# Patient Record
Sex: Male | Born: 1970 | Race: Black or African American | Hispanic: No | Marital: Single | Smoking: Current every day smoker
Health system: Southern US, Community
[De-identification: ages and names within clinical notes are randomized; demographics above are authoritative.]

## PROBLEM LIST (undated history)

## (undated) DIAGNOSIS — Z8571 Personal history of Hodgkin lymphoma: Secondary | ICD-10-CM

## (undated) DIAGNOSIS — K219 Gastro-esophageal reflux disease without esophagitis: Secondary | ICD-10-CM

## (undated) DIAGNOSIS — E785 Hyperlipidemia, unspecified: Secondary | ICD-10-CM

## (undated) DIAGNOSIS — D869 Sarcoidosis, unspecified: Secondary | ICD-10-CM

## (undated) DIAGNOSIS — I10 Essential (primary) hypertension: Secondary | ICD-10-CM

## (undated) DIAGNOSIS — N529 Male erectile dysfunction, unspecified: Secondary | ICD-10-CM

## (undated) HISTORY — PX: HERNIA REPAIR: SHX51

## (undated) HISTORY — DX: Sarcoidosis, unspecified: D86.9

## (undated) HISTORY — PX: LUNG BIOPSY: SHX232

## (undated) HISTORY — DX: Hyperlipidemia, unspecified: E78.5

## (undated) HISTORY — DX: Essential (primary) hypertension: I10

## (undated) HISTORY — DX: Male erectile dysfunction, unspecified: N52.9

## (undated) HISTORY — PX: TONSILLECTOMY: SUR1361

## (undated) HISTORY — DX: Personal history of Hodgkin lymphoma: Z85.71

---

## 1998-02-01 ENCOUNTER — Emergency Department (HOSPITAL_COMMUNITY): Admission: EM | Admit: 1998-02-01 | Discharge: 1998-02-02 | Payer: Self-pay | Admitting: Emergency Medicine

## 1999-01-23 HISTORY — PX: OTHER SURGICAL HISTORY: SHX169

## 1999-03-22 ENCOUNTER — Encounter: Payer: Self-pay | Admitting: Emergency Medicine

## 1999-03-22 ENCOUNTER — Emergency Department (HOSPITAL_COMMUNITY): Admission: EM | Admit: 1999-03-22 | Discharge: 1999-03-22 | Payer: Self-pay | Admitting: Emergency Medicine

## 1999-03-31 ENCOUNTER — Ambulatory Visit (HOSPITAL_BASED_OUTPATIENT_CLINIC_OR_DEPARTMENT_OTHER): Admission: RE | Admit: 1999-03-31 | Discharge: 1999-03-31 | Payer: Self-pay | Admitting: General Surgery

## 1999-03-31 ENCOUNTER — Encounter (INDEPENDENT_AMBULATORY_CARE_PROVIDER_SITE_OTHER): Payer: Self-pay | Admitting: *Deleted

## 1999-04-10 ENCOUNTER — Encounter: Payer: Self-pay | Admitting: Hematology & Oncology

## 1999-04-10 ENCOUNTER — Ambulatory Visit (HOSPITAL_COMMUNITY): Admission: RE | Admit: 1999-04-10 | Discharge: 1999-04-10 | Payer: Self-pay | Admitting: Hematology & Oncology

## 1999-04-11 ENCOUNTER — Encounter: Admission: RE | Admit: 1999-04-11 | Discharge: 1999-04-11 | Payer: Self-pay | Admitting: Hematology & Oncology

## 1999-04-11 ENCOUNTER — Ambulatory Visit (HOSPITAL_COMMUNITY): Admission: RE | Admit: 1999-04-11 | Discharge: 1999-04-11 | Payer: Self-pay | Admitting: Hematology & Oncology

## 1999-04-11 ENCOUNTER — Encounter: Payer: Self-pay | Admitting: Hematology & Oncology

## 1999-04-14 ENCOUNTER — Ambulatory Visit (HOSPITAL_COMMUNITY): Admission: RE | Admit: 1999-04-14 | Discharge: 1999-04-14 | Payer: Self-pay | Admitting: Hematology & Oncology

## 1999-05-02 ENCOUNTER — Encounter: Payer: Self-pay | Admitting: Hematology & Oncology

## 1999-05-02 ENCOUNTER — Ambulatory Visit (HOSPITAL_COMMUNITY): Admission: RE | Admit: 1999-05-02 | Discharge: 1999-05-02 | Payer: Self-pay | Admitting: Hematology & Oncology

## 1999-05-27 ENCOUNTER — Encounter: Payer: Self-pay | Admitting: Emergency Medicine

## 1999-05-27 ENCOUNTER — Emergency Department (HOSPITAL_COMMUNITY): Admission: EM | Admit: 1999-05-27 | Discharge: 1999-05-27 | Payer: Self-pay | Admitting: Emergency Medicine

## 1999-06-11 ENCOUNTER — Inpatient Hospital Stay (HOSPITAL_COMMUNITY): Admission: EM | Admit: 1999-06-11 | Discharge: 1999-06-14 | Payer: Self-pay | Admitting: Emergency Medicine

## 1999-06-11 ENCOUNTER — Encounter: Payer: Self-pay | Admitting: Hematology and Oncology

## 1999-06-17 ENCOUNTER — Encounter: Payer: Self-pay | Admitting: Hematology & Oncology

## 1999-06-18 ENCOUNTER — Inpatient Hospital Stay (HOSPITAL_COMMUNITY): Admission: EM | Admit: 1999-06-18 | Discharge: 1999-06-28 | Payer: Self-pay

## 1999-06-23 ENCOUNTER — Encounter: Payer: Self-pay | Admitting: Hematology & Oncology

## 1999-06-28 ENCOUNTER — Encounter: Payer: Self-pay | Admitting: Hematology & Oncology

## 1999-07-05 ENCOUNTER — Encounter (INDEPENDENT_AMBULATORY_CARE_PROVIDER_SITE_OTHER): Payer: Self-pay | Admitting: *Deleted

## 1999-07-05 ENCOUNTER — Ambulatory Visit (HOSPITAL_COMMUNITY): Admission: RE | Admit: 1999-07-05 | Discharge: 1999-07-06 | Payer: Self-pay | Admitting: *Deleted

## 1999-07-15 ENCOUNTER — Emergency Department (HOSPITAL_COMMUNITY): Admission: EM | Admit: 1999-07-15 | Discharge: 1999-07-15 | Payer: Self-pay | Admitting: Emergency Medicine

## 1999-09-18 ENCOUNTER — Ambulatory Visit (HOSPITAL_COMMUNITY): Admission: RE | Admit: 1999-09-18 | Discharge: 1999-09-18 | Payer: Self-pay | Admitting: Hematology & Oncology

## 1999-09-18 ENCOUNTER — Encounter: Payer: Self-pay | Admitting: Hematology & Oncology

## 1999-09-26 ENCOUNTER — Inpatient Hospital Stay (HOSPITAL_COMMUNITY): Admission: AD | Admit: 1999-09-26 | Discharge: 1999-09-29 | Payer: Self-pay | Admitting: Oncology

## 1999-09-26 ENCOUNTER — Encounter: Payer: Self-pay | Admitting: Hematology & Oncology

## 1999-10-01 ENCOUNTER — Ambulatory Visit (HOSPITAL_COMMUNITY): Admission: RE | Admit: 1999-10-01 | Discharge: 1999-10-01 | Payer: Self-pay | Admitting: Hematology & Oncology

## 1999-10-04 ENCOUNTER — Encounter: Admission: RE | Admit: 1999-10-04 | Discharge: 2000-01-02 | Payer: Self-pay | Admitting: Radiation Oncology

## 1999-12-07 ENCOUNTER — Emergency Department (HOSPITAL_COMMUNITY): Admission: EM | Admit: 1999-12-07 | Discharge: 1999-12-07 | Payer: Self-pay | Admitting: Emergency Medicine

## 1999-12-07 ENCOUNTER — Encounter: Payer: Self-pay | Admitting: Emergency Medicine

## 1999-12-21 ENCOUNTER — Encounter: Payer: Self-pay | Admitting: Hematology & Oncology

## 1999-12-21 ENCOUNTER — Ambulatory Visit (HOSPITAL_COMMUNITY): Admission: RE | Admit: 1999-12-21 | Discharge: 1999-12-21 | Payer: Self-pay | Admitting: Hematology & Oncology

## 1999-12-25 ENCOUNTER — Ambulatory Visit (HOSPITAL_COMMUNITY): Admission: RE | Admit: 1999-12-25 | Discharge: 1999-12-25 | Payer: Self-pay | Admitting: Hematology & Oncology

## 1999-12-25 ENCOUNTER — Encounter: Payer: Self-pay | Admitting: Hematology & Oncology

## 2000-02-22 ENCOUNTER — Ambulatory Visit (HOSPITAL_COMMUNITY): Admission: RE | Admit: 2000-02-22 | Discharge: 2000-02-22 | Payer: Self-pay | Admitting: Hematology & Oncology

## 2000-02-22 ENCOUNTER — Encounter: Payer: Self-pay | Admitting: Hematology & Oncology

## 2000-05-07 ENCOUNTER — Emergency Department (HOSPITAL_COMMUNITY): Admission: EM | Admit: 2000-05-07 | Discharge: 2000-05-07 | Payer: Self-pay | Admitting: Emergency Medicine

## 2000-05-07 ENCOUNTER — Encounter: Payer: Self-pay | Admitting: Emergency Medicine

## 2001-04-04 ENCOUNTER — Encounter: Payer: Self-pay | Admitting: Emergency Medicine

## 2001-04-04 ENCOUNTER — Emergency Department (HOSPITAL_COMMUNITY): Admission: EM | Admit: 2001-04-04 | Discharge: 2001-04-04 | Payer: Self-pay | Admitting: Emergency Medicine

## 2001-08-08 ENCOUNTER — Encounter: Payer: Self-pay | Admitting: Emergency Medicine

## 2001-08-08 ENCOUNTER — Emergency Department (HOSPITAL_COMMUNITY): Admission: EM | Admit: 2001-08-08 | Discharge: 2001-08-08 | Payer: Self-pay | Admitting: Emergency Medicine

## 2001-10-11 ENCOUNTER — Emergency Department (HOSPITAL_COMMUNITY): Admission: EM | Admit: 2001-10-11 | Discharge: 2001-10-11 | Payer: Self-pay | Admitting: Emergency Medicine

## 2003-03-11 ENCOUNTER — Emergency Department (HOSPITAL_COMMUNITY): Admission: EM | Admit: 2003-03-11 | Discharge: 2003-03-11 | Payer: Self-pay | Admitting: Emergency Medicine

## 2003-06-14 ENCOUNTER — Emergency Department (HOSPITAL_COMMUNITY): Admission: EM | Admit: 2003-06-14 | Discharge: 2003-06-14 | Payer: Self-pay | Admitting: Emergency Medicine

## 2003-07-20 ENCOUNTER — Ambulatory Visit (HOSPITAL_COMMUNITY): Admission: RE | Admit: 2003-07-20 | Discharge: 2003-07-20 | Payer: Self-pay | Admitting: Hematology & Oncology

## 2003-10-26 ENCOUNTER — Emergency Department (HOSPITAL_COMMUNITY): Admission: EM | Admit: 2003-10-26 | Discharge: 2003-10-26 | Payer: Self-pay | Admitting: Emergency Medicine

## 2004-02-16 ENCOUNTER — Emergency Department (HOSPITAL_COMMUNITY): Admission: EM | Admit: 2004-02-16 | Discharge: 2004-02-17 | Payer: Self-pay

## 2004-09-12 ENCOUNTER — Emergency Department (HOSPITAL_COMMUNITY): Admission: EM | Admit: 2004-09-12 | Discharge: 2004-09-12 | Payer: Self-pay | Admitting: Emergency Medicine

## 2004-09-15 ENCOUNTER — Ambulatory Visit (HOSPITAL_COMMUNITY): Admission: RE | Admit: 2004-09-15 | Discharge: 2004-09-15 | Payer: Self-pay | Admitting: Hematology & Oncology

## 2005-01-31 ENCOUNTER — Emergency Department (HOSPITAL_COMMUNITY): Admission: EM | Admit: 2005-01-31 | Discharge: 2005-01-31 | Payer: Self-pay | Admitting: Family Medicine

## 2005-03-28 ENCOUNTER — Emergency Department (HOSPITAL_COMMUNITY): Admission: EM | Admit: 2005-03-28 | Discharge: 2005-03-28 | Payer: Self-pay | Admitting: Family Medicine

## 2005-04-02 ENCOUNTER — Ambulatory Visit: Payer: Self-pay | Admitting: Hematology & Oncology

## 2005-05-20 ENCOUNTER — Emergency Department (HOSPITAL_COMMUNITY): Admission: EM | Admit: 2005-05-20 | Discharge: 2005-05-20 | Payer: Self-pay | Admitting: Emergency Medicine

## 2005-10-30 ENCOUNTER — Emergency Department (HOSPITAL_COMMUNITY): Admission: EM | Admit: 2005-10-30 | Discharge: 2005-10-30 | Payer: Self-pay | Admitting: Family Medicine

## 2005-12-18 ENCOUNTER — Emergency Department (HOSPITAL_COMMUNITY): Admission: EM | Admit: 2005-12-18 | Discharge: 2005-12-19 | Payer: Self-pay | Admitting: Emergency Medicine

## 2006-01-05 ENCOUNTER — Emergency Department (HOSPITAL_COMMUNITY): Admission: EM | Admit: 2006-01-05 | Discharge: 2006-01-05 | Payer: Self-pay | Admitting: Family Medicine

## 2006-03-15 ENCOUNTER — Ambulatory Visit: Admission: RE | Admit: 2006-03-15 | Discharge: 2006-03-15 | Payer: Self-pay | Admitting: Family Medicine

## 2006-03-15 ENCOUNTER — Ambulatory Visit: Payer: Self-pay | Admitting: Vascular Surgery

## 2006-03-15 ENCOUNTER — Emergency Department (HOSPITAL_COMMUNITY): Admission: EM | Admit: 2006-03-15 | Discharge: 2006-03-15 | Payer: Self-pay | Admitting: Family Medicine

## 2006-03-21 ENCOUNTER — Emergency Department (HOSPITAL_COMMUNITY): Admission: EM | Admit: 2006-03-21 | Discharge: 2006-03-22 | Payer: Self-pay | Admitting: Emergency Medicine

## 2006-03-24 ENCOUNTER — Emergency Department (HOSPITAL_COMMUNITY): Admission: EM | Admit: 2006-03-24 | Discharge: 2006-03-24 | Payer: Self-pay | Admitting: Family Medicine

## 2006-04-24 ENCOUNTER — Emergency Department (HOSPITAL_COMMUNITY): Admission: EM | Admit: 2006-04-24 | Discharge: 2006-04-24 | Payer: Self-pay | Admitting: Emergency Medicine

## 2006-07-20 ENCOUNTER — Emergency Department (HOSPITAL_COMMUNITY): Admission: EM | Admit: 2006-07-20 | Discharge: 2006-07-21 | Payer: Self-pay | Admitting: Emergency Medicine

## 2006-09-01 ENCOUNTER — Emergency Department (HOSPITAL_COMMUNITY): Admission: EM | Admit: 2006-09-01 | Discharge: 2006-09-01 | Payer: Self-pay | Admitting: Family Medicine

## 2006-09-05 ENCOUNTER — Ambulatory Visit: Payer: Self-pay | Admitting: Family Medicine

## 2007-02-03 ENCOUNTER — Emergency Department (HOSPITAL_COMMUNITY): Admission: EM | Admit: 2007-02-03 | Discharge: 2007-02-04 | Payer: Self-pay | Admitting: Emergency Medicine

## 2007-07-08 ENCOUNTER — Emergency Department (HOSPITAL_COMMUNITY): Admission: EM | Admit: 2007-07-08 | Discharge: 2007-07-08 | Payer: Self-pay | Admitting: Family Medicine

## 2007-10-03 ENCOUNTER — Inpatient Hospital Stay (HOSPITAL_COMMUNITY): Admission: EM | Admit: 2007-10-03 | Discharge: 2007-10-10 | Payer: Self-pay | Admitting: Emergency Medicine

## 2007-10-04 ENCOUNTER — Ambulatory Visit: Payer: Self-pay | Admitting: Oncology

## 2007-10-06 ENCOUNTER — Encounter (INDEPENDENT_AMBULATORY_CARE_PROVIDER_SITE_OTHER): Payer: Self-pay | Admitting: Emergency Medicine

## 2007-10-07 ENCOUNTER — Encounter (HOSPITAL_COMMUNITY): Payer: Self-pay | Admitting: Oncology

## 2007-10-07 ENCOUNTER — Ambulatory Visit: Payer: Self-pay | Admitting: Hematology & Oncology

## 2007-10-08 ENCOUNTER — Ambulatory Visit: Payer: Self-pay | Admitting: Thoracic Surgery

## 2007-10-09 ENCOUNTER — Encounter: Payer: Self-pay | Admitting: Thoracic Surgery

## 2007-10-16 ENCOUNTER — Ambulatory Visit: Payer: Self-pay | Admitting: Hematology & Oncology

## 2007-10-17 LAB — CBC WITH DIFFERENTIAL (CANCER CENTER ONLY)
BASO%: 0.5 % (ref 0.0–2.0)
HCT: 30.8 % — ABNORMAL LOW (ref 38.7–49.9)
LYMPH#: 1.7 10*3/uL (ref 0.9–3.3)
MONO#: 0.5 10*3/uL (ref 0.1–0.9)
NEUT%: 68.2 % (ref 40.0–80.0)
Platelets: 17 10*3/uL — ABNORMAL LOW (ref 145–400)
RDW: 12.9 % (ref 10.5–14.6)
WBC: 7.5 10*3/uL (ref 4.0–10.0)

## 2007-10-17 LAB — COMPREHENSIVE METABOLIC PANEL
ALT: 42 U/L (ref 0–53)
Albumin: 4.6 g/dL (ref 3.5–5.2)
Alkaline Phosphatase: 153 U/L — ABNORMAL HIGH (ref 39–117)
CO2: 25 mEq/L (ref 19–32)
Glucose, Bld: 105 mg/dL — ABNORMAL HIGH (ref 70–99)
Potassium: 3.9 mEq/L (ref 3.5–5.3)
Sodium: 140 mEq/L (ref 135–145)
Total Bilirubin: 2.9 mg/dL — ABNORMAL HIGH (ref 0.3–1.2)
Total Protein: 7.7 g/dL (ref 6.0–8.3)

## 2007-10-17 LAB — CHCC SATELLITE - SMEAR

## 2007-10-18 LAB — DIRECT ANTIGLOBULIN TEST (NOT AT ARMC): DAT (Complement): NEGATIVE

## 2007-10-23 ENCOUNTER — Ambulatory Visit: Payer: Self-pay | Admitting: Internal Medicine

## 2007-10-23 DIAGNOSIS — D869 Sarcoidosis, unspecified: Secondary | ICD-10-CM

## 2007-10-23 DIAGNOSIS — R079 Chest pain, unspecified: Secondary | ICD-10-CM

## 2007-10-24 ENCOUNTER — Inpatient Hospital Stay (HOSPITAL_COMMUNITY): Admission: EM | Admit: 2007-10-24 | Discharge: 2007-10-31 | Payer: Self-pay | Admitting: Emergency Medicine

## 2007-10-24 LAB — CBC WITH DIFFERENTIAL (CANCER CENTER ONLY)
BASO#: 0.1 10*3/uL (ref 0.0–0.2)
EOS%: 1.1 % (ref 0.0–7.0)
Eosinophils Absolute: 0.2 10*3/uL (ref 0.0–0.5)
HCT: 27.5 % — ABNORMAL LOW (ref 38.7–49.9)
HGB: 9.4 g/dL — ABNORMAL LOW (ref 13.0–17.1)
MCH: 31 pg (ref 28.0–33.4)
MCHC: 34.4 g/dL (ref 32.0–35.9)
MONO%: 3 % (ref 0.0–13.0)
NEUT%: 89.3 % — ABNORMAL HIGH (ref 40.0–80.0)

## 2007-10-24 LAB — RETICULOCYTES (CHCC): Retic Ct Pct: 10.9 % — ABNORMAL HIGH (ref 0.4–3.1)

## 2007-11-03 LAB — CBC WITH DIFFERENTIAL (CANCER CENTER ONLY)
BASO#: 0 10*3/uL (ref 0.0–0.2)
EOS%: 1.8 % (ref 0.0–7.0)
HGB: 11.9 g/dL — ABNORMAL LOW (ref 13.0–17.1)
LYMPH%: 13.7 % — ABNORMAL LOW (ref 14.0–48.0)
MCH: 31.6 pg (ref 28.0–33.4)
MCHC: 34.1 g/dL (ref 32.0–35.9)
MCV: 93 fL (ref 82–98)
MONO%: 8.9 % (ref 0.0–13.0)
NEUT#: 5.3 10*3/uL (ref 1.5–6.5)
Platelets: 367 10*3/uL (ref 145–400)

## 2007-11-03 LAB — LACTATE DEHYDROGENASE: LDH: 193 U/L (ref 94–250)

## 2007-11-03 LAB — CHCC SATELLITE - SMEAR

## 2007-11-03 LAB — COMPREHENSIVE METABOLIC PANEL
ALT: 30 U/L (ref 0–53)
CO2: 24 mEq/L (ref 19–32)
Creatinine, Ser: 1.17 mg/dL (ref 0.40–1.50)
Total Bilirubin: 1.2 mg/dL (ref 0.3–1.2)

## 2007-11-03 LAB — RETICULOCYTES (CHCC): Retic Ct Pct: 5.3 % — ABNORMAL HIGH (ref 0.4–3.1)

## 2007-11-11 LAB — LACTATE DEHYDROGENASE: LDH: 169 U/L (ref 94–250)

## 2007-11-11 LAB — CBC WITH DIFFERENTIAL (CANCER CENTER ONLY)
BASO%: 0.4 % (ref 0.0–2.0)
LYMPH%: 20.2 % (ref 14.0–48.0)
MCV: 91 fL (ref 82–98)
MONO#: 0.3 10*3/uL (ref 0.1–0.9)
Platelets: 227 10*3/uL (ref 145–400)
RDW: 12.9 % (ref 10.5–14.6)
WBC: 4.9 10*3/uL (ref 4.0–10.0)

## 2007-11-11 LAB — RETICULOCYTES (CHCC)
ABS Retic: 174.3 10*3/uL (ref 19.0–186.0)
Retic Ct Pct: 4.6 % — ABNORMAL HIGH (ref 0.4–3.1)

## 2007-11-11 LAB — BASIC METABOLIC PANEL
BUN: 14 mg/dL (ref 6–23)
Chloride: 105 mEq/L (ref 96–112)
Potassium: 3.6 mEq/L (ref 3.5–5.3)

## 2007-11-11 LAB — CHCC SATELLITE - SMEAR

## 2007-11-14 ENCOUNTER — Encounter: Payer: Self-pay | Admitting: Thoracic Surgery

## 2007-11-14 ENCOUNTER — Ambulatory Visit: Payer: Self-pay | Admitting: Thoracic Surgery

## 2007-11-14 ENCOUNTER — Ambulatory Visit: Payer: Self-pay | Admitting: Emergency Medicine

## 2007-11-14 ENCOUNTER — Ambulatory Visit (HOSPITAL_COMMUNITY): Admission: RE | Admit: 2007-11-14 | Discharge: 2007-11-14 | Payer: Self-pay | Admitting: Thoracic Surgery

## 2007-11-18 ENCOUNTER — Ambulatory Visit: Payer: Self-pay | Admitting: Thoracic Surgery

## 2007-12-10 ENCOUNTER — Ambulatory Visit: Payer: Self-pay | Admitting: Hematology & Oncology

## 2007-12-14 ENCOUNTER — Inpatient Hospital Stay (HOSPITAL_COMMUNITY): Admission: EM | Admit: 2007-12-14 | Discharge: 2007-12-19 | Payer: Self-pay | Admitting: Emergency Medicine

## 2007-12-15 ENCOUNTER — Encounter (INDEPENDENT_AMBULATORY_CARE_PROVIDER_SITE_OTHER): Payer: Self-pay | Admitting: Internal Medicine

## 2007-12-16 ENCOUNTER — Ambulatory Visit: Payer: Self-pay | Admitting: Hematology & Oncology

## 2007-12-25 ENCOUNTER — Encounter: Payer: Self-pay | Admitting: Internal Medicine

## 2007-12-25 LAB — CBC WITH DIFFERENTIAL (CANCER CENTER ONLY)
BASO#: 0 10*3/uL (ref 0.0–0.2)
Eosinophils Absolute: 0.2 10*3/uL (ref 0.0–0.5)
HCT: 37.3 % — ABNORMAL LOW (ref 38.7–49.9)
HGB: 12.7 g/dL — ABNORMAL LOW (ref 13.0–17.1)
LYMPH#: 1.4 10*3/uL (ref 0.9–3.3)
MCHC: 34 g/dL (ref 32.0–35.9)
MONO#: 0.4 10*3/uL (ref 0.1–0.9)
NEUT%: 54.1 % (ref 40.0–80.0)
RBC: 4.32 10*6/uL (ref 4.20–5.70)
WBC: 4.6 10*3/uL (ref 4.0–10.0)

## 2007-12-25 LAB — CHCC SATELLITE - SMEAR

## 2007-12-25 LAB — RETICULOCYTES (CHCC)
ABS Retic: 61.6 10*3/uL (ref 19.0–186.0)
RBC.: 4.4 MIL/uL (ref 4.22–5.81)
Retic Ct Pct: 1.4 % (ref 0.4–3.1)

## 2007-12-31 ENCOUNTER — Ambulatory Visit: Payer: Self-pay | Admitting: Thoracic Surgery

## 2007-12-31 ENCOUNTER — Emergency Department (HOSPITAL_COMMUNITY): Admission: EM | Admit: 2007-12-31 | Discharge: 2007-12-31 | Payer: Self-pay | Admitting: Emergency Medicine

## 2008-01-04 ENCOUNTER — Inpatient Hospital Stay (HOSPITAL_COMMUNITY): Admission: EM | Admit: 2008-01-04 | Discharge: 2008-01-15 | Payer: Self-pay | Admitting: Emergency Medicine

## 2008-01-04 ENCOUNTER — Ambulatory Visit: Payer: Self-pay | Admitting: Thoracic Surgery

## 2008-01-04 ENCOUNTER — Ambulatory Visit: Payer: Self-pay | Admitting: Pulmonary Disease

## 2008-01-04 ENCOUNTER — Ambulatory Visit: Payer: Self-pay | Admitting: Cardiology

## 2008-01-04 ENCOUNTER — Ambulatory Visit: Payer: Self-pay | Admitting: Internal Medicine

## 2008-01-06 ENCOUNTER — Encounter: Payer: Self-pay | Admitting: Internal Medicine

## 2008-01-08 ENCOUNTER — Encounter: Payer: Self-pay | Admitting: Thoracic Surgery

## 2008-01-13 ENCOUNTER — Encounter (INDEPENDENT_AMBULATORY_CARE_PROVIDER_SITE_OTHER): Payer: Self-pay | Admitting: Internal Medicine

## 2008-01-14 ENCOUNTER — Ambulatory Visit: Payer: Self-pay | Admitting: Infectious Diseases

## 2008-01-27 ENCOUNTER — Encounter: Admission: RE | Admit: 2008-01-27 | Discharge: 2008-01-27 | Payer: Self-pay | Admitting: Thoracic Surgery

## 2008-01-27 ENCOUNTER — Ambulatory Visit: Payer: Self-pay | Admitting: Thoracic Surgery

## 2008-01-28 ENCOUNTER — Ambulatory Visit: Payer: Self-pay | Admitting: Hematology & Oncology

## 2008-02-02 ENCOUNTER — Telehealth: Payer: Self-pay | Admitting: Pulmonary Disease

## 2008-02-03 ENCOUNTER — Ambulatory Visit: Payer: Self-pay | Admitting: Pulmonary Disease

## 2008-02-03 DIAGNOSIS — E119 Type 2 diabetes mellitus without complications: Secondary | ICD-10-CM | POA: Insufficient documentation

## 2008-02-03 DIAGNOSIS — C8190 Hodgkin lymphoma, unspecified, unspecified site: Secondary | ICD-10-CM | POA: Insufficient documentation

## 2008-02-09 ENCOUNTER — Ambulatory Visit: Payer: Self-pay | Admitting: Family Medicine

## 2008-03-02 ENCOUNTER — Ambulatory Visit: Payer: Self-pay | Admitting: Thoracic Surgery

## 2008-03-02 ENCOUNTER — Encounter: Admission: RE | Admit: 2008-03-02 | Discharge: 2008-03-02 | Payer: Self-pay | Admitting: Thoracic Surgery

## 2008-05-05 ENCOUNTER — Ambulatory Visit: Payer: Self-pay | Admitting: Family Medicine

## 2008-08-15 ENCOUNTER — Ambulatory Visit: Payer: Self-pay | Admitting: Cardiology

## 2008-08-15 ENCOUNTER — Observation Stay (HOSPITAL_COMMUNITY): Admission: EM | Admit: 2008-08-15 | Discharge: 2008-08-18 | Payer: Self-pay | Admitting: Emergency Medicine

## 2008-08-17 ENCOUNTER — Encounter: Payer: Self-pay | Admitting: Cardiovascular Disease

## 2008-08-25 ENCOUNTER — Encounter: Payer: Self-pay | Admitting: Cardiology

## 2008-11-21 ENCOUNTER — Emergency Department (HOSPITAL_COMMUNITY): Admission: EM | Admit: 2008-11-21 | Discharge: 2008-11-21 | Payer: Self-pay | Admitting: Emergency Medicine

## 2008-11-22 ENCOUNTER — Ambulatory Visit: Payer: Self-pay | Admitting: Family Medicine

## 2008-12-29 ENCOUNTER — Ambulatory Visit: Payer: Self-pay | Admitting: Family Medicine

## 2008-12-29 ENCOUNTER — Encounter: Admission: RE | Admit: 2008-12-29 | Discharge: 2008-12-29 | Payer: Self-pay | Admitting: Family Medicine

## 2009-01-26 ENCOUNTER — Ambulatory Visit: Payer: Self-pay | Admitting: Family Medicine

## 2009-05-02 ENCOUNTER — Ambulatory Visit: Payer: Self-pay | Admitting: Family Medicine

## 2009-07-13 ENCOUNTER — Emergency Department (HOSPITAL_COMMUNITY): Admission: EM | Admit: 2009-07-13 | Discharge: 2009-07-13 | Payer: Self-pay | Admitting: Emergency Medicine

## 2009-11-19 IMAGING — CR DG CHEST 1V PORT
1 series · 1 of 1 positions shown · non-contrast
Comparison: 11/13/2007 study

CLINICAL DATA: History is given of lung resection and left VATS.
History given of central line insertion and chest tube placement.

PORTABLE CHEST - 1 VIEW

[view not recorded]
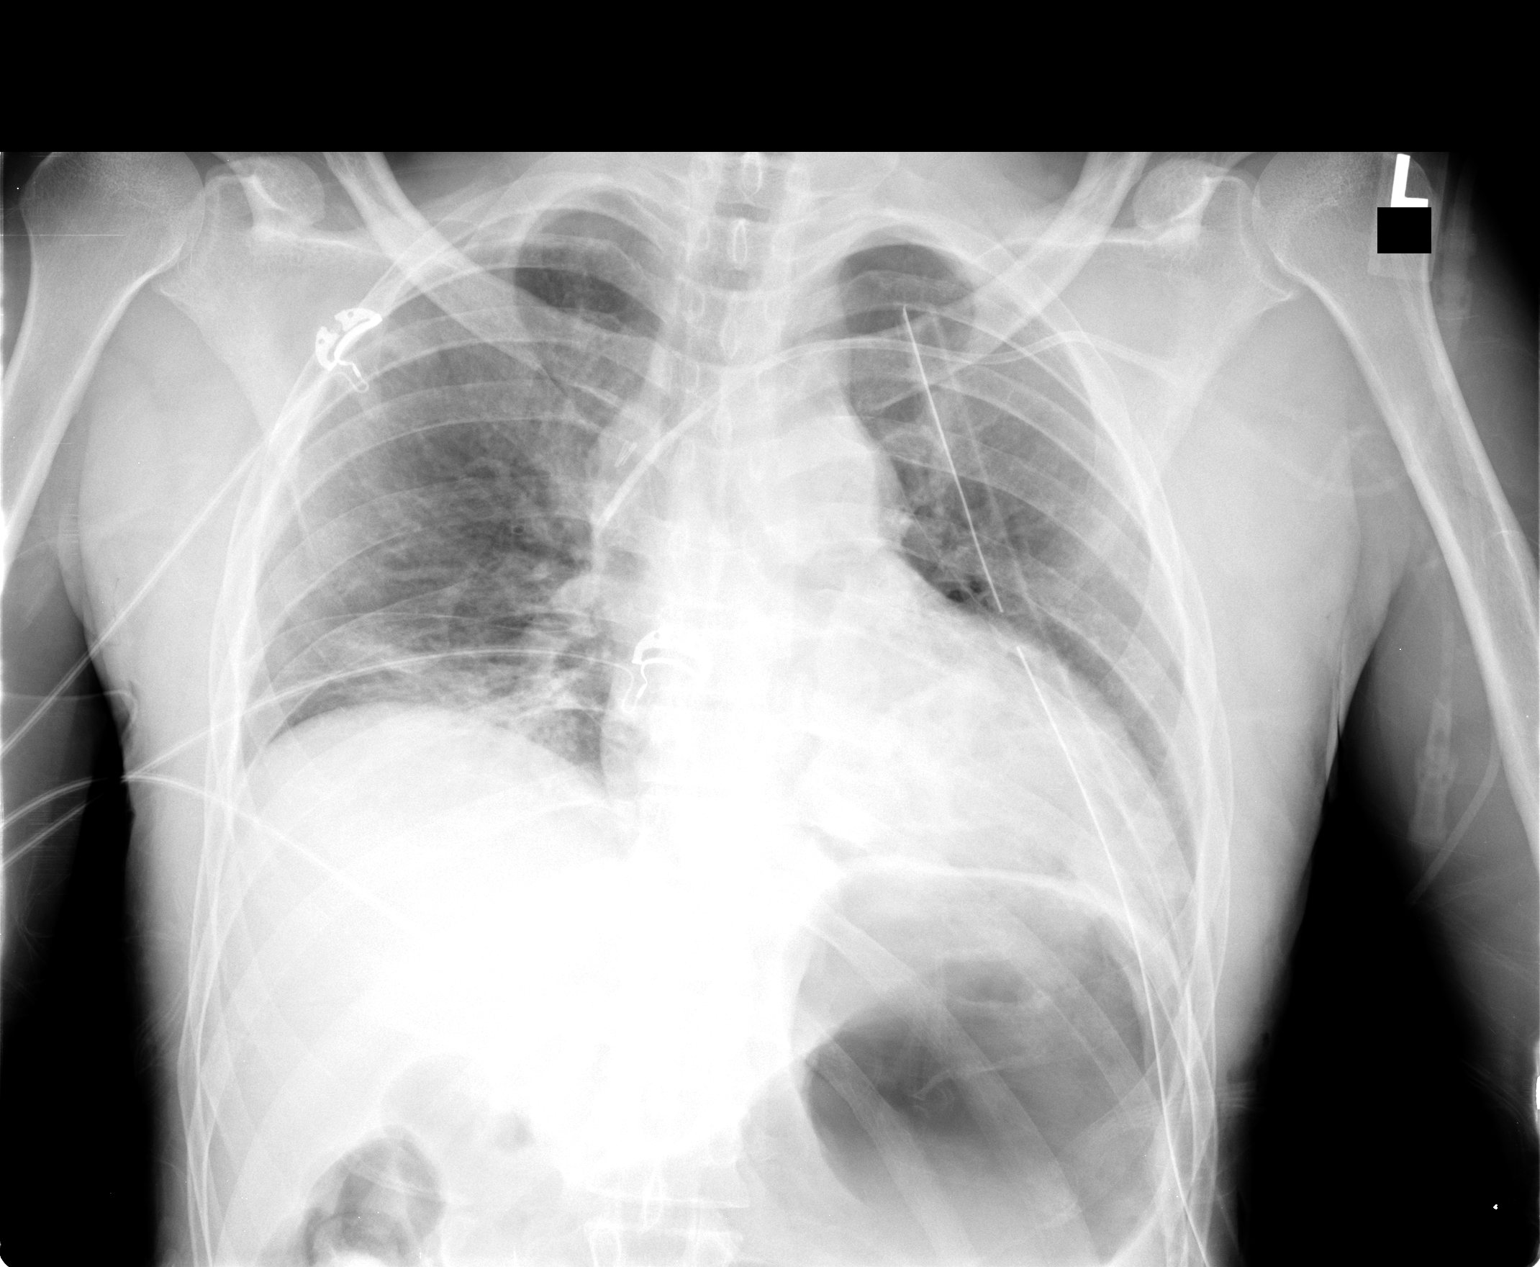

[1 of 1 positions shown; findings below may reference images not displayed]

FINDINGS: There is gaseous distention of the stomach.  There is
moderate enlargement of the cardiac silhouette.  A venous catheter
enters from a left brachiocephalic approach.  Its tip is in the
proximal superior vena cava.  A left chest tube is in place.  There
is a tiny left apical pneumothorax.  No pleural effusion is
evident.  There is mild basilar atelectasis and infiltrate on the
right with elevation of the right hemidiaphragm. There is mild
atelectasis and hazy infiltrative density in the retrocardiac
region on the left medially.
IMPRESSION: A left central venous catheter and a left chest tube are in place.
There is a tiny left apical pneumothorax.  There is moderate
enlargement the cardiac silhouette.  There is mild basilar
atelectasis and infiltrative CT on the right.  There is slight
haziness in the medial left lung base.

## 2009-11-23 IMAGING — CR DG CHEST 2V
2 series · 2 of 2 positions shown · non-contrast
Comparison: 01/11/2008

CLINICAL DATA: Chest pain.

CHEST - 2 VIEW

[w chest pa]
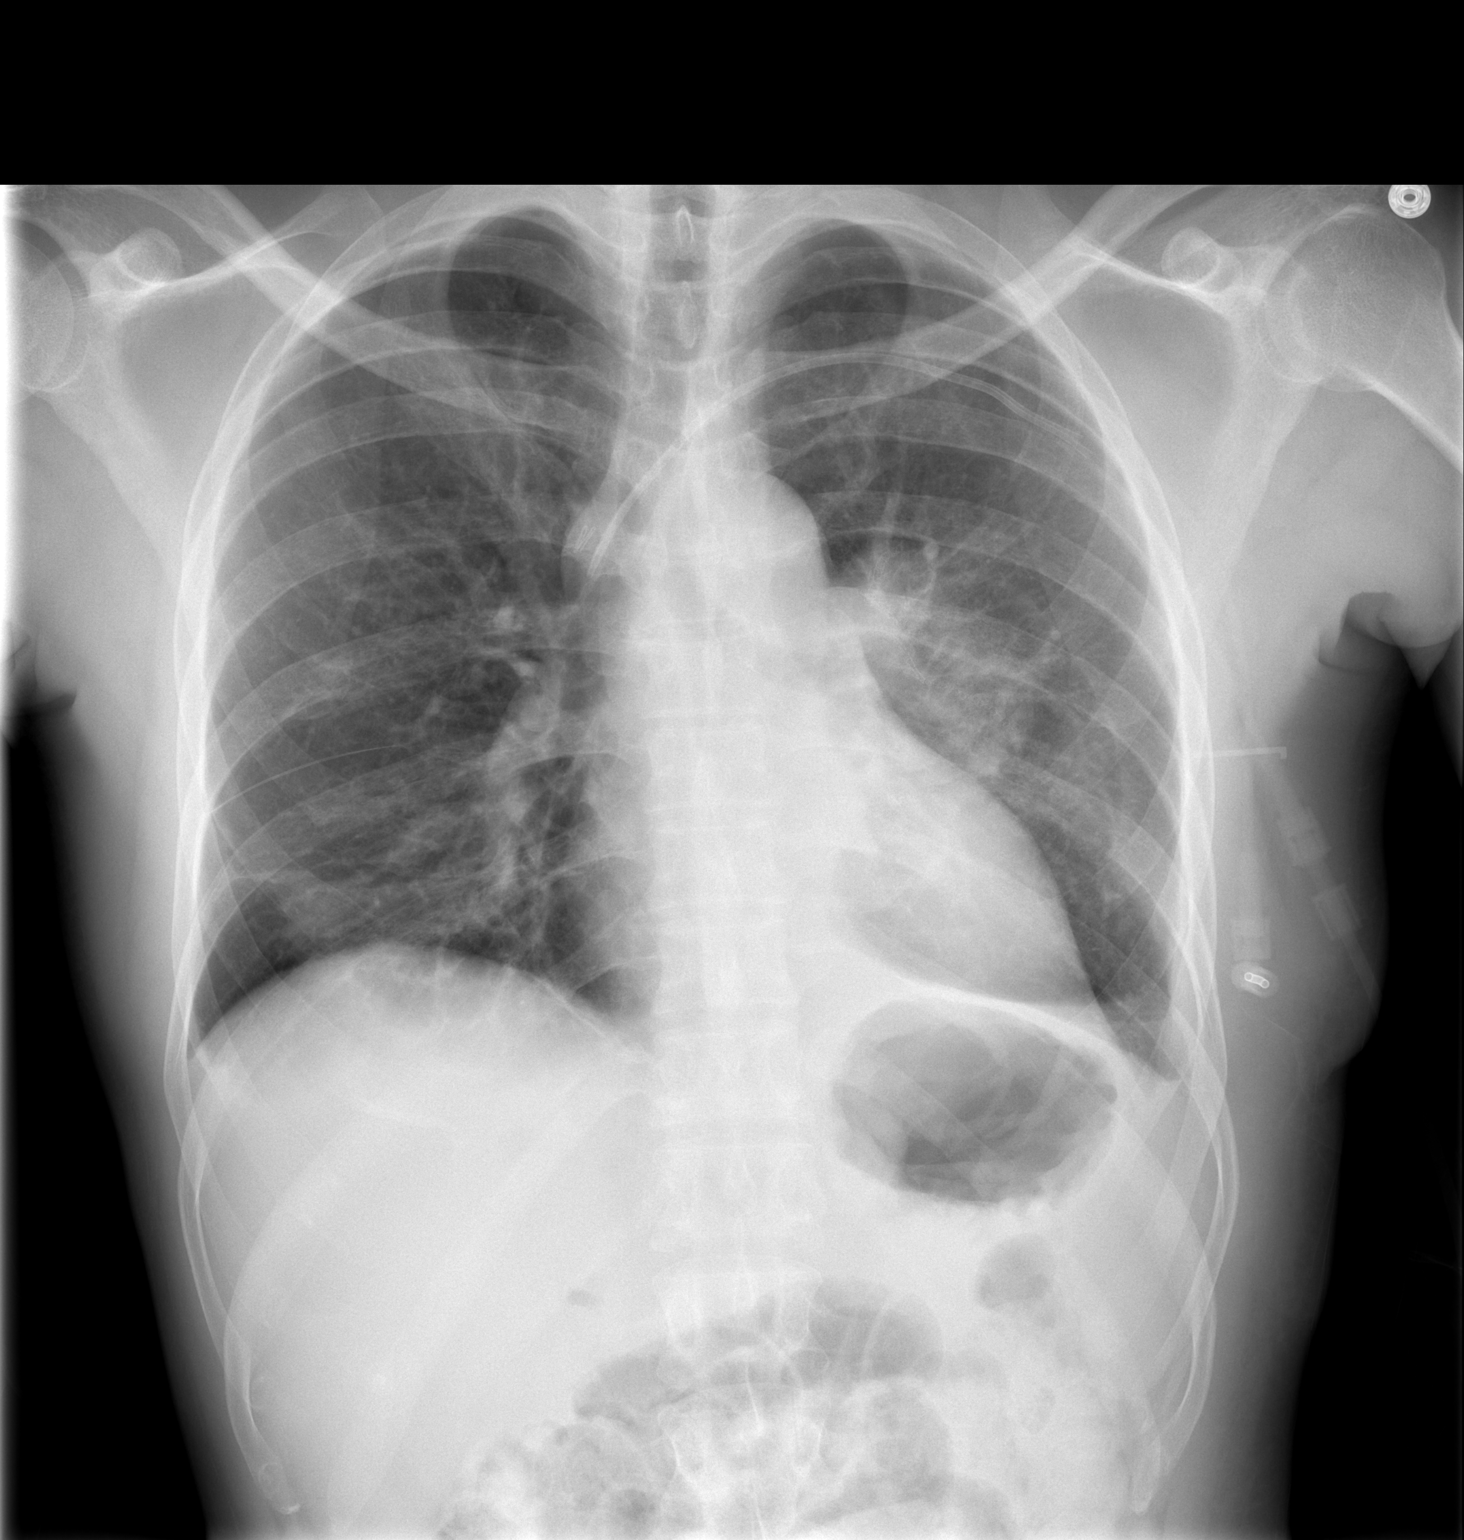

[w chest lat]
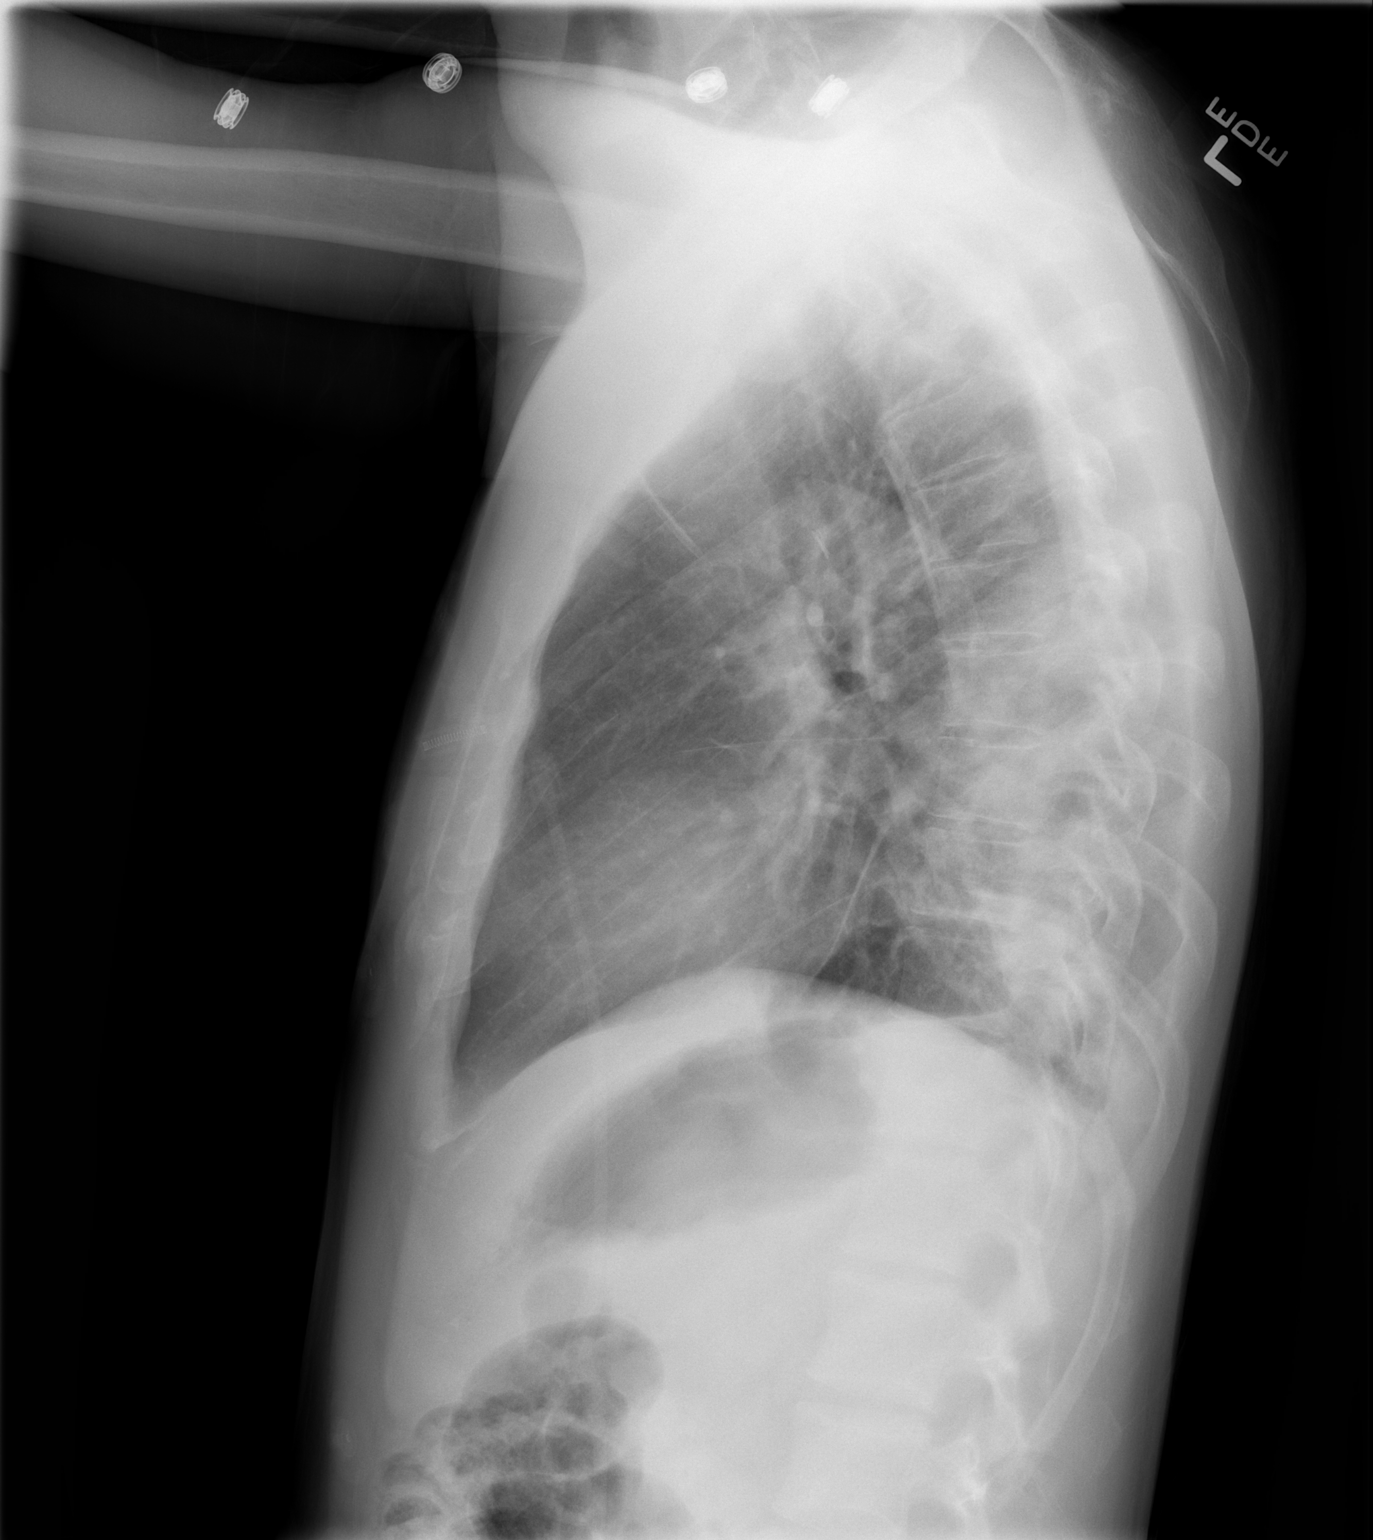

[2 of 2 positions shown; findings below may reference images not displayed]

FINDINGS: The heart size and vascularity are normal.  There is no
pneumothorax.  Central venous catheter tip is in the superior vena
cava.

There is an ill-defined area of density seen posteriorly in the
left hemithorax, most likely  loculated pleural fluid.  There is a
tiny right effusion loculated posteriorly.  Compared to the prior
exam this has not changed.
IMPRESSION: No change in the appearance of the chest since the prior study.
Posterior density in the left hemithorax most likely represents
loculated fluid.

## 2009-11-24 IMAGING — CR DG CHEST 2V
2 series · 2 of 2 positions shown · non-contrast
Comparison: 01/12/2008

CLINICAL DATA: Chest pain

CHEST - 2 VIEW

[view not recorded (1 of 2)]
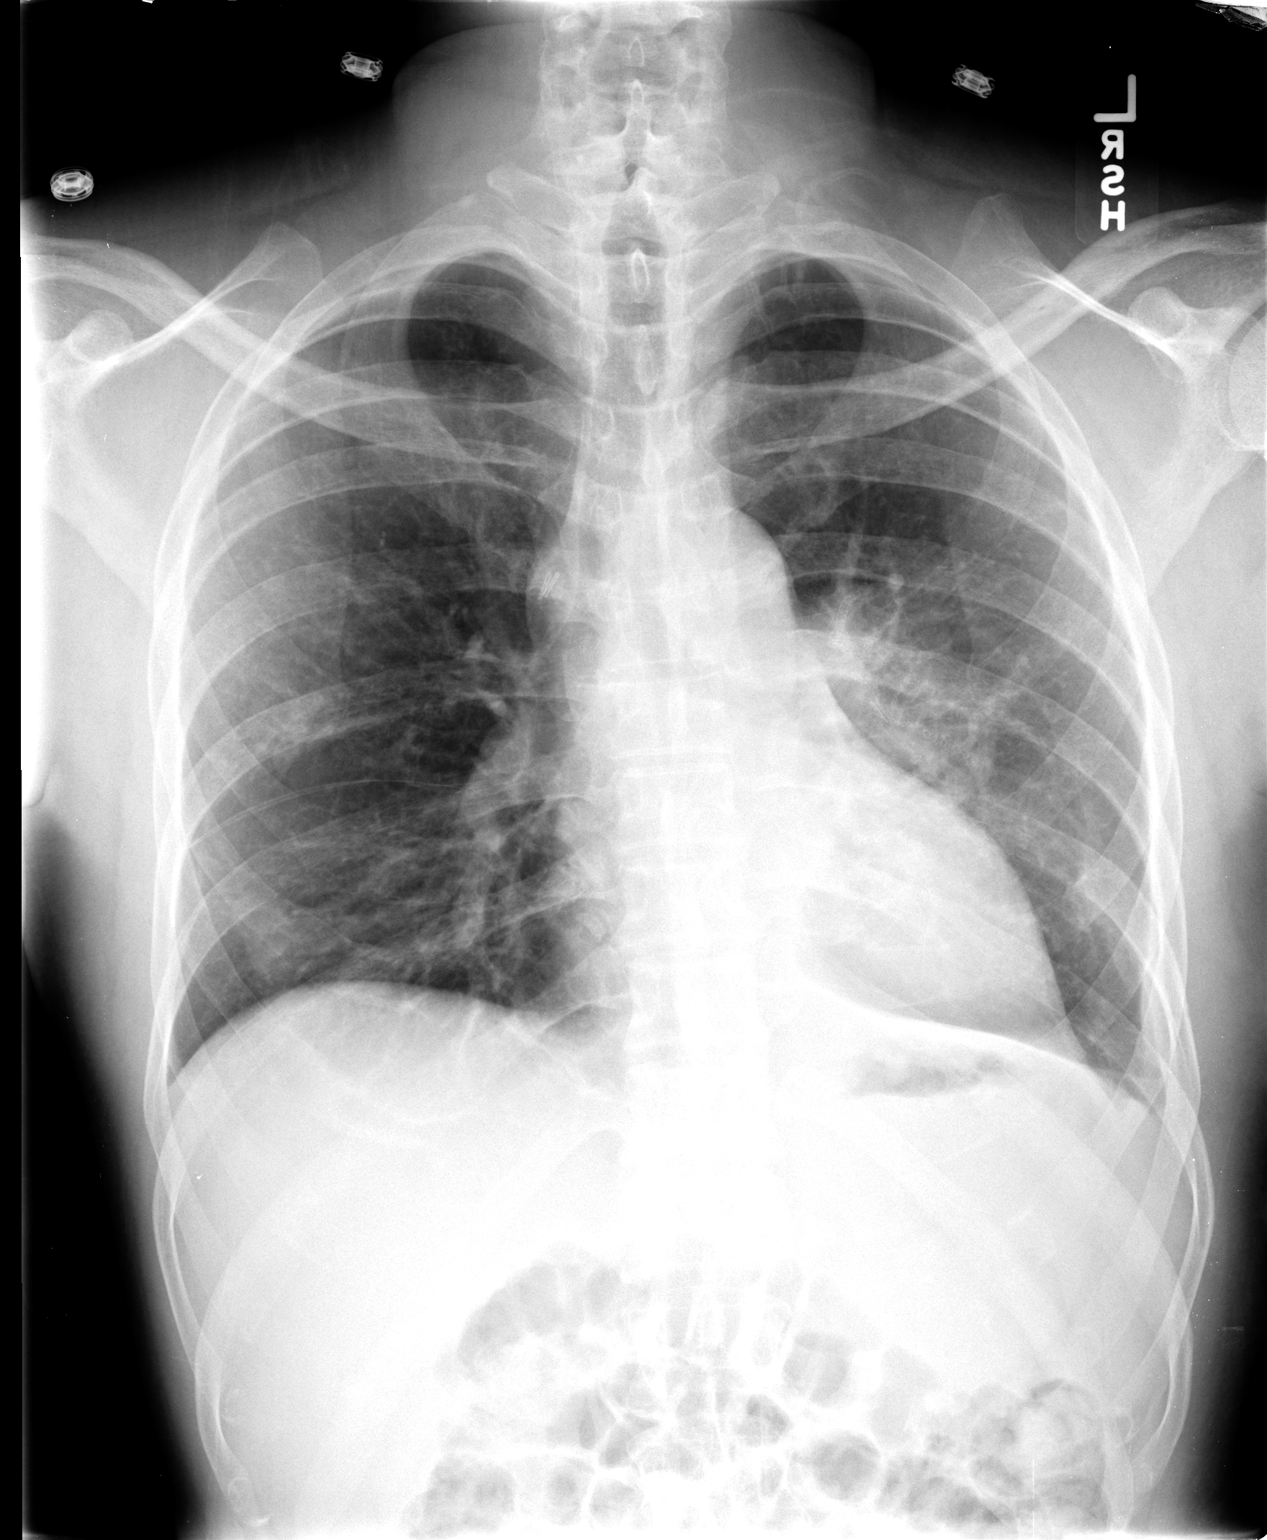

[view not recorded (2 of 2)]
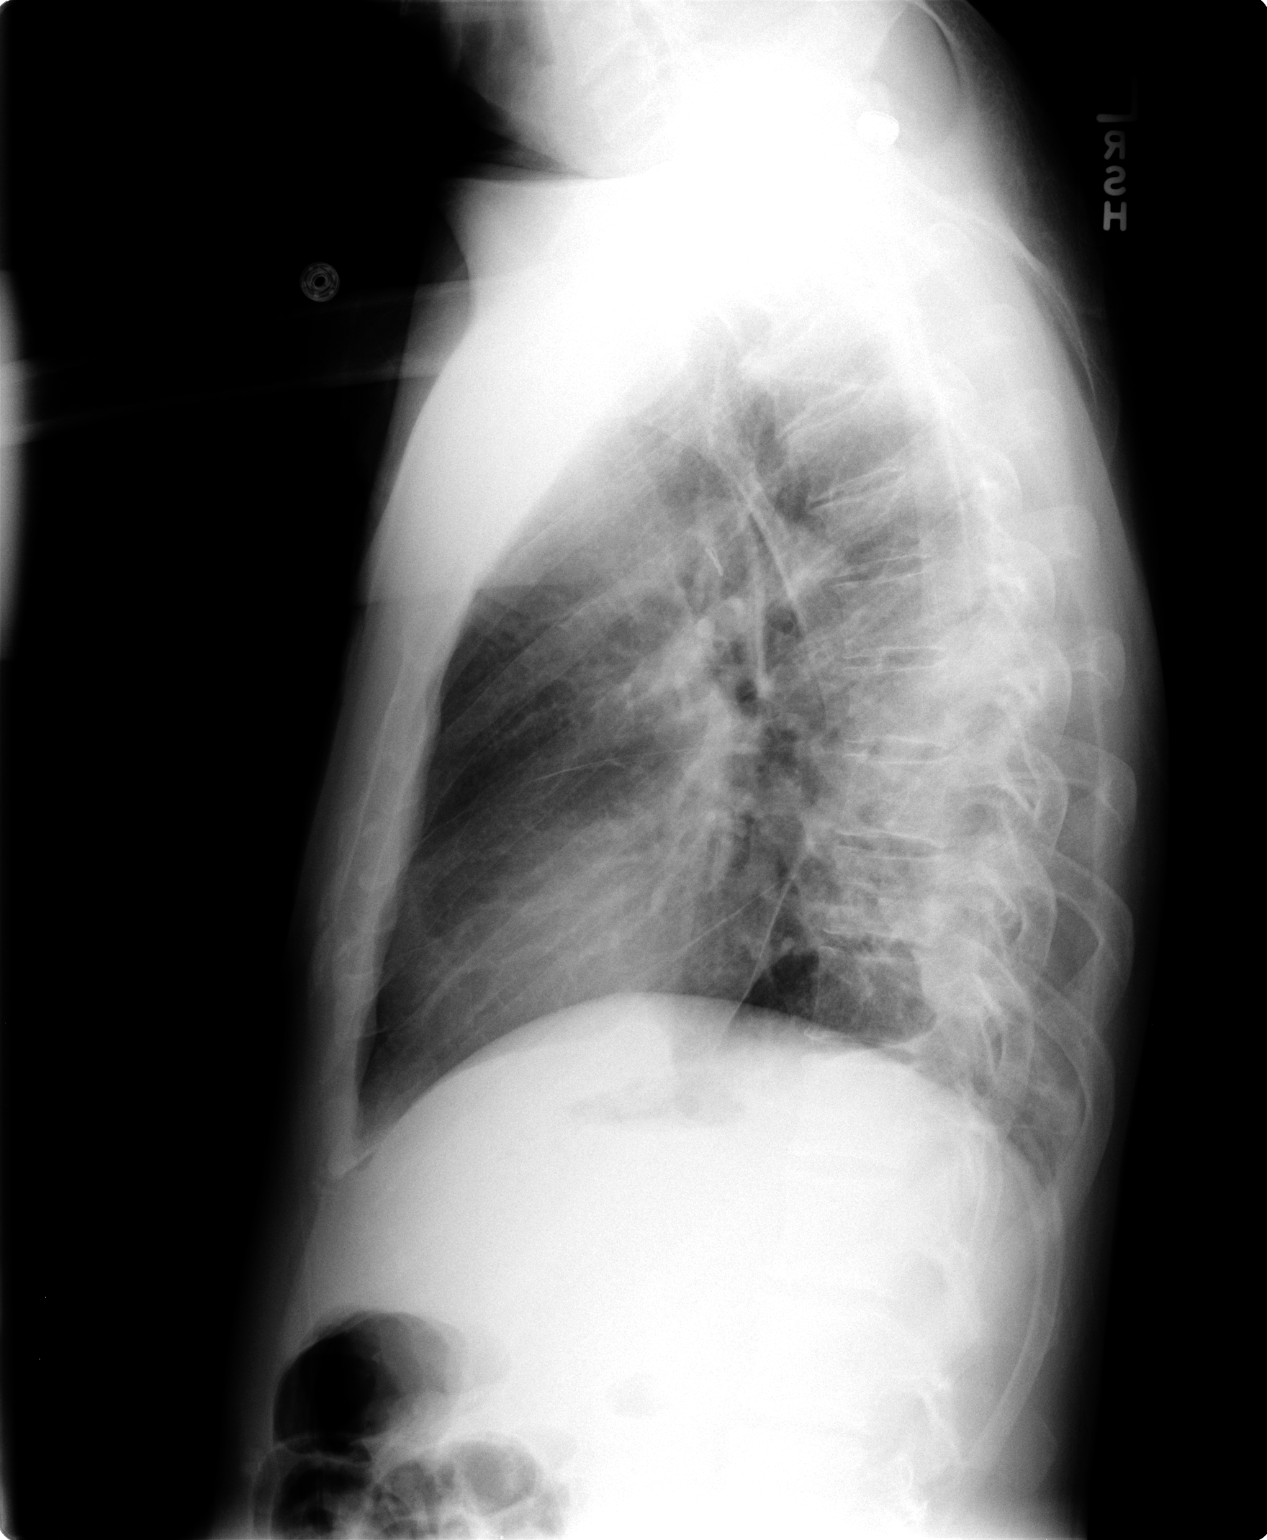

[2 of 2 positions shown; findings below may reference images not displayed]

FINDINGS: Heart size within normal limits.  Again noted is an
abnormal density in the left chest, projecting in the left
perihilar region on the PA view, and posteriorly on the lateral
view.  This may be a loculated pleural effusion, or abnormal
airspace density.  It appears to be smaller, on the lateral view.

There are no new findings.

Osseous structures intact.
IMPRESSION: Abnormal density in the left posterior lung or pleura - possibly
smaller but not resolved.  Continued follow-up needed.

## 2009-12-03 ENCOUNTER — Emergency Department (HOSPITAL_COMMUNITY): Admission: EM | Admit: 2009-12-03 | Discharge: 2009-12-03 | Payer: Self-pay | Admitting: Emergency Medicine

## 2009-12-13 ENCOUNTER — Emergency Department (HOSPITAL_COMMUNITY): Admission: EM | Admit: 2009-12-13 | Discharge: 2009-12-13 | Payer: Self-pay | Admitting: Emergency Medicine

## 2010-02-11 ENCOUNTER — Encounter: Payer: Self-pay | Admitting: Hematology & Oncology

## 2010-02-12 ENCOUNTER — Encounter: Payer: Self-pay | Admitting: General Surgery

## 2010-02-12 ENCOUNTER — Encounter: Payer: Self-pay | Admitting: Thoracic Surgery

## 2010-04-05 LAB — BASIC METABOLIC PANEL
BUN: 9 mg/dL (ref 6–23)
CO2: 29 mEq/L (ref 19–32)
Calcium: 9.8 mg/dL (ref 8.4–10.5)
Creatinine, Ser: 1.01 mg/dL (ref 0.4–1.5)
Glucose, Bld: 88 mg/dL (ref 70–99)

## 2010-04-05 LAB — CBC
Hemoglobin: 13.5 g/dL (ref 13.0–17.0)
Hemoglobin: 15.3 g/dL (ref 13.0–17.0)
MCH: 31.6 pg (ref 26.0–34.0)
MCHC: 35.7 g/dL (ref 30.0–36.0)
MCV: 88.4 fL (ref 78.0–100.0)
Platelets: 198 10*3/uL (ref 150–400)
RBC: 4.49 MIL/uL (ref 4.22–5.81)
RDW: 13.4 % (ref 11.5–15.5)
WBC: 4.9 10*3/uL (ref 4.0–10.5)

## 2010-04-05 LAB — DIFFERENTIAL
Basophils Absolute: 0 10*3/uL (ref 0.0–0.1)
Basophils Relative: 0 % (ref 0–1)
Basophils Relative: 0 % (ref 0–1)
Eosinophils Absolute: 0 10*3/uL (ref 0.0–0.7)
Eosinophils Absolute: 0.1 10*3/uL (ref 0.0–0.7)
Eosinophils Relative: 0 % (ref 0–5)
Eosinophils Relative: 2 % (ref 0–5)
Lymphs Abs: 1.9 10*3/uL (ref 0.7–4.0)
Monocytes Absolute: 0.5 10*3/uL (ref 0.1–1.0)
Monocytes Absolute: 0.6 10*3/uL (ref 0.1–1.0)
Monocytes Relative: 8 % (ref 3–12)
Neutrophils Relative %: 69 % (ref 43–77)

## 2010-04-05 LAB — COMPREHENSIVE METABOLIC PANEL
AST: 23 U/L (ref 0–37)
Albumin: 3.8 g/dL (ref 3.5–5.2)
Alkaline Phosphatase: 100 U/L (ref 39–117)
CO2: 24 mEq/L (ref 19–32)
Chloride: 109 mEq/L (ref 96–112)
GFR calc Af Amer: >60 mL/min (ref >60)
GFR calc non Af Amer: >60 mL/min (ref >60)
Potassium: 3.5 mEq/L (ref 3.5–5.1)
Total Bilirubin: 1.3 mg/dL — ABNORMAL HIGH (ref 0.3–1.2)

## 2010-04-27 LAB — URINALYSIS, ROUTINE W REFLEX MICROSCOPIC
Bilirubin Urine: NEGATIVE
Glucose, UA: NEGATIVE mg/dL
Nitrite: NEGATIVE
Specific Gravity, Urine: 1.024 (ref 1.005–1.030)
pH: 6.5 (ref 5.0–8.0)

## 2010-04-27 LAB — COMPREHENSIVE METABOLIC PANEL
ALT: 15 U/L (ref 0–53)
AST: 24 U/L (ref 0–37)
Albumin: 3.7 g/dL (ref 3.5–5.2)
CO2: 26 mEq/L (ref 19–32)
Calcium: 9 mg/dL (ref 8.4–10.5)
Creatinine, Ser: 1.16 mg/dL (ref 0.4–1.5)
GFR calc Af Amer: >60 mL/min (ref >60)
GFR calc non Af Amer: >60 mL/min (ref >60)
Sodium: 141 mEq/L (ref 135–145)
Total Protein: 6.3 g/dL (ref 6.0–8.3)

## 2010-04-27 LAB — DIFFERENTIAL
Eosinophils Absolute: 0.2 10*3/uL (ref 0.0–0.7)
Eosinophils Relative: 2 % (ref 0–5)
Lymphocytes Relative: 26 % (ref 12–46)
Lymphs Abs: 2.1 10*3/uL (ref 0.7–4.0)
Monocytes Relative: 7 % (ref 3–12)

## 2010-04-27 LAB — CBC
MCHC: 33.9 g/dL (ref 30.0–36.0)
MCV: 95.1 fL (ref 78.0–100.0)
Platelets: 205 10*3/uL (ref 150–400)
RBC: 4.18 MIL/uL — ABNORMAL LOW (ref 4.22–5.81)
RDW: 13.6 % (ref 11.5–15.5)

## 2010-04-27 LAB — LIPASE, BLOOD: Lipase: 67 U/L — ABNORMAL HIGH (ref 11–59)

## 2010-04-30 LAB — CARDIAC PANEL(CRET KIN+CKTOT+MB+TROPI)
Total CK: 114 U/L (ref 7–232)
Total CK: 191 U/L (ref 7–232)
Troponin I: 0.02 ng/mL (ref 0.00–0.06)
Troponin I: 0.02 ng/mL (ref 0.00–0.06)

## 2010-04-30 LAB — LIPID PANEL
HDL: 33 mg/dL — ABNORMAL LOW (ref >39)
Total CHOL/HDL Ratio: 4 RATIO

## 2010-04-30 LAB — POCT CARDIAC MARKERS
CKMB, poc: <1 ng/mL — ABNORMAL LOW (ref 1.0–8.0)
Myoglobin, poc: 53.8 ng/mL (ref 12–200)
Troponin i, poc: <0.05 ng/mL (ref 0.00–0.09)

## 2010-04-30 LAB — URINALYSIS, ROUTINE W REFLEX MICROSCOPIC
Hgb urine dipstick: NEGATIVE
Specific Gravity, Urine: 1.018 (ref 1.005–1.030)
Urobilinogen, UA: 1 mg/dL (ref 0.0–1.0)
pH: 7 (ref 5.0–8.0)

## 2010-04-30 LAB — CULTURE, BLOOD (ROUTINE X 2): Culture: NO GROWTH

## 2010-04-30 LAB — COMPREHENSIVE METABOLIC PANEL
ALT: 12 U/L (ref 0–53)
AST: 26 U/L (ref 0–37)
Albumin: 3.8 g/dL (ref 3.5–5.2)
Calcium: 9.2 mg/dL (ref 8.4–10.5)
GFR calc Af Amer: >60 mL/min (ref >60)
Potassium: 3.4 mEq/L — ABNORMAL LOW (ref 3.5–5.1)
Sodium: 141 mEq/L (ref 135–145)
Total Protein: 6.5 g/dL (ref 6.0–8.3)

## 2010-04-30 LAB — TROPONIN I: Troponin I: 0.03 ng/mL (ref 0.00–0.06)

## 2010-04-30 LAB — CBC
MCHC: 34.5 g/dL (ref 30.0–36.0)
RDW: 13.3 % (ref 11.5–15.5)

## 2010-04-30 LAB — DIFFERENTIAL
Eosinophils Absolute: 0.1 10*3/uL (ref 0.0–0.7)
Eosinophils Relative: 2 % (ref 0–5)
Lymphs Abs: 2.4 10*3/uL (ref 0.7–4.0)
Monocytes Absolute: 0.5 10*3/uL (ref 0.1–1.0)
Monocytes Relative: 9 % (ref 3–12)

## 2010-04-30 LAB — GLUCOSE, CAPILLARY: Glucose-Capillary: 82 mg/dL (ref 70–99)

## 2010-06-06 NOTE — Discharge Summary (Signed)
NAMEHAIM, HANSSON             ACCOUNT NO.:  [id number]   MEDICAL RECORD NO.:  [id number]          PATIENT TYPE:  INP   LOCATION:  3736                         FACILITY:  [facility]   PHYSICIAN:  Monte Fantasia, MD  DATE OF BIRTH:  12/06/1970   DATE OF ADMISSION:  01/04/2008  DATE OF DISCHARGE:                               DISCHARGE SUMMARY   ADDENDUM:  This is an addendum to the discharge summary dictated by Dr.  Berkley Harvey on January 12, 2008.   PRIMARY CARE PHYSICIAN:  Rose Phi. Myna Hidalgo, M.D., also the patient's  oncologist.   DISCHARGE DIAGNOSES:  1. 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  2. Possible micro abscesses in left cerebral white matter.  3. History of Hodgkin's disease.  4. History of thrombocytopenic purpura.   DISCHARGE MEDICATIONS:  1. Aspirin 325 mg p.o. daily.  2. Dulcolax 10 mg p.o. daily.  3. Colace 100 mg p.o. q.12.  4. Folic acid 1 mg p.o. daily.  5. Metoprolol 25 mg p.o. q.12.  6. Protonix 40 mg p.o. q.12.  7. Senna one tablet p.o. q.h.s.  8. Simvastatin/Zocor 20 mg p.o. q.h.s.  9. Vancomycin 750 mg IV q.8 hours day 4 today.  10.Tylenol 650 mg p.o. q.6 hours p.r.n. prior to vancomycin dosing.  11.Benadryl 50 mg p.o. q.8 hours p.r.n. prior to vancomycin dosing.   PROCEDURE:  PICC line.   RADIOLOGY:  Done during the hospital stay:  1. CT angio done on January 04, 2008.  Impression, negative for acute      PE or thoracic aortic dissection.  No definite change in bilateral      pulmonary nodules and mediastinal adenopathy.  Stable splenic      infarcts.  2. CT of the head done on January 04, 2008.  Impression, normal exam.  3. Stress test done on January 06, 2008.  Impression, good exercise      tolerance.  No definite sign of ischemia.  Ejection fraction though      normal there is a question of some hypokinesis at the septum.  4. Chest x-ray done on January 08, 2008.  Impression, left central    venous catheter and left chest tube are in place.  Tiny left apical      pneumothorax, moderate enlargement of cardiac silhouette, there is      bibasilar atelectasis and infiltrate is seen on the right.  There      is haziness of the medial left lung base.  5. Chest x-ray done on January 09, 2008, right base atelectasis      versus stent.  6. Chest x-ray done on January 10, 2008, no pneumothorax and right      basilar atelectasis.  7. Chest x-ray done on January 10, 2008, no definite pneumothorax,      left chest tube removal, bibasilar atelectasis.  8. Chest x-ray on January 11, 2008.  Impression, no left      pneumothorax, left hemithorax posterior and basilar subpleural and      pleural based opacity could reflect a loculated effusion versus  lower lobe consolidation.  9. Chest x-ray done on January 12, 2008, no change in appearance of      the chest since study prior, posterior density in the left      hemithorax most likely the presence of loculated fluid.  10.Chest x-ray done on January 13, 2008, abnormal density of the left      posterior lung or pleura probably smaller not resolved.  11.Chest x-ray done on January 15, 2008, persistent left pleural      effusion.  12.MRA/MRI of the brain done on January 14, 2008.  Impression,      punctate area of restricted diffusion of left cerebellar white      matter without abnormal enhancement, most likely a focal ischemia      lesion.  Micro abscess is not entirely excluded but considered less      likely.  Less intense diffusion signal abnormality in left      occipital and parietal lobe areas with areas of associated      enhancement involving the cortex and leptomeninges.  This may      represent subacute ischemic insults, focal cerebritis or even      sarcoid is considered, this atypical location for sarcoid .There is      no definite abscess. This is unusual appearance for lymphoma as      well.  MRI/MRA of the head  done.  Impression, abnormal MRA and MRI      of the head.   LABS ON DISCHARGE:  CBC with differential done on January 13, 2008,  total WBC 9.2, hemoglobin 11.2, hematocrit 32.8, platelets 112.  BMP  done on January 15, 2008, sodium 138, potassium 3.5, chloride 103,  bicarb 27, glucose 105, BUN 11, creatinine 1.02.  Cardiac enzymes x3  sets have been negative.  Blood cultures suggest coagulase negative  species sensitive to gentamicin, levofloxacin, rifampin, vancomycin;  these have been drawn from January 11, 2008.  AFB cultures done on  December 17 have been no acid fast bacilli seen.  Culture of fungus and  smear on December 17 no yeast or fungal elements.  Tissue cultures done  on January 08, 2008, no growth for 2 days.   HOSPITAL COURSE:  For the course of hospital stay please refer to Dr.  Malena Catholic description of the course for hospital stay dictated on  January 12, 2008.  From the course of hospital stay dated from December  23 ID evaluation was asked for as TEE was negative for any vegetations  in view of the blood cultures being Staph coagulase negative.  As per  discussions with the ID team recommended to continue with vancomycin to  complete a course of 6 weeks.  The patient is ordered to get a PICC line  for the same and will arrange nursing home services along with home care  for vancomycin dosing for the same.  Also the patient underwent an MRI  of the head in last 24 hours which showed findings as dictated above.  As per the ID the patient has high suspicion in view of his history with  splenic infarcts with the bacteremia and with the abnormal MRI of the  head more suggestive of infective endocarditis although TEE has been  negative.  Gentamicin has been discontinued which the patient was  initially started on and to continue as prior recommendations.  To  continue vancomycin for completion of 6 weeks.  The patient is day 4 of  vancomycin today.  DISPOSITION:  The  patient is also recommended to follow up with his  primary care physician as an outpatient next week and also will arrange  for visiting nurse services for vancomycin to complete a course of 6  weeks.      Monte Fantasia, MD  Electronically Signed     MP/MEDQ  D:  01/15/2008  T:  01/15/2008  Job:  [id number]

## 2010-06-06 NOTE — H&P (Signed)
[name], [name]             ACCOUNT NO.:  [id number]   MEDICAL RECORD NO.:  [id number]          PATIENT TYPE:  INP   LOCATION:  6714                         FACILITY:  [facility]   PHYSICIAN:  Della Goo, M.D. DATE OF BIRTH:  1970/05/10   DATE OF ADMISSION:  12/14/2007  DATE OF DISCHARGE:                              HISTORY & PHYSICAL   PRIMARY CARE PHYSICIAN:  None.   ONCOLOGIST:  Rose Phi. Myna Hidalgo, M.D.   CHIEF COMPLAINT:  Left-sided abdominal pain.   HISTORY OF PRESENT ILLNESS:  This is a 40 year old male with a history  of Hodgkin's disease diagnosed in 2001 who is currently in remission who  presents to the emergency department with complaints of severe left  upper quadrant abdominal pain which started in the afternoon.  The  patient reports the pain was of sudden onset.  He rates the pain as  being a 10/10 and was associated with nausea and vomiting.  He denies  having any shortness of breath, lightheadedness or syncope associated  with the symptoms.  He denies having any chest pain associated with  symptoms.  He also denies having any fevers, chills and myalgia,  congestion, or diarrhea associated with discomfort.   The patient was evaluated in the emergency department and a CT scan of  the abdomen and pelvis was performed which reveals an acute splenic  infarct.  The patient was started on a low dose heparin drip at the  advice of the oncologist on call, Dr. Donnie Coffin, and was referred for  admission.   PAST MEDICAL HISTORY:  As mentioned above.  1. Hodgkin's disease diagnosed in 2001 and currently in remission.  2. Also history of TTP.  3. Recent diagnosis of sarcoidosis in October 2009.   PAST SURGICAL HISTORY:  1. History of the biopsy of the left inguinal area secondary to      Hodgkin's disease.  2. Open reduction internal fixation of left elbow fracture in 2002.  3. Two previous biopsies of the supraclavicular area secondary to      sarcoidosis.  4.  Tonsillectomy and adenoidectomy.   MEDICATIONS:  Folic acid and Hydrocodone/APAP.   ALLERGIES:  NO KNOWN DRUG ALLERGIES.   SOCIAL HISTORY:  The patient is married with children.  He is a  nonsmoker, nondrinker.   FAMILY HISTORY:  Noncontributory.   REVIEW OF SYSTEMS:  Pertinents are mentioned above.   PHYSICAL EXAMINATION:  GENERAL:  This is a 40 year old, well-nourished,  well-developed male in discomfort but no acute distress.  VITAL SIGNS:  Temperature 98.1, blood pressure 132/84, heart rate 82, respirations 20,  oxygen saturation 97-100%.  HEENT:  Normocephalic, atraumatic.  There is no scleral icterus.  Pupils  are equally round and reactive to light.  Extraocular movements are  intact.  Funduscopic benign.  Oropharynx clear.  NECK:  Supple.  Full range of motion.  No thyromegaly, adenopathy,  jugulovenous distention.  CARDIOVASCULAR:  Regular rate and rhythm.  No  murmurs, gallops or rubs.  LUNGS:  Clear to auscultation bilaterally.  ABDOMEN:  Positive bowel sounds.  Soft, tender in the left upper  quadrant area.  There is no rebound or guarding.  There is no palpable  splenomegaly or megaly.  EXTREMITIES:  Without cyanosis, clubbing or edema.  NEUROLOGIC:  Neurologic nonfocal.   LABORATORY STUDIES:  White blood cell count 8.5, hemoglobin 14.7,  hematocrit 44.1, platelets 159,000, neutrophils 78%, lymphocytes 15%,  MCV 88.7.  Sodium 140, potassium 3.7, chloride 102, bicarb 32, BUN 15,  creatinine 1.3, and glucose 108.  Protime 14.3, INR 1.1 and PTT 25.  Urinalysis negative and CT scan of the abdomen as mentioned above  reveals an acute wedge-shaped area of low density in the superior aspect  of the spleen, also fatty liver changes are present.   ASSESSMENT:  A 40 year old male being admitted with:  1. Acute splenic infarct.  2. Abdominal pain secondary to #1.  3. Hodgkin's disease.  4. Sarcoidosis.  5. Recent history of thrombotic thrombocytopenic purpura.   PLAN:   The patient will be admitted and has been started on a low dose  IV heparin drip which will be managed by pharmacy and monitored.  Pain  control therapy.  Antiemetics have also been ordered as needed and the  patient will be placed on IV fluids for maintenance therapy.  GI  prophylaxis has also been ordered.  The oncology service will also be  notified.      Della Goo, M.D.  Electronically Signed     HJ/MEDQ  D:  12/14/2007  T:  12/15/2007  Job:  [id number]

## 2010-06-06 NOTE — Discharge Summary (Signed)
[name], [name]             ACCOUNT NO.:  [id number]   MEDICAL RECORD NO.:  [id number]          PATIENT TYPE:  INP   LOCATION:  3736                         FACILITY:  [facility]   PHYSICIAN:  Charlestine Massed, MDDATE OF BIRTH:  Jun 11, 1970   DATE OF ADMISSION:  01/04/2008  DATE OF DISCHARGE:                               DISCHARGE SUMMARY   DATE OF DISCHARGE:  Not known.   PRIMARY CARE PHYSICIAN:  Rose Phi. Myna Hidalgo, M.D. who is also this  patient's oncologist.   REASON FOR ADMISSION:  Abdominal pain, nausea, chest pain, and left arm  numbness.   </   HISTORY OF PRESENT ILLNESS AND HOSPITAL COURSE:  [name] is a 40-  year-old gentleman who has a history of Hodgkin's disease diagnosed in  2001 and he is in remission following with Dr. Myna Hidalgo.  He was recently  admitted to the hospital with a diagnosis of splenic  infarct and .  He  noted severe abdominal pain, 10 out of 10, with nausea, vomiting.  He  also has a questionable history of TTP and questionable history of  sarcoidosis. He was admitted to the hospital to rule out any pulmonary  embolism and CT scan of the chest was done and the CT scan showed  multiple nodules visible, so myocardial diagnosis was ruled out by  serial enzymes.  The patient was VATS procedure for biopsy of the  nodular lesion which revealed that they are sarcoidosis.  All the  lesions revealed  that they are sarcoidosis .  Pulmonary consult was  done for sarcoidosis and they want to follow the patient as an  outpatient.  No steroid therapy for now.  The patient had chest tube  placed after the VATS and was removed after one day.  The patient  feeling fine but yesterday the patient was running a temperature with  chills.  Of note, sputum production, blood cultures were done.  Urine  cultures were done.  Urinalysis was negative.  Chest tube was able to be  shortened  and mild change between the x-ray done on admission and the  current x-ray done  last night.  Hilar prominence was possibly lingular  pneumonia even though the report states there is no evidence of any  infiltrates, but there is some increased nodes on the left side.  The  patient was then started on Avelox for the possible pneumonia.  Also  blood cultures drawn at that time and both sets came positive for gram-  positive cocci in clusters, full report pending, and the patient was  started on vancomycin pending full results.  If MRSA negative, then we  will discontinue vancomycin and start the patient on nafcillin.   DISCHARGE DIAGNOSES:  1. Pulmonary sarcoidosis.  2. History of Hodgkin's disease.  3. Questionable history of thrombotic thrombocytopenic purpura.   FOLLOW UP:  The patient will be following with Dr. Arlan Organ for  further followup.   DISCHARGE MEDICATIONS:  1. Aspirin 325 mg daily,  2. Colace 100 mg daily.  3. Folic acid 1 mg daily.  4. Novologsliding scale.  5. Lopressor 25 mg  p.o. daily.  6. Avelox 100 mg IV q.24h.  7. Protonix 40 mg daily.  8. Zocor 20 mg at bedtime.  9. Ambien 5 mg  one tablet p.o. at bedtime.  10.Vancomycin as per vancomycin protocol.  11.Albuterol nebulizer q.6h. p.r.n.  12.Zofran p.r.n.  13.Pain control with Percocet q.6h. p.r.n.   All discharge meds will be dictated at the time of exact discharge.      Charlestine Massed, MD  Electronically Signed     UT/MEDQ  D:  01/12/2008  T:  01/12/2008  Job:  [id number]

## 2010-06-06 NOTE — H&P (Signed)
[name], [name]             ACCOUNT NO.:  [id number]   MEDICAL RECORD NO.:  [id number]          PATIENT TYPE:  INP   LOCATION:  6735                         FACILITY:  [facility]   PHYSICIAN:  Rose Phi. Myna Hidalgo, M.D. DATE OF BIRTH:  1970/05/11   DATE OF ADMISSION:  10/24/2007  DATE OF DISCHARGE:                              HISTORY & PHYSICAL   REASON FOR ADMISSION:  1. Thrombotic thrombocytopenic purpura.  2. Sarcoidosis.  3. History of Hodgkin disease.   HISTORY OF PRESENT ILLNESS:  Mr. Vandam is a very nice 40 year old  black gentleman who recently was discharged from the [facility].  He  had been admitted because of systemic symptoms of fever, weight loss,  and lymphadenopathy.  He was felt to have recurrent Hodgkin disease.  He  underwent scalene node biopsy.  The scalene node biopsy thankfully did  not show any evidence of Hodgkin disease, but it did show sarcoidosis.   He was subsequently discharged in good shape.  We saw him last week in  the office.  His platelet count was down at 17,000.  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  He was started on prednisone.   Today, his platelet count was up to 73,000.  However, on blood smear, he  had numerous schistocytes.   He had lab work done in the office which showed a LDH that was quite  elevated at 760.  His haptoglobin was nonexistent.  He had elevated  total bilirubin that was indirect.  He had a hemoglobin of 9.7, which  was decreasing.   Subsequently, now he is being admitted for urgent plasmapheresis.   He has not had any recent fevers.  He has had no neurological findings.  In fact, he wanted to go back to work, he was feeling so well.   PAST MEDICAL HISTORY:  See old records.   ALLERGIES:  None.   MEDICATIONS:  1. Prednisone 16 mg p.o. daily.  2. Mycelex troches 1 p.o. t.i.d.  3. Percocet as needed.   SOCIAL HISTORY:  Negative for tobacco or alcohol use.   FAMILY HISTORY:  Unremarkable.   PHYSICAL EXAMINATION:  GENERAL:  This is a well-developed, well-  nourished black gentleman in no obvious distress.  He is alert and  oriented x3.  VITAL SIGNS:  Temperature of 97, pulse is 78, respiratory rate 16, and  blood pressure is 150/82.  HEAD/NECK:  Normocephalic, atraumatic skull.  There are no ocular or  oral lesions.  There is no scleral icterus.  LUNGS:  Clear bilaterally.  CARDIAC:  Regular rate and rhythm with a normal S1 and S2.  There are no  murmurs, rubs, or bruits.  ABDOMEN:  Soft with good bowel sounds.  There is no palpable abdominal  mass.  No palpable hepatosplenomegaly.  BACK:  No tenderness of the spine or ribs.  EXTREMITIES:  No clubbing, cyanosis, or edema.  NEUROLOGIC:  No focal neurological deficits.   LABORATORY DATA:  Labs were all pending.   IMPRESSION:  Mr. Unangst is a 40 year old gentleman with what clinically  appears to be a thrombotic  thrombocytopenic purpura.  This certainly  could be associated with sarcoidosis.   He will be admitted tonight.  We will keep him on his prednisone, as I  think the prednisone has helped to some degree.   I already spoke with the Interventional Radiology.  They will place a  dialysis catheter in the morning.   I spoke with Dialysis Unit.  He will start plasmapheresis in the  morning.   I have spoken with the Blood Bank and they will have plasma on hold for  him.   We will check daily labs on him.  Hopefully, we will see that he is self-  responding to the pheresis in a matter of 2-3 days.  Once I note that he  is responding to pheresis, then we can certainly move him to the  outpatient setting.      Rose Phi. Myna Hidalgo, M.D.  Electronically Signed     PRE/MEDQ  D:  10/24/2007  T:  10/25/2007  Job:  [id number]

## 2010-06-06 NOTE — H&P (Signed)
NAMEGARREN, GREENMAN             ACCOUNT NO.:  [id number]   MEDICAL RECORD NO.:  [id number]          PATIENT TYPE:  EMS   LOCATION:  MAJO                         FACILITY:  [facility]   PHYSICIAN:  Theodosia Paling, MD    DATE OF BIRTH:  1970/03/19   DATE OF ADMISSION:  01/04/2008  DATE OF DISCHARGE:                              HISTORY & PHYSICAL   PRIMARY CARE PHYSICIAN:  Rose Phi. Myna Hidalgo, M.D.  who is the oncologist  of the patient.   CHIEF COMPLAINT:  Severe abdominal pain, nausea, chest pain, left arm  numbness.   HISTORY OF PRESENT ILLNESS:  Mr. Cham is a 40 year old gentleman who  has a history of Hodgkin's lymphoma in 2001 who is in remission and is  followed by Dr. Myna Hidalgo. He was recently admitted to the hospital with  diagnosis of splenic infarct.  A transesophageal echocardiogram did not  show any intracardiac clots.  He is now presenting with sudden onset of  10/10 severe abdominal pain, generalized, associated with nausea, which  later on according to him stayed with him for around an hour. After  taking Percocet it slightly eased the pain, but it did not go away. He  states that it started at midnight. He does not know if he slept after  that or he passed out.  He states that when he woke up he had left-sided  chest pain which was radiating down to the left arm with associated left  arm numbness.  He decided to present to the emergency room. In the ER he  received 1 mg of Dilaudid which eased pain to 4/10 in severity.  The  patient was also very anxious, as he said these were the same symptoms  he had when he was found to have splenic infarct.  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   [facility] was consulted for further evaluation and management of  the patient and  possibly admission.   PAST MEDICAL HISTORY:  1. Hodgkin's disease diagnosed in 2001, currently in remission.  2. Questionable history of TTP.  3. Questionable diagnosis of sarcoidosis.   PAST SURGICAL HISTORY:  1. History of biopsy of the left inguinal area secondary to Hodgkin's      disease.  2. Open reduction and internal fixation of left elbow fracture in      2002.  3. Two previous biopsies of the supraclavicular area secondary to      sarcoidosis.  4. Tonsillectomy and adenoidectomy.   HOME MEDICATIONS:  Include the following:  1. Folic acid 1 mg p.o. daily.  2. Percocet 5/325 mg p.o. q.6h. p.r.n.  3. Colace 100 mg p.o. q.12h.  4. Phenergan 12.5 mg p.o. q.6h.   ALLERGIES:  No known drug allergies.   SOCIAL HISTORY:  The patient is married with children.  Denies tobacco,  alcohol, or IV drug abuse.   FAMILY HISTORY:  Noncontributory.  REVIEW OF SYSTEMS:  CONSTITUTIONAL: Denies any sudden weight loss,  weight gain.  Denies fevers or chills. HEENT: Denies blurred vision, ear  or nose discharge.  LUNGS: Denies productive cough or pleuritic pain.  CARDIAC:  Chest pain as mentioned in the HPI.  Denies PND, orthopnea, or  palpitations.  GI:  Has nausea and abdominal pain as mentioned in the  HPI.  Denies vomiting, hematemesis, hematochezia. SKIN: Denies any new  skin lesions. EXTREMITIES: Denies pedal edema.  CNS:  Denies any focal  neurological deficits except for transient left arm numbness.  PSYCHIATRIC:  Denies any suicidal ideations.   PHYSICAL EXAMINATION:  VITAL SIGNS:  Heart rate 78, blood pressure  158/98, temperature 98, respirations 22.  Oxygen saturation 97% on room  air.  GENERAL:  No acute cardiovascular distress.  However, still looks  uncomfortable, complaining of 4/10 pain.  HEENT: Pupils are equal, round, and reactive to light.  EOMs intact.  NECK:  Supple.  CARDIOVASCULAR: S1 and S2 normal.  No murmur or gallop.  LUNGS: Normal breath sounds. No   rhonchi or crepitations.  ABDOMEN:  Soft and nontender, no organomegaly. Positive bowel sounds.  EXTREMITIES: No clubbing, cyanosis, or edema.  NEUROLOGICAL:  Cranial nerves are intact.  Motor and sensory intact.  PSYCHIATRIC:  Oriented x3.   LABORATORY DATA:  White blood cell count 7.5, hemoglobin 13.4,  hematocrit 39.6, platelet count 483.  Sodium 140, potassium 3.5,  chloride 103, bicarbonate 30, glucose 148, BUN 15, creatinine 1.14.  Bilirubin total 0.7, alkaline phosphatase 134, AST 28, ALT 20, total  protein 7.4.  Calcium 9.8. CK-MB less than 1, troponin less than 0.05,  myoglobin 54.7.   ASSESSMENT AND PLAN:  1. Chest pain, very atypical, unlikely to be cardiac in origin.      However, he had a history of lymphoma, recent splenic stroke, could      be hypercoagulable. Will admit the patient as an observation to      rule out myocardial infarction.  Cardiac enzymes will be cycled.      Started the patient on Coreg and lisinopril on last admission.      Also the patient was found to be hypertensive.  Will check the      fasting cholesterol. Continue aspirin.  Admit the patient to      telemetry.  I do not think we will have to repeat echo because his      echo did not have any wall motion defect. However, will leave this      to the rounding physician to decide.  2. Abdominal pain, questionable appears to be related to the      gastroesophageal reflux disease. CT scan is negative.  Will      discontinue p.r.n. pain medication.  Could be related to his      splenic infarct.  3. Left hand numbness.  Could be related to pain or position of sleep.      Given he had a splenic infarct where no clot was found, he could      very well be having transient ischemic attack. Continue aspirin,      neurological checks, and a CT scan to rule out embolic stroke.  An      MRI can be performed on Monday by rounding physician feel it      is necessary or if neurological deficits deteriorate.  For  now will      continue the patient on aspirin.  4. Prophylaxis. Deep  vein thrombosis and gastrointestinal prophylaxis      on board.  5. Code status.  The patient is full code.      Theodosia Paling, MD  Electronically Signed     NP/MEDQ  D:  01/04/2008  T:  01/04/2008  Job:  [id number]   cc:   Rose Phi. Myna Hidalgo, M.D.

## 2010-06-06 NOTE — Op Note (Signed)
NAMESTIVEN, [name]             ACCOUNT NO.:  [id number]   MEDICAL RECORD NO.:  [id number]          PATIENT TYPE:  AMB   LOCATION:  SDS                          FACILITY:  [facility]   PHYSICIAN:  Leslye Peer, MD    DATE OF BIRTH:  [birth year]   DATE OF PROCEDURE:  11/14/2007  DATE OF DISCHARGE:  11/14/2007                               OPERATIVE REPORT   PROCEDURE:  Fiberoptic bronchoscopy with endobronchial ultrasound and  transbronchial needle biopsies.   OPERATORS:  1. Ines Bloomer, MD  2. Leslye Peer, MD  3. Oretha Milch, MD   INDICATION:  History of lymphoma with lymphadenopathy that is PET  positive.   MEDICATIONS USED:  The procedure was done under general anesthesia.   CONSENT:  Informed consent was obtained from the patient and a signed  copy is on his hospital chart.   PROCEDURE DETAILS:  After informed consent was obtained as outlined  above, the patient was placed under general anesthesia without  difficulty.  A general inspection bronchoscopy was performed via the  endotracheal tube.  This showed no evidence of distal tracheal  abnormality.  The main carina was sharp.  The right mainstem bronchus,  right upper lobe, bronchus intermedius, right middle lobe and right  lower lobe bronchi were all within normal limits without any evidence of  endobronchial lesion or abnormal secretions.  Attention was turned to  the left side of the exam.  The left mainstem bronchus, left upper lobe  bronchus, lingular airways, and left lower lobe bronchi were all within  normal limits, again without any abnormal secretions or endobronchial  lesions.  The fiberoptic bronchoscope was withdrawn and the EBUS scope  was introduced via the endotracheal tube. Under real time ultrasound  guidance, transbronchial needle biopsies were performed at the  subcarinal lymph node, the level 4L lymph node, and the level 4R lymph  node.  These samples were sent for cytology.  The patient  tolerated the  procedure well.  There was no evidence of bleeding from the puncture  sites when the scope was withdrawn and the airways were visualized.  There were no obvious complications.  The patient was recovered by  Anesthesia in preparation for mediastinoscopy by Dr. Edwyna Shell.   SAMPLES:  1. Transbronchial needle biopsy from the subcarinal node.  2. Transbronchial needle biopsy from the level R4 node.  3. Transbronchial needle biopsy from the level L4 node.   PLANS:  The cytology from the patient's nodal biopsies will be evaluated  and correlated with his mediastinoscopy in order to plan further  evaluation and therapy.      Leslye Peer, MD  Electronically Signed     RSB/MEDQ  D:  11/14/2007  T:  11/14/2007  Job:  [id number]   cc:   Ines Bloomer, M.D.  Oretha Milch, MD

## 2010-06-06 NOTE — Letter (Signed)
March 02, 2008   Barbaraann Share, MD,FCCP  [address]  [place], [place] [address]   Re:  NATURE, VOGELSANG             DOB:  1970/12/02   Dear Mellody Dance:   I appreciate to see the patient.  He came today for followup of his lung  biopsies.  His chest x-ray showed marked resolution of his pulmonary  nodules.  We removed his PICC line.  Overall, he is doing well.  His  incisions are well healed.  He has done well from our standpoint, since  I will let you and Dr. Myna Hidalgo follow him from now on.  I will be happy  to see him again if he has any future pulmonary problem.   Sincerely,   Ines Bloomer, M.D.  Electronically Signed   DPB/MEDQ  D:  03/02/2008  T:  03/02/2008  Job:  [id number]   cc:   Rose Phi. Myna Hidalgo, M.D.

## 2010-06-06 NOTE — Consult Note (Signed)
NAMEFARRIS, [name]             ACCOUNT NO.:  [id number]   MEDICAL RECORD NO.:  [id number]          PATIENT TYPE:  INP   LOCATION:  6704                         FACILITY:  [facility]   PHYSICIAN:  Samul Dada, M.D.DATE OF BIRTH:  [birth year]   DATE OF CONSULTATION:  DATE OF DISCHARGE:                                 CONSULTATION   HISTORY:  Mr. [name] is a 40 year old African American male  whom I am asked to see in consultation by one of the [facility] doctors  for evaluation of an abnormal chest CT scan showing mediastinal and left  hilar adenopathy suggestive of recurrent lymphoma.  Mr. [name]  presented to the emergency room because of right-sided sharp chest pain  with numbness down his right arm and some shortness of breath.  The  patient said the pain was sharp, steady, fairly severe, unrelenting  until he got pain medicine.  Two months earlier, he had had a similar  episode that lasted only 10 minutes, was far less severe.  The patient  was admitted to the hospital through the emergency room because he was  felt to be in renal failure.  Ultimately, the lab values were shown to  be erroneous.  The patient had CT scan of the chest with IV contrast  carried out today on the 12th.  There was bulky mediastinal adenopathy  with an index prevascular lymph node measuring 2 cm in short axis and a  subcarinal lymph node measuring 2.3 cm.  Left hilar lymphoid tissue  measured 1 cm.  There was a left axillary lymph node that measured 1 cm.  There were multiple foci of air space consolidation with a largest seen  in the subpleural location of the left lower lobe.  I reviewed the CT  scan with radiologist specifically to address whether there is any  evidence of pulmonary emboli.  He assures me that there is not on this  non angiogram scan.   The patient has had other symptoms of concern over the past year,  specifically morning nausea and vomiting with 25-pound weight  loss,  fever, chills, and fairly prominent night sweats.   PAST MEDICAL HISTORY:  Fairly benign.  The patient had left inguinal  hernia repair at age 58, tonsillectomy in June 2001, repair with rod and  screws, fracture of his left arm in 2005, also some trauma to the right  index finger where he shaved the tip off.  No other medical problems  except for most importantly a diagnosis of Hodgkin's disease involving  the left groin.  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  The patient was said to have stage IIA disease,  was treated with 4 cycles of ABVD chemotherapy under the direction of  Dr. Myna Hidalgo and then had radiation.  He appeared to be in complete  remission at that time and has not had any recent followup with Dr.  Myna Hidalgo.  He identifies his primary physician as Dr. Susann Givens.   ALLERGIES:  No allergies to any medicines.   MEDICATIONS:  The patient was not on any medicines  at the time of  admission to the hospital.   FAMILY HISTORY:  Positive for diabetes and hypertension.  Maternal  grandmother died of pancreatic cancer at age 14.  The patient has 3  brothers.  His younger brother has sickle cell trait.  He has 3 sons and  a daughter who are in good health.  No history of lymphoma in the  family.   SOCIAL HISTORY:  The patient denies any history of tobacco use or  alcohol.  He has used marijuana.  Test for HIV a couple of years ago was  negative.  The patient denies any possible exposure to HIV.  He has  lived in [place] all his life.  He graduated at [employer]  and got his GED.  Has been married for 19 years.  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  The patient drives as  active.  He worked on Thursday, but had symptoms on Friday, which was  yesterday October 03, 2007, requiring him to be admitted.   REVIEW OF SYSTEMS:  The patient has felt generally unwell for the past  year.  He denies  any actual neurologic problems.  He has had some  dizziness for the past couple of months.  Denies any other neurologic  problems.  He has occasionally had blurry vision.  No hearing problems.  No hayfever or sinus problems.  He has had poor appetite, he has lost  about 25 pounds.  His baseline weight has tended to run around 140  pounds with a height of 5 feet 5 inches.  A year ago, he weighed 165  pounds.  He denies any reflux symptoms, has had morning nausea and  vomiting, and feels fine.  He has tendency for constipation.  No blood  in the stools.  No liver problems or jaundice.  He denies any cardiac  problems.  As stated, the pain has been sharp, stabbing, and steady.  The patient does have a chronic dry cough, but denies any recent cough  or any previous shortness of breath until just the last couple of days.  No urinary symptoms or problems.  No swelling of the legs, history of  blood clots, or intermittent claudication.  No bleeding or bruising  problems.  No arthritis, back, or neck pain.  No other musculoskeletal  pains.  The patient has felt feverish with chills, but has not take his  temperature.  He is has night sweats for the past year, sometimes  requiring of the changes close twice a night.  He denies any rashes or  itching.  He has felt down of the dumps.  He has not sought medical  attention because he has been scared aborted by child.   PHYSICAL EXAMINATION:  GENERAL:  The patient is a generally well-  developed and well-nourished man who seems a little short of breath.  VITAL SIGNS:  O2 saturation on room air was 98%.  He is an afebrile  hospital.  Most recent blood pressure was 143/87.  Pulse, respirations  normal.  O2 saturation on room air has been running about 99%.  No scalp  or skull lesions.  HEENT:  No scleral icterus.  Pupillary and extraocular movements are  normal.  Mouth and pharynx are benign.  Teeth are in good repair.  HEART AND LUNGS:  Normal.  The  patient has a scar from a previous Port-a-  Cath in the right chest.  No axillary  or inguinal adenopathy.  BACK:  No skeletal tenderness or deformity.  ABDOMEN:  Soft, scaphoid, and nontender with no organomegaly or masses  palpable.  EXTREMITIES:  No peripheral edema, clubbing, palmar erythema, petechiae,  or purpura.  NEUROLOGIC:  Grossly normal.   LABORATORY DATA:  Today, white count was 5.6, hemoglobin 14.0,  hematocrit 41.5, and platelets 140,000.  Differential was normal on  admission.  Chemistries today were basically normal.  BUN 9 and  creatinine 1.22.  Albumin 3.8, calcium 9.3, and LDH was 219.  CT scan of  the chest as described above.   IMPRESSION AND PLAN:  Clearly the major concern here is the patient may  have recurrent Hodgkin lymphoma involving his mediastinum and left hilar  lymph nodes.  He has some patchy infiltrates and the possibility of even  lymphoma involving the lung or perhaps infection cannot be excluded.  Other possibilities were the patient's abnormal CT scan, could include  non-Hodgkin lymphoma, cancer, or perhaps even somewhat unusual such as  sarcoidosis.  I do not think the patient can be sufficiently or safely  worked up as an outpatient given these findings and his current  symptoms.  He does seem a little short of breath, even though his O2 sat  seems to be normal.  I think we should go ahead with a PET scan done.  I  am going to order a sedimentation rate and an ACE level.  If the  patient's abnormal findings are limited to the chest, then the patient  may need to have mediastinoscopy where that is for tissue confirmation.   As stated above, Dr. Myna Hidalgo took care of this patient for several years  back in 2001, and subsequently until the patient was lost to his follow  up.  I will notify Dr. Myna Hidalgo, so that he can continue with the  patient's workup and possible future treatments.      Samul Dada, M.D.  Electronically Signed      DSM/MEDQ  D:  10/04/2007  T:  10/05/2007  Job:  [id number]   cc:   Samul Dada, M.D.  Rose Phi. Myna Hidalgo, M.D.  [facility] Salvia Eliott Nine, M.D.  Sharlot Gowda, M.D.

## 2010-06-06 NOTE — Consult Note (Signed)
NAMEJAKYLAN, RON             ACCOUNT NO.:  [id number]   MEDICAL RECORD NO.:  [id number]          PATIENT TYPE:  INP   LOCATION:  3729                         FACILITY:  [facility]   PHYSICIAN:  Dr. Ashley Royalty           DATE OF BIRTH:  1970/06/26   DATE OF CONSULTATION:  01/05/2008  DATE OF DISCHARGE:                                 CONSULTATION   REFERRING PHYSICIAN:  Dr. Ashley Royalty.   INDICATION FOR CONSULTATION:  Evaluation of chest pain.  The patient  undergone a VATS procedure.   HISTORY OF PRESENT ILLNESS:  The patient is a very pleasant 40 year old  man who has a history of multiple medical problems including splenic  infarct and Hodgkin disease diagnosed in 2001.  There is a questionable  history of TTP, which is not well characterized.  There is a  questionable history of sarcoidosis also not will characterize.  The  patient has recently been evaluated by Dr. Edwyna Shell and is scheduled for a  VATS procedure as he has lung nodules and the etiology of them is  unclear.  The patient was admitted to the hospital several days prior to  his scheduled VATS procedure with recurrent chest and abdominal pain.  The etiology of his symptoms is unclear.  Cardiac enzymes have been  negative for ischemia, and the EKGs demonstrate no acute ST-T changes,  though he does have LVH-type pattern on his 12-lead EKG.  There are  nonspecific T-wave abnormalities as well.  His chest pain has resolved.  He is referred now for additional evaluation.  The patient is quite  anxious.  He notes that his symptoms are not particularly exertional,  but that came on suddenly without particular warning associated with  nausea and some diaphoresis.   Family history is notable for father with coronary artery disease.  His  mother does not have coronary artery disease.  There is a family history  of pancreatic cancer as well.   SOCIAL HISTORY:  The patient is married.  He denies tobacco or ethanol  abuse.  Denies  recreational drug use.   His review of systems is as noted in the HPI.  He has a surgical history  that is notable for a fractured of his left elbow for which he underwent  open reduction and internal fixation.  Otherwise, review of systems is  negative except as noted in the HPI, but also notable that the patient  is extremely anxious and has difficulty sleeping at night.   On physical exam, he is a pleasant well-appearing young man in no  distress.  Blood pressure was 122/82, the pulse was 67 and regular,  respirations were 20, temperature is 98, weight was 67 kg.  His HEENT  exam is normocephalic and atraumatic.  Pupils are equal and round.  The  sclerae are anicteric.  The oropharynx is moist.  Neck revealed no  jugular venous distention.  There is no thyromegaly.  Trachea is  midline.  Carotid was negative.  Lungs were clear bilaterally to  auscultation.  No wheezes, rales, or rhonchi are present.  There  is no  increased work of breathing.  The cardiovascular exam revealed a regular  rate and rhythm.  Normal S1 and S2.  There is a soft S4 gallop.  The PMI  was not enlarged nor was it widely displaced.  The abdominal exam was  soft, nontender, and nondistended.  There is no organomegaly.  Extremities demonstrated no cyanosis, clubbing, or edema.  Pulses were  2+ and symmetric.  Neurologic exam, the patient was quite anxious.  He  was alert and oriented x3.  His cranial nerves are intact.  Strength is  5/5 and symmetric.   Lab exam notable for normal creatinine, normal hemoglobin (12.9).  Normal liver panel.  Studies demonstrate CT of the pelvis with  persistent left-sided inguinal adenopathy.  He has stable splenic  infarct as well.  EKG demonstrates sinus rhythm with LVH and nonspecific  T-wave abnormality.   IMPRESSION:  1. Atypical chest pain with an abnormal EKG.  2. History of splenic infarct, questionable etiology, questionable      emboli, questionable related to  thrombosis, questionable related to      systemic inflammatory process.  3. History of Hodgkin lymphoma, status post chemotherapy and      radiation.  4. Fairly severe anxiety.   DISCUSSION:  I have recommend that the patient to undergo exercise  Myoview study.  He is to scheduled for an upcoming VATS procedure in 3  days.  If his stress test is negative, then he could go home, brought  back in for his VATS procedure if his abdominal and chest pain are  adequately controlled.  If his stress tests were abnormal, an additional  cardiac evaluation would be recommended.  The EKG is in any way  diagnostic for his cardiac sarcoid.  In addition, his echo demonstrates  normal LV function and no segmental wall motion abnormalities might be  seen in sarcoidosis of the heart.      Doylene Canning. Ladona Ridgel, MD  Electronically Signed     ______________________________  Dr. Ashley Royalty    GWT/MEDQ  D:  01/05/2008  T:  01/06/2008  Job:  [id number]   cc:   Rose Phi. Myna Hidalgo, M.D.

## 2010-06-06 NOTE — Op Note (Signed)
[name], [name]             ACCOUNT NO.:  [id number]   MEDICAL RECORD NO.:  [id number]          PATIENT TYPE:  INP   LOCATION:  2309                         FACILITY:  [facility]   PHYSICIAN:  Ines Bloomer, M.D. DATE OF BIRTH:  08-17-1970   DATE OF PROCEDURE:  DATE OF DISCHARGE:                               OPERATIVE REPORT   PREOPERATIVE DIAGNOSIS:  History of Hodgkin lymphoma, sarcoidosis,  bilateral pulmonary nodules, and splenic infarcts.   POSTOPERATIVE DIAGNOSIS:  Sarcoidosis and pulmonary nodule.   OPERATION PERFORMED:  Left video-assisted thoracic surgery and mini-  thoracotomy wedge, left lower lobe lesions x2.  After percutaneous  insertion of all monitoring lines, the patient was turned to the left  lateral thoracotomy position.  A dual-lumen tube was inserted.  The left  lung was deflated.  Two trocar sites were made in the anterior and  posterior axillary line at the seventh intercostal space.  Two trocars  were inserted.  A 0-degree scope was inserted and you could see a large  lesion in the posterolateral basilar segment of the left lower lobe.  We  then made a small 5-6 cm incision over the sixth intercostal space and  dissected down through the subcutaneous tissue, splitting the serratus.  The lesion was identified and dissected up and then used a small  Churchill retractor was inserted and then using the Covidien 45 and 60  staplers, this was wedged out.  Another lesion was found in the superior  segment of the left lower lobe and this was grasped again with a Duval  lung clamp and wedged out with 2 applications of the stapler.  CoSeal  was applied to the staple lines,.  One chest tube was placed to the  anterior trocar site and tied in place with 0 silk.  A Marcaine block  was done in the usual fashion.  A single On-Q was inserted in the usual  fashion.  The posterior trocar site was closed with 2-0 Vicryl and 3-0  Vicryl subcuticular stitch.  Finally, the  2 pericostals were placed in  the larger incision passing through the seventh ribs and around the  sixth rib, #1 Vicryl muscle in the muscle layer, 2-0 Vicryl in  subcutaneous tissue, and Dermabond for the skin.  The patient was turned  to the recovery room in stable condition.      Ines Bloomer, M.D.  Electronically Signed     DPB/MEDQ  D:  01/08/2008  T:  01/09/2008  Job:  [id number]   cc:   Rose Phi. Myna Hidalgo, M.D.

## 2010-06-06 NOTE — Op Note (Signed)
[name], [name]             ACCOUNT NO.:  [id number]   MEDICAL RECORD NO.:  [id number]          PATIENT TYPE:  AMB   LOCATION:  SDS                          FACILITY:  [facility]   PHYSICIAN:  Ines Bloomer, M.D. DATE OF BIRTH:  [birth year]   DATE OF PROCEDURE:  DATE OF DISCHARGE:  11/14/2007                               OPERATIVE REPORT   PREOPERATIVE DIAGNOSES:  Mediastinal adenopathy, history of sarcoidosis  and lymphoma.   POSTOPERATIVE DIAGNOSES:  Mediastinal adenopathy, history of sarcoidosis  and lymphoma.   OPERATION PERFORMED:  Video bronchoscopy with EBUS and mediastinoscopy.   SURGEONS:  Ines Bloomer, MD and Leslye Peer, MD   ANESTHESIA:  General anesthesia.   PROCEDURE:  After percutaneous insertion of all monitoring lines, the  patient underwent general anesthesia, was prepped and draped in the  usual sterile manner, and was turned to his prone position.  The video  bronchoscope was passed and the right upper lobe, right middle lobe, and  right lower lobe orifices were normal.  The left upper lobe and left  lower lobe orifices were normal.  Dr. Delton Coombes passed the EBUS scope and  will dictate that separately and did aspirations of 7R, 7, 4R and 4L  node.  After the EBUS scope was removed, the anterior neck and chest  were prepped and draped in the usual sterile manner.  A transverse  incision was made above the sternal notch and carried down with  electrocautery to subcutaneous tissue and fascia.  Pretracheal fascia  was entered.  This patient had a history of both sarcoidosis and  lymphoma and with the large mediastinal nodes.  The mediastinoscope was  inserted and biopsies of 4R, 4L, and 7 nodes were done.  Clips were left  in place of 1 bronchial artery.  Strap muscles were closed with 3-0  Vicryl and subcutaneous tissue with 3-0 Vicryl, and Dermabond for the  skin.  The patient was returned to the recovery room in stable  condition.      Ines Bloomer, M.D.  Electronically Signed     DPB/MEDQ  D:  11/14/2007  T:  11/15/2007  Job:  [id number]   cc:   Rose Phi. Myna Hidalgo, M.D.

## 2010-06-06 NOTE — Letter (Signed)
November 18, 2007   Arlan Organ, MD  [address]  [place], [place]  [address]   Re:  ROSALIO, CATTERTON             DOB:  1970-06-25   Dear Theron Arista:   I saw Mr Gedney back after you did EBUS  as well as mediastinoscopy on  him.  Flow cytometry and all the tests showed that there is no evidence  of any recurrent lymphoma that this all sarcoid.  His incisions are well  healed.  His blood pressure 121/79, pulse 84, respirations 18, sats were  90%.  I will see him back  again in 3 weeks for final check.   Ines Bloomer, M.D.  Electronically Signed   DPB/MEDQ  D:  11/18/2007  T:  11/19/2007  Job:  [id number]

## 2010-06-06 NOTE — Discharge Summary (Signed)
[name], [name]             ACCOUNT NO.:  [id number]   MEDICAL RECORD NO.:  [id number]          PATIENT TYPE:  INP   LOCATION:  6714                         FACILITY:  [facility]   PHYSICIAN:  Peggye Pitt, M.D. DATE OF BIRTH:  25-Sep-1970   DATE OF ADMISSION:  12/14/2007  DATE OF DISCHARGE:  12/19/2007                               DISCHARGE SUMMARY   DISCHARGE DIAGNOSES:  1. Acute splenic infarctation.  2. History of Hodgkin lymphoma.  3. History of thrombotic thrombocytopenic purpura.  4. Pulmonary sarcoidosis.   DISCHARGE MEDICATIONS:  1. Folic acid 1 mg p.o. daily.  2. Percocet 5/325 mg 1-2 tablets every 6 hours as needed for pain.  I      have dispensed 60 pills.  3. Colace 100 mg p.o. twice daily as needed for constipation.  4. Phenergan 12.5 mg p.o. q.6 h. as needed for nausea.  I have      dispensed 15 tablets of this.   DISPOSITION AND FOLLOWUP:  The patient is discharged home in a stable  condition to follow up with his oncologist, Dr. Arlan Organ.  At the  time of followup, discussion should be undertaken as to whether or not  he should have another mediastinoscopy given the ambiguity of his biopsy  and the concern that this might still be recurrent lymphoma.   CONSULTATION ON THIS HOSPITALIZATION:  Rose Phi. Myna Hidalgo, M.D. of  Oncology.   IMAGES PERFORMED DURING THIS HOSPITALIZATION:  A CT scan of the abdomen  and pelvis on December 14, 2007, that showed acute splenic infarcts with  improved left inguinal adenopathy.  He also had a CT angiogram of the  chest on December 17, 2007, that showed no evidence for acute pulmonary  embolism with improvement in mediastinal lymphadenopathy and persistent  spiculated nodular densities throughout both lungs suspicious for  lymphomatous pulmonary involvement.   A 2-D echocardiogram on December 15, 2007, consistent with a left  ventricular ejection fraction of 60%, no left ventricular regional wall  motion  abnormalities, left ventricular diastolic parameters were normal  with mildly increased aortic valve thickness with mild mitral valvular  regurgitation.   HISTORY AND PHYSICAL:  For full details, please refer to history and  physical dictated by Dr. Della Goo on date December 14, 2007, but  in brief, [name] is a 40 year old man with a history of Hodgkin  lymphoma, who was diagnosed in 1001, currently in remission who came  into the hospital in October and had significant mediastinal  lymphadenopathy and biopsy-proven sarcoidosis.  He presented to the  emergency department with complaints of severe left upper quadrant  abdominal pain with associated nausea and vomiting.  A CT scan in the  emergency department showed acute splenic infarctations.  For that  reason, we were called for admission.   HOSPITAL COURSE BY ACTIVE PROBLEMS:  1. Acute splenic infarcts:  He was initially placed on heparin.  Later      at the recommendation of Dr. Myna Hidalgo, this was discontinued.  We      performed a 2-D echo, and there was no source of intracardiac  embolus.  His abdominal pain has resolved.  He still has some mild      left lower quadrant abdominal pain which I believe is secondary to      constipation given the fact that he has not had a bowel movement      and has been on a narcotic regimen while in the hospital.  We will      discharge home on a bowel regimen as well as some Percocet to use      as needed for pain.  2. Sarcoidosis versus lymphoma:  Another issue on this hospitalization      was whether or not his pulmonary findings were lymphoma or sarcoid.      In October, he had mediastinoscopy performed by Dr. Edwyna Shell with      biopsy that showed noncaseating granulomas consistent with      sarcoidosis; however, there is still the question.  Dr. Myna Hidalgo      believes that this is unlikely to be recurrent lymphoma, and to      settle the issue, he might need a repeat  mediastinoscopy to be      performed by Dr. Edwyna Shell as an outpatient on the request of Dr.      Myna Hidalgo.  3. History of thrombotic thrombocytopenic purpura:  He does have some      mild thrombocytopenia with platelet counts in the 110-120 range.      This has been stable throughout his hospitalization.  4. Rest of chronic medical issues were not a problem on this      hospitalization.   VITAL SIGNS UPON DISCHARGE:  Blood pressure 140/89, heart rate 73,  respirations 18, O2 sats 96% on room air with a temperature of 98.9.   LABORATORY ON DAY OF DISCHARGE:  Sodium 140, potassium 3.9, chloride  104, bicarb 28, BUN 11, creatinine 1.24, glucose of 84.  WBCs 4.6,  hemoglobin 13.6, and platelets 111.      Peggye Pitt, M.D.  Electronically Signed     EH/MEDQ  D:  12/19/2007  T:  12/19/2007  Job:  [id number]   cc:   Rose Phi. Myna Hidalgo, M.D.

## 2010-06-06 NOTE — Consult Note (Signed)
[name], [name]             ACCOUNT NO.:  [id number]   MEDICAL RECORD NO.:  [id number]          PATIENT TYPE:  OUT   LOCATION:  XRAY                         FACILITY:  [facility]   PHYSICIAN:  Ines Bloomer, M.D. DATE OF BIRTH:  1970-01-28   DATE OF CONSULTATION:  DATE OF DISCHARGE:  10/07/2007                                 CONSULTATION   CHIEF COMPLAINT:  Left chest and flank pain.   HISTORY OF PRESENT ILLNESS:  This 40 year old African American male was  treated in 2001 for Hodgkin's lymphoma by Dr. Myna Hidalgo.  He underwent  chemotherapy with 4 cycles of ABVD and radiation.  As mentioned, he was  admitted for renal failure, but this was found out to be erroneous.  A  CT scan with contrast showed mediastinal adenopathy.  PET scan showed  mediastinal adenopathy that was positive as well, has left hilar  adenopathy, and left supraclavicular adenopathy.  He has had a 25-pound  weight loss and some fever, chills, prominent night sweats, and morning  nausea and vomiting over the past year.  We are asked to see him for  possible biopsy.   PAST SURGICAL HISTORY:  He had a:  1. Tonsillectomy in 2001.  2. Inguinal hernia repair at age 78.  3. His left arm had a fracture, which required open reduction and      internal fixation, and a trauma to the right second finger.   ALLERGIES:  He has no allergies.   MEDICATIONS:  Include Norvasc, Protonix, Senokot, MiraLax, Marinol,  morphine, Zofran, atropine, milk of magnesia, and heparin.   FAMILY HISTORY:  Positive for:  1. Cancer.  2. Hypertension.  3. Diabetes.   SOCIAL HISTORY:  He is married.  No alcohol intake.   REVIEW OF SYSTEMS:  GENERAL:  See history of present illness.  PULMONARY:  Left chest pain.  GI:  Left flank pain.  See history of  present illness.  GU:  See history of present illness.  NEUROLOGICAL:  Some dizziness.  No headaches, blackouts, or seizures.  See past medical  history.  PSYCHIATRIC:  No psychiatric  illnesses.  VASCULAR:  No  claudication, DVT, or TIAs.  EYE/ENT:  No changes in eyesight or  hearing.   PHYSICAL EXAMINATION:  GENERAL:  Reveals a well-developed African  American male in no acute distress.  VITAL SIGNS:  Blood pressure is 130/80, sats are 99%, pulse is 74, and  respirations are 18.  HEAD, EARS, EYES, NOSE, AND THROAT:  Unremarkable.  NECK:  There is palpable right scalene node.  LUNGS:  Clear to auscultation and percussion.  There is a previous right  Port-a-cath scar.  HEART:  Regular sinus rhythm.  No murmur.  ABDOMEN:  Soft.  No hepatosplenomegaly.  EXTREMITIES:  Pulses 2+.  No clubbing or edema.  NEUROLOGIC:  Oriented x3.  Sensory and motor intact.  Cranial nerves  intact.   IMPRESSION:  1. Mediastinal hilar adenopathy, probably recurrent Hodgkin's or non-      Hodgkin's lymphoma.  2. Liver dysfunction, etiology unknown.  3. History of right lower lobe pulmonary embolus.  4. History of  hematuria.   PLAN:  Right mediastinoscopy and/or scalene node biopsy.      Ines Bloomer, M.D.  Electronically Signed     DPB/MEDQ  D:  10/08/2007  T:  10/09/2007  Job:  [id number]

## 2010-06-06 NOTE — Consult Note (Signed)
NAMETOMMY, GOOSTREE             ACCOUNT NO.:  [id number]   MEDICAL RECORD NO.:  [id number]          PATIENT TYPE:  INP   LOCATION:  6704                         FACILITY:  [facility]   PHYSICIAN:  Aram Beecham B. Eliott Nine, M.D.DATE OF BIRTH:  December 21, 1970   DATE OF CONSULTATION:  DATE OF DISCHARGE:                                 CONSULTATION   We are asked to see this gentleman due to renal failure.  He is a 40-  year-old with a history of Hodgkin lymphoma, treated in 2001 by Dr.  Myna Hidalgo.  He has no prior history of renal failure.  He has had a fair  number of ER visits over the past several years for various complaints:  Back pain, groin pain, urethral discharge, etc.  His last ER visit prior  to today was January 2009 for low back pain and at that time his  creatinine was 1.04 and his urinalysis was entirely negative.   He presented to the emergency department today because of right-sided  pleuritic chest pain.  He reports weight loss of about 30 pounds over  the past year and feels that his eyes had turned yellow.  There may  have been some decrease in the frequency of urination and the urine has  been darker, but otherwise he has noted no blood or foaminess.  He says  he has had frequent episodes of vomiting and nausea for at least a year.   He reports constant pain in his left groin and some intermittent pain in  his back, in addition to the right sided anterior chest and shoulder  pain that brought him to the ER today.   Despite his symptoms, he has continued to work full time in [employer].   PAST MEDICAL HISTORY:  1. History of Hodgkin lymphoma, diagnosed in 2001, treated with ABVD.  2. History of tonsillitis, status post tonsillectomy in 2001.  3. History of a right lower lobe pulmonary embolism in September 2001.  4. 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  5. ER visit in February 2008 for blood in the urine, but urinalysis      at that  time was negative.   OUTPATIENT MEDICATIONS:  He does not take any medicines on a regular  basis.  He took 1 Advil today, but does not take nonsteroidals on a  regular basis.   FAMILY HISTORY:  Noncontributory.  There is no history of renal disease.   SOCIAL HISTORY:  The patient is married and has 3 sons.  He smokes 1  cigarette a day.  He drinks occasional alcohol and uses THC.  He does  not use any street drugs including cocaine.  He works in a job in [employer].   REVIEW OF SYSTEMS:  Positive for 30-pound weight loss probably over the  past year with some right-sided pleuritic chest pain with pain in his  right arm that occurred earlier today, dyspnea with exertion as well as  fatigue which he notices particularly on the job.  He has had some  intermittent nausea ever since  he had chemo back in 2001, but has had  episodes of vomiting associated with this more recently.  He has lost  his appetite and has had no appetite for some time.  He has not had any  issues with skin rash.  No edema of the lower extremities.  Possibly  some decrease in urine output and urine has been a little darker.  He  also thinks his eyes have become yellow.   PHYSICAL EXAMINATION:  GENERAL:  He is a very nice, well-developed black  male in no acute distress other than he is scared.  VITAL SIGNS:  Temperature 98 degrees, heart rate in the 50s, blood  pressure 149/99.  HEENT:  Question if sclerae are icteric versus just muddy.  NECK:  He has no neck vein distention at 30 degrees, but the neck veins  fill normally in the supine position.  He has some shotty cervical lymph  nodes.  LUNGS:  Entirely clear.  CARDIAC:  Rhythm is regular.  He is bradycardic in the 50s.  S1 and S2.  No S3.  Very soft 1/6 murmur at upper sternal border.  No pericardial  friction rub.  ABDOMEN:  Nondistended.  Bowel sounds are normally active.  I cannot  really appreciate a liver or spleen.  There is no abdominal tenderness.  He has  a little tenderness in the left groin, but no definite adenopathy  and question of a small right groin node.  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  He has no edema of the lower  extremities.  Distal pulses are 2+ and symmetric.  He has no asterixis.   LABORATORY DATA:  Hemoglobin 14.2, WBC 6000 with 75 polys, and platelets  136,000.  CMET and phosphorus are pending as are multiple serologies.  I-  STAT:  Potassium is 5.6, CO2 of 8, BUN greater than 140, potassium 13.9,  and glucose 91.   IMPRESSION:  A 40 year old black male with a history of Hodgkin lymphoma  and normal renal function with a negative urinalysis in January 2009,  who presents with renal failure, hyperkalemia, and metabolic acidosis.  He actually came to the ER because of chest pain and not related to his  symptoms of weight loss, decreased appetite, etc.  He is much less  symptomatic than I would have expected for the degree of elevation in  BUN and creatinine.  As for etiology, there are no offending drugs.  I  am concerned about the possibility of recurrent lymphoma, which could  either cause obstruction if he had bulky lymph nodes or infiltrative  disease, but without more information including urinalysis, ultrasound,  and serologic data, the differential remains broad.   RECOMMENDATIONS:  1. Isotonic sodium bicarbonate IV fluids.  2. Pan-serologies including hepatitis B and C, HIV, ANA, urine protein      creatinine ratio, urinalysis, and urine for eosinophils.  3. Renal ultrasound to look at kidney size and symmetry and rule out      the possibility of obstruction.  4. He will likely require hemodialysis, but we will reassess later      today after some of the above studies have returned.   ADDENDUM:  Additional laboratory studies from the emergency room  returned subsequent to this consultation not which did NOT show any  evidence for renal failure, with a serum creatinine of 1.2 and  no  evidence for acidosis or hyperkalemia.  A stat repeat renal profile was  done to  determine which of the two orogonal labs were correct, and the  repeat showed a creatinine of 1.1.  Therefore the initial consultation  was predicated on abnormal lab studies that DID NOT BELONG TO THIS  PATIENT.  The ED staff has been notifed of this error.  Ordered studies  to work up renal failure are cancelled.  Primary team is also made  aware.      Duke Salvia Eliott Nine, M.D.  Electronically Signed     CBD/MEDQ  D:  10/03/2007  T:  10/04/2007  Job:  [id number]

## 2010-06-06 NOTE — Op Note (Signed)
NAMEKREW, HORTMAN             ACCOUNT NO.:  [id number]   MEDICAL RECORD NO.:  [id number]          PATIENT TYPE:  INP   LOCATION:  6708                         FACILITY:  [facility]   PHYSICIAN:  Ines Bloomer, M.D. DATE OF BIRTH:  07-14-1970   DATE OF PROCEDURE:  10/09/2007  DATE OF DISCHARGE:  10/10/2007                               OPERATIVE REPORT   PREOPERATIVE DIAGNOSES:  1. Mediastinal adenopathy.  2. History of Hodgkin disease.   POSTOPERATIVE DIAGNOSES:  1. Mediastinal adenopathy.  2. History of Hodgkin disease.   OPERATION PERFORMED:  Left scalene node biopsy.   SURGEON:  Ines Bloomer, MD.   ANESTHESIA:  General anesthesia.   PROCEDURE:  After general anesthesia the patient was prepped and draped  in the usual sterile manner.  The patient weighed almost 180 pounds, and  given no adenopathy on his CAT scan which was obtained because he had  had previous history of Hodgkin disease, decided to rule out recurrence  of the Hodgkin disease.  Next prepped and draped the left neck.  Incisions were made in the supraclavicular area and dissection was  carried down reflecting the sternocleidomastoid medially and exposed the  scalene node.  The internal jugular vein was also reflected medially.  The patient had several large nodes and these were dissected out and  sent for frozen section revealed a noncaseating granuloma consistent  with sarcoid.  For this reason, it was decided to take several more of  biopsies just to be sure that there was the diagnosis.  Wounds were  closed for permanent section.  Several more lymph nodes were resected  out and the wound was closed with 3-0 Vicryl in the subcutaneous tissue  and Dermabond for the skin.  The patient tolerated the procedure well  and was returned to the recovery room in stable condition.      Ines Bloomer, M.D.  Electronically Signed     DPB/MEDQ  D:  12/13/2007  T:  12/14/2007  Job:  [id number]

## 2010-06-06 NOTE — Discharge Summary (Signed)
[name], [name]             ACCOUNT NO.:  [id number]   MEDICAL RECORD NO.:  [id number]          PATIENT TYPE:  INP   LOCATION:  6714                         FACILITY:  [facility]   PHYSICIAN:  Rose Phi. Myna Hidalgo, M.D. DATE OF BIRTH:  [birth year]   DATE OF ADMISSION:  10/24/2007  DATE OF DISCHARGE:  10/31/2007                               DISCHARGE SUMMARY   DISCHARGE DIAGNOSES:  1. Thrombotic thrombocytopenic purpura.  2. Steroid-induced hyperglycemia.  3. Coagulase-negative staphylococcus urinary tract infection.  4. Sarcoidosis.   CONDITION UPON DISCHARGE:  Stable.   ACTIVITIES:  As tolerated.   DIET:  No restrictions.  He will need to watch out with sugar intake for  right now.   DISCHARGE MEDICATIONS:  1. Folic acid 2 mg p.o. daily.  2. Percocet (5/325) one p.o. q.6 h. p.r.n.  3. Macrobid 100 mg p.o. b.i.d. x5 days.   FOLLOWUP:  The patient will follow up with Dr. Myna Hidalgo on November 03, 2007, for lab work.  Dr. Myna Hidalgo will see the patient back himself in 10  days.   HOSPITAL COURSE:  [name] was admitted on October 24, 2007, because  of concern for TTP.  He is a 40 year old gentleman, who has a recent  diagnosis of sarcoidosis.  He has a remote history of Hodgkin disease.  He was in the office a week prior to admission with thrombocytopenia.  He was placed on prednisone.  When he returned to the office on October 24, 2007, his platelet count was improved, but his blood smear showed  numerous schistocytes.  He had markedly elevated LDH.  Total bilirubin  was elevated.  His hemoglobin was dropping.   I felt that he needed to be admitted for urgent pheresis.   He was admitted to 62.  On admission, he was found to have marked  hyperglycemia.  His blood sugar was 521.  He was initially given  insulin.   He then underwent insertion of a right IJ pheresis catheter.  This was  done by Radiology.  This was done without complications.   Pheresis on October, 3, 2009.   He tolerated this pretty well.   He had a nice drop in his LDH.  His white count started going up quite  nicely.  On October 26, 2007, platelet count was 81,000.  LDH was down to  194.  His reticulocyte count was still quite high.   He was on 60 mg of prednisone on admission.  We tapered his prednisone  dose down as I did not feel that he required prednisone during his  hospital stay.   On October 27, 2007, his platelet count was up to 110,000.  His LDH  continued to drop.   On October 28, 2007, his platelet count was up to 129.  His LDH was 133.  Reticulocyte count was 10%.   He was having some diaphoresis and rigors.  We did urine culture and he  was found to coag-negative staph.  I started him on some IV vancomycin.  Blood cultures were taken and these were all negative.  I felt that he  needs to go on to monitor unit for closer evaluation.  He was  transferred to 6500.   On October 29, 2007, his hemoglobin was 10.4.  His white count was 188.  He had a BUN and creatinine, normal at 31 and 1.0.  LDH was 216.  On  October 30, 2007, his LDH was 149.  His platelet count was 222.  His  reticulocyte count was 3% on October 29, 2007.  On October 30, 2007, his  reticulocyte count was 6.8%.   He improved quite well.  We were able to transfer him back to a regular  bed.  He got pheresis on October 30, 2007.  His blood sugars did spike  which I felt was from the pheresis.   He had no fevers.  Blood pressure was fine.   Of note, I did check hemoglobin A1c on him.  His hemoglobin A1c was 5.6,  which, to me, indicated that his blood sugars were elevated because of  steroids.   He felt quite well in the morning of October 31, 2007.  He had no problem  with nausea or vomiting.  He had no diarrhea.  He was out of bed.   His vital signs, all were stable.  His blood pressure was 126/82.  He  was afebrile.   We went ahead and gave him one last dose of vancomycin IV.  I will put  him on Macrobid  as an outpatient for 5 days.   I felt that he does not require any further pheresis.  Given his  response being so quick, I just felt that we could follow him closely.   We will follow him as an outpatient for close monitoring.   On discharge, his physical exam showed no scleral icterus.  He had no  oral lesions.  There is no thrush.  Neck was supple with no adenopathy.  His lungs were clear to percussion and auscultation bilaterally.  Cardiac exam, regular rate and rhythm with no murmurs, rubs, or bruits.  Abdominal exam, soft, good bowel sounds.  He had no palpable  abdominal mass.  There is no fluid wave.  There is no palpable  hepatosplenomegaly.  Back exam, no tenderness of the spine, ribs, or  hips.  Extremities, no clubbing, cyanosis, or edema.  Skin exam, no  rashes, ecchymosis, or petechia.      Rose Phi. Myna Hidalgo, M.D.  Electronically Signed     PRE/MEDQ  D:  10/31/2007  T:  10/31/2007  Job:  [id number]   cc:   Sharlot Gowda, M.D.  Ines Bloomer, M.D.

## 2010-06-06 NOTE — Discharge Summary (Signed)
NAMEALDRICK, DERRIG             ACCOUNT NO.:  [id number]   MEDICAL RECORD NO.:  [id number]          PATIENT TYPE:  OBV   LOCATION:  4706                         FACILITY:  [facility]   PHYSICIAN:  Veverly Fells. Excell Seltzer, MD  DATE OF BIRTH:  1970/12/31   DATE OF ADMISSION:  08/15/2008  DATE OF DISCHARGE:  08/18/2008                               DISCHARGE SUMMARY   PROCEDURES:  1. CT angiogram of the chest.  2. A 2-D echocardiogram.   PRIMARY FINAL DISCHARGE DIAGNOSIS:  Chest pain, felt secondary to  stress/anxiety.   SECONDARY DIAGNOSES:  1. Anxiety.  2. Hypertension.  3. Status post advanced procedure in December 2009, with mini      thoracotomy and wedge, left lower lobe lesions x2 for sarcoid in      pulmonary nodules.  4. Sarcoidosis.  5. 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  6. History of Hodgkin lymphoma diagnosed in 2001 and in remission.  7. History of thrombotic thrombocytopenic purpura.  8. History of left elbow fracture in 2002, with surgical repair.  9. Dyslipidemia with total cholesterol of 131; triglycerides 86; HDL      33; and LDL 81, this admission.   TIME AT DISCHARGE:  43 minutes.   HOSPITAL COURSE:  Mr. [name] is a 40 year old male with no previous  history of coronary artery disease.  He came to the hospital for chest  pain that was atypical and he was admitted for further evaluation and  treatment.   A 2-D echocardiogram was performed, which showed an EF of 50% with no  regional wall motion abnormalities.  He had grade 1 diastolic  dysfunction.  A Chiari network previously visualized, was not visualized  on this echocardiogram.  He had a mobile atrial septum, but did not  fulfill criteria for atrial septal aneurysm.  The inferior vena cava was  normal in size with normal central venous pressure.  His cardiac enzymes  were negative for MI.  A CT  angiogram of the chest was performed, which  showed no evidence for PE.  There were no acute cardiopulmonary  abnormalities.  Postoperative change and pleural thickening was  identified at the left lower lobe and the right apical nodule seen on  previous exam had decreased in size.  He had some left arm pain and  numbness, so a left arm x-ray was also performed, which was negative for  any acute process.   His potassium was slightly low upon arrival, so this was fixed.  His TSH  was within normal limits on admission.  Blood cultures were performed,  but are negative to date.  He has had no fever and his white count is  within normal limits.   On August 18, 2008, Mr. Fessel was seen by Dr. Excell Seltzer.  Dr. Excell Seltzer felt  that an extensive workup had been negative.  There was a significant  association of his symptoms with emotional stress.  Dr. Excell Seltzer felt that  his symptoms were stress related and he  had been started on Effexor to  help with anxiety as well as p.r.n. medications for anxiety and pain.  He is to be on Norvasc for blood pressure control.  Dr. Excell Seltzer evaluated  Mr. Oscarson and felt that he could be considered stable for discharge  with internal medicine outpatient followup.   DISCHARGE INSTRUCTIONS:  1. His activity level is to be as tolerated.  2. He is to stick to a heart-healthy diet.   FOLLOWUP:  He is to follow up with Dr. Susann Givens and call for an  appointment.  He is to follow up with Dr. Antoine Poche as needed.   DISCHARGE MEDICATIONS:  Per the computerized discharge instruction  sheet.      Theodore Demark, PA-C      Veverly Fells. Excell Seltzer, MD  Electronically Signed    RB/MEDQ  D:  08/18/2008  T:  08/19/2008  Job:  [id number]   cc:   Sharlot Gowda, M.D.

## 2010-06-06 NOTE — Consult Note (Signed)
NAMEMALIEK, [name]             ACCOUNT NO.:  [id number]   MEDICAL RECORD NO.:  [id number]          PATIENT TYPE:  INP   LOCATION:  3736                         FACILITY:  [facility]   PHYSICIAN:  Oley Balm. Sung Amabile, MD   DATE OF BIRTH:  Mar 16, 1970   DATE OF CONSULTATION:  DATE OF DISCHARGE:                                 CONSULTATION   DATE OF CONSULTATION:  January 09, 2008.   REQUESTING PHYSICIAN:  Ines Bloomer, M.D.   REASON FOR CONSULTATION:  Sarcoidosis.   HISTORY OF PRESENT ILLNESS:  [name] is a 40 year old African  American gentleman who was diagnosed with Hodgkin's lymphoma in 2001 by  an inguinal lymph node biopsy.  He was treated for this and followed by  Dr. Myna Hidalgo.  At some point along the way, he had lost to follow up  until this past year.  He has had recurrent episodes of chest pain,  which ultimately lead to a CT scan of the chest to rule out pulmonary  embolism.  This demonstrated no evidence of pulmonary embolism, but did  show mediastinal lymphadenopathy and scattered bilateral pulmonary  nodules.  He underwent a scalene node biopsy in September of this year  which demonstrated non-necrotizing granulomatous inflammation.  He was  seen in consultation by Dr. Sandrea Hughs in October 2009 in evaluation  of this.  Dr Thurston Hole impression was that his pain was not likely related  to sarcoidosis and did not feel that he warranted any specific therapy  for sarcoidosis at that time.  Since his evaluation by Dr. Sherene Sires, he has  undergone at least 2 or 3 other CT scans of the chest, each  demonstrating mediastinal lymphadenopathy and bilateral pulmonary  nodules without evidence of progression.  In October 2009, he did  undergo a mediastinoscopy and mediastinal lymph node biopsy performed by  Dr. Edwyna Shell.  Multiple lymph nodes again demonstrated non-necrotizing  granulomatous inflammation consistent with sarcoidosis.  He was admitted  on January 04, 2008, by Dr.  Glade Lloyd with atypical chest pain.  He was  seen in consultation by Dr. Arline Asp, who noted the CT scan findings and  raised the concern for recurrent lymphoma.  Dr. Edwyna Shell saw him in  consultation and performed a left video-assisted thoracic surgery biopsy  of 2 of the lung nodules.  The frozen sections reportedly demonstrated  non-necrotizing granulomatous inflammation.  Final report on this  specimen is not presently available.  Dr. Edwyna Shell has requested that I  see the patient to evaluate for possible treatment for sarcoidosis.   PAST MEDICAL HISTORY:  Besides that described above, his past medical  history is essentially unremarkable.  There is some questionable history  of TTP in the past.   HOME MEDICATIONS:  1. Folic acid.  2. P.r.n. Percocet.  3. Colace.  4. Phenergan.   SOCIAL HISTORY:  He smokes marijuana on a daily basis and cigarettes  intermittently.  No history of excessive alcohol abuse.   FAMILY HISTORY:  Noncontributory.   REVIEW OF SYSTEMS:  Otherwise noncontributory.   PHYSICAL EXAMINATION:  Upon my evaluation, he was afebrile and with  normal vital signs and in no respiratory distress.  HEENT:  Entirely normal.  NECK:  Supple without adenopathy or jugular venous distention.  CHEST:  Normal percussion.  No throb.  Breast sounds were full without  wheezes or other adventitious sounds.  CARDIAC:  Regular rate and rhythm with no murmurs.  ABDOMEN:  Soft.  Nontender with normal bowel sounds.  EXTREMITIES:  Without clubbing, cyanosis, and edema.  NEUROLOGIC:  Grossly intact.   DATA:  CT scans of the chest over the past 3 months demonstrate findings  as described above - mediastinal lymphadenopathy which have been stable  over several scans in the past 3 to 4 months and scattered bilateral  pulmonary nodules, also stable.  Chest x-ray, upon my evaluation  demonstrated the expected postoperative changes with no definitive acute  processes.  Laboratory data was  reviewed.   IMPRESSION AND PLAN:  Sarcoidosis - he has had multiple biopsies  demonstrating non-necrotizing granulomatous inflammation without  evidence of infection to explain this.  I think we have effectively  ruled out lymphoma based on 3 separate biopsies, and I think his  diagnosis is most consistent with sarcoidosis.  He really does not have  any specific symtoms that can be attributed to sarcoidosis.  I agree  with Dr. Thurston Hole assessment in October 2009 that the chest pain that he  has had repeatedly is unlikely to be related to sarcoidosis.  I failed  to mention above that he also has splenic infarcts on CT scan of the  abdomen and this might account for some of his chest pain. The splenic  infarcts are poorly explained but could be related to sarcoidosis, I  presume.  At this point in time, I do not see an indication to initiate  therapy and certainly corticosteroids would impair his healing from  recent surgery.  He does however require regular followup with the  Pulmonary Department at [facility] and is not clear to me  whether he has had full pulmonary function tests, etc.  I discussed this  with the patient who said that he would prefer to see a physician other  than Dr. Sherene Sires.  It appears that Dr. Delton Coombes was involved at the time of  his mediastinoscopy and therefore, I will have him follow up with Dr.  Delton Coombes after discharge.      Oley Balm Sung Amabile, MD  Electronically Signed     DBS/MEDQ  D:  01/12/2008  T:  01/12/2008  Job:  [id number]   cc:   Ines Bloomer, M.D.  Leslye Peer, MD

## 2010-06-06 NOTE — Letter (Signed)
January 27, 2008   Rose Phi. Myna Hidalgo, MD  [address]  [place], [place] [address]   Re:  [name], [name]             DOB:  Feb 23, 1970   Dear [name],   The patient came today and removed his chest tube sutures.  His chest x-  ray continues to improve.  Since he had sarcoid in the lung as well as  in his left nodes, probably the splenic lesions are also sarcoid.  I had  Dr. Sung Amabile see him in the hospital and we are going to refer him to a  pulmonologist for treatment.  Dr. Sung Amabile is one of his associates.  He  had an episode of some Staph bacteremia in the hospital and he used to  get 10-day course of vancomycin.  His blood pressure is 143/93, pulse  80, respirations 18, and sats were 98%.  I will see him back again in 3  weeks with another chest x-ray.  A chest x-ray showed normal  postoperative changes.   Ines Bloomer, M.D.  Electronically Signed   DPB/MEDQ  D:  01/27/2008  T:  01/27/2008  Job:  [id number]   cc:   Oley Balm. Sung Amabile, MD

## 2010-06-06 NOTE — Discharge Summary (Signed)
[name], [name]             ACCOUNT NO.:  [id number]   MEDICAL RECORD NO.:  [id number]          PATIENT TYPE:  INP   LOCATION:  6708                         FACILITY:  [facility]   PHYSICIAN:  Peggye Pitt, M.D. DATE OF BIRTH:  [birth year]   DATE OF ADMISSION:  10/03/2007  DATE OF DISCHARGE:  10/10/2007                               DISCHARGE SUMMARY   DISCHARGE DIAGNOSES:  1. Large, bulky mediastinal lymphadenopathy:  Frozen section biopsy      consistent with sarcoidosis.  2. History of lymphoma.  3. Hyperbilirubinemia.   DISCHARGE MEDICATIONS:  Percocet 5/325 mg 1 tablet p.o. every 4 hours as  needed for pain.   DISPOSITION AND FOLLOW UP:  I have scheduled a followup for the patient  with Dr. Sandrea Hughs with  Pulmonary.  This appointment has been  scheduled for October 23, 2007, at 2:30 p.m.  The patient has just been  diagnosed with pulmonary sarcoidosis and I have scheduled this  appointment for followup of his care, as the patient does not have a  primary care physician.   IMAGES PERFORMED DURING THIS HOSPITALIZATION:  Chest x-ray on October 03, 2007, consistent with low lung volumes without evidence of acute  cardiopulmonary disease.  He also had a CT scan of the chest on  October 04, 2007, that showed bulky mediastinal adenopathy worrisome  for recurrent lymphoma.  Left hilar and upper abdominal lymph nodes are  present.  Also, scattered foci of air space consolidation in the lung.  May be infectious with pulmonary lymphoma also considered.   The patient had an ultrasound of his abdomen on October 06, 2007 that  was normal.   A PET scan that was performed on October 07, 2007 that showed evidence  for lymphoma recurrence above and below the hemidiaphragms.  Nodal  involvement include left supraclavicular, mediastinal, and left hilar  hypermetabolic adenopathy.  Adenopathy below the diaphragm includes  gastrohepatic ligament nodes, periportal  nodes, and hepatoduodenal  ligament node, likely lymphomatous involvement in the spleen as well.  Evidence for pulmonary parenchymal lymphomatous involvement with new  hypermetabolic pulmonary nodules.   Mildly hypermetabolic axillary and inguinal nodes similar to prior maybe  reactive.  The patient also had a mediastinoscopy with scalene node  biopsy performed by Dr. Edwyna Shell on October 09, 2007.  A formal  pathology report is pending.   HISTORY OF PRESENT ILLNESS:  For full details, refer to the chart.  But  in brief, [name] is a 40 year old African American man who  presented to the emergency department complaining of right-sided  pleuritic chest pain.  At that time, a creatinine was drawn and was  shown to be 13, so he was admitted for further observation.  Subsequently, a Nephrology consult by Dr. Eliott Nine was obtained who  repeated the patient's BMET and to her surprise, the creatinine was  normal.  This was rechecked and it was normal again, so it appears that  the first lab was done in error.  However, he did have a CT chest with  contrast because of his pleuritic chest pain to rule out a pulmonary  embolus, and what he did have was a large bulky mediastinal adenopathy.   HOSPITAL COURSE BY ACTIVE PROBLEM:  1. Mediastinal adenopathy with a history of Hodgkin lymphoma.  Of      course, our primary concern, was that he could have a recurrence of      his lymphoma. Oncology consultations by doctors, Dr. Arline Asp and      Dr. Myna Hidalgo were obtained.  Dr. Myna Hidalgo believed that recurrence      from non-Hodgkin's lymphoma is very unlikely and requested that Dr.      Edwyna Shell be consulted for mediastinoscopy for tissue biopsy which we      have done.  This was performed on October 09, 2007, and a      preliminary pathology report seems to be consistent with      sarcoidosis; however, final pathology reports are still not      available.  Because of this, I have scheduled the patient  to be      seen by Dr. Sandrea Hughs with [facility] for further      management.  The patient is also scheduled to see Dr. Myna Hidalgo as an      outpatient, although with this not being an oncologic problem, I do      not think Dr. Myna Hidalgo will have anything to do at this point.  2. Hyperbilirubinemia, which was 1.7, all indirect with a mildly      elevated alk phos and normal transaminases.  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   VITALS SIGNS UPON DISCHARGE:  Blood pressure 126/85, heart rate 74,  respirations 18, O2 sats 98% on room air with a temperature of 99.4.      Peggye Pitt, M.D.  Electronically Signed     EH/MEDQ  D:  10/10/2007  T:  10/11/2007  Job:  [id number]   cc:   D[facility] Salvia. Eliott Nine, M.D.  Rose Phi. Myna Hidalgo, M.D.  Charlaine Dalton. Sherene Sires, MD, FCCP

## 2010-06-06 NOTE — H&P (Signed)
[name], [name]             ACCOUNT NO.:  [id number]   MEDICAL RECORD NO.:  [id number]          PATIENT TYPE:  OBV   LOCATION:  4705                         FACILITY:  [facility]   PHYSICIAN:  Rollene Rotunda, MD, FACCDATE OF BIRTH:  September 09, 1970   DATE OF ADMISSION:  08/15/2008  DATE OF DISCHARGE:                              HISTORY & PHYSICAL   PRIMARY CARE PHYSICIAN:  Dr. Arlana Pouch   ONCOLOGIST:  Dr. Rose Phi. Myna Hidalgo, M.D.   PRIMARY CARDIOLOGIST:  Seen by Dr. Ladona Ridgel in the Hospital but no  followup.   CHIEF COMPLAINT:  Chest pain.   HISTORY OF PRESENT ILLNESS:  [name] is a 40 year old male with no  history of coronary artery disease.  He has had multiple episodes of  left-sided chest pain.  He had chest pain this a.m. shortly after  waking.  He describes it as a squeezing and cramping pain.  He had  numbness radiating down his left arm.  His symptoms began about 8:30.  He came to the above emergency room later this morning when his symptoms  did not resolve.  He arrived to the emergency room at 10:41.  He  received morphine and aspirin.  His symptoms improved and he is  currently resting comfortably.  The chest pain was associated with  nausea, diaphoresis, chills, shortness of breath, and dizziness.  He did  not have vomiting.  He states that the symptoms are similar to the  symptoms for which Dr. Ladona Ridgel saw him while he was hospitalized in  December 2009.  He has generally atypical but also some typical features  to his chest pain.  The pretest probability of coronary artery disease  given his recent negative stress test is very low.  Currently, he is  resting comfortably.   PAST MEDICAL HISTORY:  1. Chest pain prior to VATS procedure in December 2009, exercise      Myoview showing an EF of 47% and no ischemia at that time,      echocardiogram showing an EF of 55% with a Chiari malformation but      no significant valvular abnormalities.  2. 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  3. History of Hodgkin lymphoma status post chemotherapy and considered      cured.  4. History of TTP after a lymph node biopsy.  5. 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  6. Bilateral PE in 2001 after chemotherapy for Hodgkin's.   SURGICAL HISTORY:  Status post lymph node biopsy (multiple times), VATS,  bone marrow biopsy, EGD, tonsillectomy, amputation of the tip of his  right index finger and left arm surgery after trauma.   SOCIAL HISTORY:  Lives in [place] with his wife.  He is unemployed,  but was working in [employer].  He quit tobacco 2 years ago and denies alcohol  abuse but uses THC multiple times a week.   FAMILY HISTORY:  Both of his parents are living in their 35s and his  father has heart  disease but his mother and siblings do not.   REVIEW OF SYSTEMS:  He sleeps poorly and has poor appetite.  He has had  some weight loss.  He has chronic constipation.  He feels anxious and  feels that he is worrying a lot.  He has nausea on a regular basis and  episodic abdominal pain.  He vomits occasionally in the morning.  He  does not see blood in his bowel movements and has no melena.  The chest  pain is as described above.  He denies orthopnea, PND, edema, or  palpitations.  He has some cough but no wheezing.  Full 14-point review  of systems is otherwise negative.   PHYSICAL EXAMINATION:  VITAL SIGNS: Temperature is 98.0, blood pressure  146/104 initially, pulse 67, respiratory rate 20, O2 saturation 98% on  room air, repeat blood pressure 128/90.  GENERAL:  He is a well-developed, well-nourished [place] male  who is anxious.  HEENT:  Normal with conjunctivae clear.  NECK:  There is no bruits.  No lymphadenopathy, no thyromegaly, no JVD  noted.  Lymphadenopathy:  There is no palpable lymphadenopathy in the  cervical, axillary, or  supraclavicular areas.  CV:  His heart is regular in rate and rhythm with an S1, S2 and no  significant murmur, rub, or gallop is noted.  Distal pulses are intact  in all 4 extremities.  LUNGS:  Essentially clear to auscultation bilaterally.  SKIN:  No rashes or lesions are noted.  ABDOMEN:  Soft and has active bowel sounds.  He has diffuse tenderness  with some voluntary guarding but no rebound and no involuntary guarding.  EXTREMITIES:  There is no cyanosis, clubbing, or edema noted.  There is  no Osler nodes or Janeway nodules noted.  MUSCULOSKELETAL:  There is no joint deformity or effusions and no spine  or CVA tenderness.  NEURO:  He is alert and oriented.  Cranial nerves II-XII grossly intact.   Chest x-ray, no acute disease.   EKG and laboratory values are pending.   IMPRESSION:  1. [name] was seen today by Dr. Antoine Poche.  He has chest pain and      his symptoms are atypical.  He will be admitted and we will cycle      enzymes.  If they are negative, no further workup is indicated as      he had a recent negative Myoview.  2. Abdominal discomfort:  We will check an amylase and lipase.      Consider further workup if the pain continues.  3. History of bacteremia and septic emboli, possibly secondary to      endocarditis:  We will check blood cultures x2, but no antibiotics      at this time unless he has an elevated white count becomes febrile.  4. Anxiety:  This is significant and he has multiple reasons for it.      We will use p.r.n. medications and started him on Effexor daily      with the hopes of better control.      Theodore Demark, PA-C      Rollene Rotunda, MD, [facility]  Electronically Signed    RB/MEDQ  D:  08/15/2008  T:  08/16/2008  Job:  [id number]

## 2010-06-06 NOTE — Letter (Signed)
December 31, 2007   Rose Phi. Myna Hidalgo, MD  [address]  [place], [place] [address]   Re:  [name], [name]             DOB:  February 05, 1970   Dear Theron Arista,   The patient came today.  His blood pressure was 115/78, pulse 92,  respirations 18, and sats were 96%.  Lungs were clear to auscultation  and percussion.  His mediastinoscopy in scalene node sites were well  healed.  A CT scan done in November 2009 showed 2 left lower lobe  lesions, the right upper lobe and right lower lobe lesions.  He also had  a splenic infarct and he came in today complaining of some left upper  quadrant abdominal pain.  It was slightly tender to palpation.  He has  been coughing a lot.  Per our discussion, I discussed about doing a left  VATS and biopsy of these lung lesions and he is agreeable to this, so we  will plan to do this on January 08, 2008, at [facility].  We will do  a left VATS and hopefully, we can wedge out both the left lower lobe  lesions.  I hope that these will give [place] some idea of what was going on.  It seems like he has recurrent lymphoma we just cannot get it from a  diagnostic standpoint.  I appreciate the opportunity of seeing the  patient.   Sincerely.   Ines Bloomer, M.D.  Electronically Signed   DPB/MEDQ  D:  12/31/2007  T:  01/01/2008  Job:  [id number]

## 2010-06-09 NOTE — Op Note (Signed)
[place]  Patient:    [name], [name]                    MRN: [id number] Proc. Date: 03/31/99 Adm. Date:  [id number] Disc. Date: [id number] Attending:  Brandy Hale                           Operative Report  PREOPERATIVE DIAGNOSIS:  Left groin mass, left inguinal adenopathy, rule out lymphoma.  POSTOPERATIVE DIAGNOSIS:  OPERATION PERFORMED:  Biopsy left inguinal lymph node.  SURGEON:  Angelia Mould. Derrell Lolling, M.D.  ANESTHESIA:  INDICATIONS FOR PROCEDURE:  The patient is a 40 year old black male who presented to the emergency room recently for left groin pain and left lower quadrant abdominal pain.  He has had some weight loss and some nausea and vomiting.  He as evaluated by the emergency department physician and an internal medicine physician and a CT scan was obtained which showed left periaortic, left common iliac and eft inguinal adenopathy with a 4.0 cm left inguinal mass.  He was referred to me for biopsy.  Examination reveals a left inguinal mass very medially at or below the level of the external inguinal ring.  This was about 3 or 4 cm in diameter and is somewhat mobile but not freely mobile.  This is not reducible.  It is quite firm, it is consistent with pathologic lymph node.  He is brought to the operating room electively.  DESCRIPTION OF PROCEDURE:  Following the induction of general endotracheal anesthesia, the patients genitalia and left groin were prepped and draped in sterile fashion.  0.5% Marcaine with epinephrine was used as a local infiltration anesthetic.  An oblique incision was made medially and inferiorly in the left inguinal area.  This incision was about 6 cm in length.  Dissection was carried  down through the subcutaneous tissue to the deep tissues.  Several lymphatic and vascular channels were controlled with metal clips and divided.  We mobilized what appeared to be about a 4.0 cm x 2.5 cm x  2.5 cm lymph node.  This lymph node was fixed to the deep tissues and I could not completely mobilize this up into the wound.  I amputated approximately 2.5 x 2.5 x 2.0 cm piece of the more superficial aspect of this lymph node and sent it fresh to pathology.  We called the pathology lab and asked him to process this for suspicion of lymphoma.  The cut surface of the remaining lymph node was cauterized extensively.  The wound was irrigated with saline.  A small piece of Surgicel gauze was placed on the cut surface of the lymph node.  The subcutaneous tissue was closed with 3-0 Vicryl suture and the skin closed with a running subcuticular suture of 4-0 Vicryl and Steri-Strips. Clean bandages were placed and the patient taken to the recovery room in stable condition.  Estimated blood loss was about 10 cc.  Complications were none. Sponge, needle and instrument counts were correct. DD:  03/31/99 TD:  04/02/99 Job: [id number]

## 2010-06-09 NOTE — Op Note (Signed)
[facility]  Patient:    [name], [name] Visit Number: [id number] MRN: [id number]          Service Type: EMS Location: ED Attending Physician:  Ilene Qua Dictated by:   Nicki Reaper, M.D. Proc. Date: 08/08/01 Admit Date:  08/08/2001 Discharge Date: 08/08/2001                             Operative Report  PREOPERATIVE DIAGNOSIS:  Amputation of right index finger.  POSTOPERATIVE DIAGNOSIS:  Amputation of right index finger.  OPERATION:  Revision amputation of right index finger.  SURGEON:  Nicki Reaper, M.D.  ANESTHESIA:  Metacarpal block.  HISTORY:  The patient is 40 year old male who suffered an amputation to the tip of his right index finger while at work.  He is seen at request of the [facility] Emergency Room.  Examination reveals ______ through the nail bed with a volar-based loss distally.  DESCRIPTION OF PROCEDURE:  The patient was given a metacarpal block with 1% Xylocaine without epinephrine, prepped using Betadine solution.  A Penrose drain was used for tourniquet control.  A rongeur was used to rongeur the bone back.  This allowed the volar skin to be advanced dorsally.  The nail plate was proximally located.  The wound was irrigated.  The skin was then closed to the nail bed covering the entire exposed bone and this was performed with interrupted 5-0 chromic sutures.  A sterile compressive dressing and splint was applied.  The patient tolerated the procedure well.  He was discharged to home to return to the office in one week on Vicodin and Keflex. Dictated by:   Nicki Reaper, M.D. Attending Physician:  Ilene Qua DD:  08/08/01 TD:  08/12/01 Job: [id number]

## 2010-06-09 NOTE — H&P (Signed)
[facility]  Patient:    [name], [name] Visit Number: [id number] MRN: [id number]          Service Type: EMS Location: ED Attending Physician:  Ilene Qua Dictated by:   Nicki Reaper, M.D. Admit Date:  08/08/2001 Discharge Date: 08/08/2001                           History and Physical  HISTORY OF PRESENT ILLNESS:  The patient is a 40 year old left-hand dominant male who suffered a crush injury with amputation of the tip of his right index finger while at work.  He was seen at the request of the emergency room.  He works at [employer].  His hand was caught in a machine.  PAST MEDICAL HISTORY:  He has a history of Hodgkins lymphoma, which has been treated.  MEDICATIONS:  He is no longer taking any medicines.  ALLERGIES:  Has no allergies.  PHYSICAL EXAMINATION:  An amputation through the tip of his index finger volarly long with exposed bone, loss of distal nail bed.  Neurovascularly the finger is otherwise intact.  No other finger is injured.  LABORATORY DATA:  X-rays reveal a fracture through the tuft.  PLAN:  Revision amputation. Dictated by:   Nicki Reaper, M.D. Attending Physician:  Ilene Qua DD:  08/08/01 TD:  08/11/01 Job: [id number]

## 2010-06-09 NOTE — Op Note (Signed)
[place]  Patient:    [name], [name]                    MRN: [id number] Proc. Date: 07/05/99 Adm. Date:  [id number] Disc. Date: [id number] Attending:  Carlena Sax CC:         Rose Phi. Myna Hidalgo, M.D.                           Operative Report  PREOPERATIVE DIAGNOSES:  Recurrent streptococcal tonsillitis, and Hodgkins lymphoma.  POSTOPERATIVE DIAGNOSES:  Recurrent streptococcal tonsillitis, and Hodgkins lymphoma.  PROCEDURE:  Tonsillectomy.  SURGEON:  Veverly Fells. Arletha Grippe, M.D.  ANESTHESIA:  General endotracheal.  INDICATIONS FOR SURGERY:  This is a 40 year old black male patient of Dr. Arlan Organ, who has a history of undergoing treatment for Hodgkins lymphoma.  He also has a long history of recurrent strep tonsillitis.  During each episode of chemotherapy, he undergoes a significant amount of strep tonsillitis with pain, discomfort, dysphagia, and actually high fevers. Recently he became bacteremic due to strep tonsillitis.   Physical examination does show 3+ tonsils symmetrically enlarged.  Based on his history and physical examination, I have recommended proceeding with the above-noted surgical procedure.  I have discussed extensively with him the risks and benefits of surgery, including the risks of general anesthesia, bleeding, bleeding, and the normal recovery period after this type of surgery.  I have entertained any questions, answered them appropriately, and informed consent has been obtained and patient presents for the above-noted procedure.  OPERATIVE FINDINGS:  Bilateral tonsillar hypertrophy.  DETAILS OF PROCEDURE:  The patient was brought in the operating room and placed in the supine position.  General endotracheal anesthesia was administered via the anesthesiologist without complications.  The patient was administered 1 g of Ancef IV x 1 and 10 mg of Decadron IV x 1.  Head of the table was turned 90 degrees, the  patients face was draped in a standard fashion.  A Crowe-Davis mouth retractor was inserted in the oral cavity.  This was used to retract the mouth open.  A curved Allis clamp used to grasp the right tonsil and retract it medially.  The Harmonic scalpel was then used to dissect the tonsil free from the tonsillar fossa.  Bleeding from the tonsillar fossa was controlled with a combination of Harmonic scalpel and suction cautery without difficulty.  The tonsil was then transected at the tongue base, removed through the oral cavity, and sent to surgical pathology for permanent section analysis.  Next, attention was turned to the left tonsil. Identical procedure was carried out on this side compared to the right side with identical results.  After this was done, bleeding from the tonsillar fossa was controlled meticulously with suction cautery without difficulty. Both tonsillar fossae were irrigated, suctioned dry.  There was no evidence of any active bleeding.  An orogastric tube was placed, and this was used to decompress the stomach contents.  It was then removed without incident.  A total of 4 cc of 0.5% Marcaine solution and 1:200,000 epinephrine infiltrated in the anterior tonsillar pillars and tongue base bilaterally.  Crowe-Davis mouth retractor was released and brought out through the oral cavity without incident.  Fluids given during procedure:  Approximately 800 cc of crystalloid.  Estimated blood loss was less than 25 cc.  Urine output not measured.  No drains, no packs.  Specimens sent were tonsils x 2.  The  patient tolerated the procedure well without complications, was extubated in the operating room, and transferred to the recovery room in good condition. Sponge, instrument, and needle counts were correct at the end of the procedure.  Total duration of the procedure was approximately one hour.  The patient will be admitted for overnight recovery, and once he has been recovered  well, he will be sent home on July 06, 1999.  He will be sent home on Augmentin elixir 400 mg p.o. t.i.d. for 10 days, and Lortab elixir 250 cc with two  refills 10-15 cc p.o. q.4h.  He is to have light activity, post-tonsillectomy diet for two weeks.  Both he and his family were given oral and written instructions, and they are to call with any problems of bleeding, fever, vomiting, pain, reaction to medications, or any other problems.  He will follow up in the office for postoperative check on Tuesday, June 26, at 2:25 p.m. DD:  07/05/99 TD:  07/10/99 Job: [id number]

## 2010-06-09 NOTE — Discharge Summary (Signed)
[facility]  Patient:    [name], [name]                    MRN: [id number] Adm. Date:  [id number] Disc. Date: [id number] Attending:  Jim Desanctis                           Discharge Summary  DISCHARGE DIAGNOSES: 1. Fever. 2. Nausea and vomiting. 3. Likely pharyngitis. 4. Constipation. 5. History of Hodgkins disease.  CONDITION ON DISCHARGE:  Stable.  ACTIVITIES:  As tolerated.  DIET:  Without restrictions.  FOLLOW-UP:  The patient will keep his appointment to see me on Jun 21, 1999.  DISCHARGE MEDICATIONS: 1. Reglan 20 mg p.o. q.a.c. and q.h.s. 2. MiraLax 17 g p.o. b.i.d. 3. Bactrim DS one p.o. b.i.d. x 10 days. 4. Diflucan 200 mg p.o. b.i.d. x 10 days. 5. Hydrocodone as needed. 6. Majik mouthwash as needed. 7. Coumadin 1 mg p.o. q.d.  HISTORY OF PRESENT ILLNESS:  His admission history and physical are as stated in the admit note.  See that for full details.  HOSPITAL COURSE:  [name] was admitted because of fever, nausea, and vomiting.  He was also having some abdominal pain.  He felt there was some constipation.  He also had a lot of dysphagia and odynophagia.  He had markedly enlarged tonsils.  He did have all cultures drawn.  These were unremarkable.  He was initially started on Rocephin.  He did develop what appeared to be some thrush during the hospital stay.  He was started on some Diflucan.  His nausea and vomiting resolved with IV fluids and IV antiemetics.  He is followed with this following each chemotherapy.  Unfortunately, because of financial problems at home, he has not been able to get any medications.  I have given him some samples from the office, but he has run out of these.  Once we were able to get adequate antiemetic treatment for him, he improved.  When he was admitted, he was not febrile or neutropenic.  The white cell count was 4.9, hemoglobin 12.5, and platelet count 183.  His electrolytes did  show a mildly depressed potassium of 3.0.  He was given potassium through the IV. The potassium rose to 3.4.  His BUN and creatinine were all within normal limits.  His liver function tests also were within normal limits.  A chest x-ray was done, which showed no active disease.  Apparently he was complaining of a headache when he was admitted.  He was noted to have some fluid in the posterior left ethmoid sinus.  This may have indicated some kind of sinusitis. His headache resolved spontaneously while in the hospital.  We were able to get him to a point where he was eating okay.  He was getting out of bed.  He did start having bowel movements with the use of Lactulose.  I am going to put him on some MiraLax at home.  We were able to get him ready for discharge on Jun 14, 1999, and he subsequently was discharged to home. DD:  06/14/99 TD:  06/17/99 Job: [id number]

## 2010-06-09 NOTE — Procedures (Signed)
[place]. [facility]  Patient:    [name], [name]                    MRN: [id number] Proc. Date: 04/14/99 Adm. Date:  [id number] Attending:  Jim Desanctis                           Procedure Report  NATURE OF PROCEDURE:  Left posterior iliac crest bone marrow biopsy/aspirate.  DESCRIPTION OF PROCEDURE:  Mr. Savas was brought to the short stay unit.  He ad an IV placed in his right arm.  He was placed on monitor.  All vital signs were  stable.  He was premedicated with a total of 10 mg of Versed IV.  He was then placed over onto his right side.  The left posterior iliac crest region was prepped and draped in a sterile fashion.  Mr. Thieme received 10 cc of novacaine down into the periosteum.  A good aspirate and biopsy were obtained ______ without difficulties. Mr. Hardgrove remained stable throughout the entire procedure.  There were no complications. DD:  04/14/99 TD:  04/14/99 Job: [id number]

## 2010-06-09 NOTE — Discharge Summary (Signed)
[facility]  Patient:    [name], [name]                    MRN: [id number] Adm. Date:  [id number] Disc. Date: 06/28/99 Attending:  Carlena Sax CC:         Venita Lick. Pleas Koch., M.D. [facility]                           Discharge Summary  DISCHARGE DIAGNOSES: 1. Abdominal pain, likely secondary to acid reflux. 2. Possible cocaine withdrawal. 3. Tonsillitis. 4. Streptococcus bacteremia. 5. Stage IIA Hodgkins disease. 6. Status post fourth chemotherapy treatment with    Adriamycin/bleomycin/vinblastine/DTIC.  CONDITION ON DISCHARGE:  Stable.  ACTIVITIES:  As tolerated.  DIET:  The patient will eat small meals, and will sit up for two hours after eating.  DISCHARGE MEDICATIONS: 1. Prilosec 40 mg p.o. b.i.d. 2. Coumadin 1 mg p.o. q.d. 3. Reglan 10 mg p.o. 30 minutes q.h.s. and q.a.c. 4. Vicodin one-two p.o. q.6-8h. p.r.n. 5. Senokot S two p.o. b.i.d.  HISTORY OF PRESENT ILLNESS/PHYSICAL EXAMINATION:  As stated in my admission note. See this for full details.  HOSPITAL COURSE:  Mr. Carton was admitted with Hodgkins disease.  He has stage  III Hodgkins disease.  He has had a hard time with chemotherapy so far.  He had  received two cycles of ABVD.  He was discharged from the hospital and now comes  back in with a fever of 101 degrees.  He had neutropenia.  His white cell count was 150.  We went ahead and did a CT scan of his abdomen and pelvis.  This was unremarkable.  It did show a 2.0 cm x 2.6 cm left inguinal mass which is a marked improvement with his Hodgkins disease.  We did do blood cultures on him, and 1/2 blood cultures grew Streptococcus.  This I felt was consistent with his tonsillitis.  His tonsils were marked inflamed.  I did start him on Zosyn and gentamicin.  He was started on Neupogen for his neutropenia.  The neutropenia slowly resolved over a several-day period.  He had continued difficulties eating.  He kept having a  lot of abdominal pain and nausea.  I did get GI involved.  They saw him and did an upper endoscopy on him on June 26, 1999.  The endoscopy was unremarkable.  The fact that he may have some gastroesophageal reflux disease.  We also went ahead and did an upper GI with a  small follow-through.  This was done on June 28, 1999, and this was totally within normal limits.  He was started on a regimen of Prilosec and Reglan.  This seemed to help him. e was able to tolerate liquids, then subsequently a soft diet.  He realized that e is not able to take large meals all at once.  He defervesced with the temperature very quickly.  I was able to get him off of the antibiotics without difficulty.  I did find that he had cocaine in his system. I did a urine drug screen on him.  He admitted to smoking what he thought was marijuana with his friends, but I suspect was crack cocaine.  He understands the nature of the seriousness of his problem, and will not do this again.  We did go ahead and treat him with chemotherapy on Jun 22, 1999.  His body surface area was 1.81 m2  He received  Adriamycin/bleomycin/vincristine/DTIC.  He tolerated these all quite well.  He felt good starting on the afternoon of June 27, 1999.  I did have him on Catapres (0.1 mg p.o. t.i.d.), to try to help with any potential withdrawal.  We are going to go ahead and stopped this upon discharge.  DISPOSITION:  He really felt well enough to go home on June 28, 1999.  He is actually looking forward to going home. DD:  06/28/99 TD:  07/06/99 Job: [id number]

## 2010-06-09 NOTE — H&P (Signed)
[place]  Patient:    [name], [name]                      MRN: [id number] Adm. Date:  06/11/99 Attending:  Miquel Dunn. Catha Gosselin, M.D. CC:         Rose Phi. Myna Hidalgo, M.D.             Angelia Mould. Derrell Lolling, M.D.                         History and Physical  HISTORY OF PRESENT ILLNESS:  The patient is a 40 year old Hodgkins disease patient admitted for pain control.  PAST MEDICAL HISTORY:  Positive for Hodgkins disease diagnosed on March 31, 1999, by Dr. Derrell Lolling on a left inguinal lymph node biopsy.  He has been treated with ABVD (Adriamycin, bleomycin, vinblastine, dactinomycin) chemotherapy, last week given cycle #3, day #1, and next due for chemotherapy day #15.  He developed chills today and over the last three days has developed a left frontal headache and increased blurry vision.  He came to the ER today for evaluation.  Chest x-ray was negative for infiltrates, negative for chest adenopathy.  He was also found to be afebrile, temperature 97.7, blood pressure 129/86, pulse 92.  White count 4.9, hemoglobin 12.5, platelet count 182,000.  Sodium 138, potassium 3, BUN 12, creatinine 1.1, albumin 3.9, GOT of 20, GPT of 18, alkaline phosphatase 91, total bilirubin 1.4, LDH 160. Magnesium 2.  He is unable to keep food down today.  He subsequently is admitted for further evaluation.  PAST MEDICAL HISTORY:  Positive for left inguinal biopsy as noted above.  ALLERGIES:  None.  MEDICATIONS:  Ativan, Coumadin.  FAMILY HISTORY:  Not obtained.  SOCIAL HISTORY:  Nonsmoker, nondrinker.  Lives in [place] with his wife and children.  REVIEW OF SYSTEMS:  As above.  PHYSICAL EXAMINATION:  VITAL SIGNS:  His vital signs are as above, respirations 16.  HEENT:  Oral mucosa is quite dry.  NECK:  No cervical, supraclavicular, or axillary adenopathy.  HEART:  S1 and S2 without rubs or gallops.  LUNGS:  Clear to auscultation.  ABDOMEN:  Benign without  hepatosplenomegaly.  Bowel sounds active.  No inguinal adenopathy.  EXTREMITIES:  No ankle edema.  NEUROLOGIC:  He is completely oriented.  I did get a good look at his fundus on the left, which was unremarkable.  Not quite as good a look on the right. What I saw was unremarkable.  Both pupils are equal, round, regular, reactive to light.  There is no facial asymmetry.  LABORATORY DATA:  Cranial CT scan was reviewed today.  It is normal intracranially.  There is a small amount of fluid in his left ethmoid sinus but no evidence of any trauma.  No evidence of any mass effect.  This was done without contrast.  ASSESSMENT:  Headache, nausea and vomiting.  It could be a viral syndrome. It could a residual from his chemotherapy.  It could at least partly, in terms of the headache, be symptoms of his ethmoid sinusitis.  Unfortunately, he is unable to afford any medications, and he is now unable to eat.  PLAN:  We need to admit him for IV fluids, IV antiemetics, and IV pain mediation, and replete his potassium.  Social service consult will be done to see if he can apply for Medicaid.  Will put him on IV antibiotics for his sinusitis. DD:  06/11/99 TD:  06/12/99 Job: [id number]

## 2010-06-09 NOTE — Procedures (Signed)
[place]  Patient:    [name], [name]                    MRN: [id number] Proc. Date: 06/26/99 Adm. Date:  [id number] Disc. Date: [id number] Attending:  Jim Desanctis CC:         Wilhemina Bonito. Eda Keys., M.D. [facility]             Peter R. Myna Hidalgo, M.D.             Venita Lick. Pleas Koch., M.D. [facility]                           Procedure Report  PROCEDURE:  Esophagogastroduodenoscopy.  ENDOSCOPIST:  Venita Lick. Pleas Koch., M.D.  REFERRING PHYSICIAN:  Rose Phi. Myna Hidalgo, M.D.  INDICATION:  [age]-year-old African-American, previously evaluated by Dr. Wilhemina Bonito. Eda Keys., with a history of GERD, who now has refractory nausea and vomiting associated with left upper quadrant abdominal pain.  Evaluation to date has been unrevealing.  PHYSICAL EXAMINATION:  CHEST:  Clear to auscultation and percussion.  CARDIAC: Regular rate and rhythm without murmur.  NEUROLOGIC:  Alert and oriented x 3.  ANESTHESIA:  Fentanyl 125 mcg IV, Versed 12 mg IV and pharyngeal Cetacaine spray.  MONITORING:  Automated blood pressure monitor, pulse oximeter and cardiac monitor.  Low-flow oxygen was given by nasal cannula throughout the procedure. The procedure was well-tolerated with no immediate complications.  DESCRIPTION OF PROCEDURE:  After the nature of the procedure was discussed with the patient, including discussion of its risks, benefits and alternatives, he consented to proceed.  He was then comfortably sedated in the left lateral decubitus position.  The Olympus videoendoscope was inserted in the posterior pharynx and esophagus and intubated under direct visualization. The esophagus had an entirely normal endoscopic appearance.  Examination of the gastric body was normal.  A retroflexed view of the angularis, lesser curvature, cardia and fundus was normal.  The gastric antrum, gastric pylorus, duodenal bulb and descending duodenum were all normal.  Video photographs  were obtained.  The stomach was decompressed and the endoscope was withdrawn from the patient.  IMPRESSION:  Normal upper endoscopy.  RECOMMENDATION:  No clear etiology for his symptoms.  Likely has underlying GERD but has no evidence of significant esophagitis on this examination.  Will maintain Prilosec 40 mg b.i.d. along with antireflux measures. DD:  06/26/99 TD:  06/30/99 Job: [id number]

## 2010-06-09 NOTE — H&P (Signed)
[place]  Patient:    MAT, STUARD                    MRN: [id number] Adm. Date:  [id number] Attending:  Jim Desanctis CC:         Rose Phi. Myna Hidalgo, M.D.             Venita Lick. Pleas Koch., M.D. [facility]                         History and Physical  HISTORY OF PRESENT ILLNESS:  Mr. Stutsman is a 40 year old [place] man followed by Dr. Arlan Organ for mixed cellularity Hodgkins disease. The patient received chemotherapy between May 03, 1999 and May 15, 1999. He was scheduled to see Dr. Myna Hidalgo September 7, at which point restaging studies would be discussed.  However, approximately three days prior to admission the patient developed some pain in his right chest area, worse with breathing. He felt, possibly, this could be due to his sitting next to an air vent at a friends house over the weekend. He had a temperature of 100.4 at that time. He took a Tylenol, did not call, and the temperature resolved without further problems. He is having minimal to no coughing, really no purulent sputum, but he continues to have a significant pain in the right chest, worse with breathing, which does not allow him to sleep at night.  With this information, after evaluation in the office, the patient was sent for a spiral CT scan, which shows small pulmonary emboli, chiefly in the right lower lobe. Currently, the patient is being admitted for anticoagulation, and further evaluation.  PAST MEDICAL HISTORY, PAST SURGICAL HISTORY, PAST MEDICAL HISTORY, SOCIAL HISTORY:  As per prior admissions.  SOCIAL HISTORY:  As per prior admissions.  ALLERGIES:  No known drug allergies.  MEDICATIONS:  According to our chart the patient is only on Coumadin 1 mg daily, occasional Compazine and occasional over the counter analgesics.  REVIEW OF SYSTEMS:  As above. The patient has had no unusual headaches, no sinus problems, no visual changes, no significant  shortness of breath or hemoptysis, no change in bowel or bladder habits. There have been some myalgias and arthralgias, but these are not really new, according to him.  PHYSICAL EXAMINATION:  GENERAL:  A young black male who weighs 167 pounds.  VITAL SIGNS:  Temperature 97.8, pulse 100, respirations 20, blood pressure 121/87.  HEENT:  Pupils equal, round and reactive. Oropharynx clear.  LUNGS:  I do not hear any crackles, wheezes or decreased breath sounds over the lung fields.  HEART:  Regular rate and rhythm.  ABDOMEN:  Soft and nontender. Bowel sounds are positive.  MUSCULOSKELETAL:  Tenderness in the right rib cage area, but really only with fairly significant palpation. There is a little bit of tenderness over the sternum, as well, only to touch. There is no rub heard over the area where the patient is tender.  NEUROLOGIC:  Nonfocal.  LABORATORY DATA:  Lab work in the office showed a white cell count of 2.3, and ANC 0.639, hemoglobin 12.5, platelet count 328,000.  IMPRESSION AND PLAN:  A 40 year old [place] man with a history of mixed cellularity Hodgkins disease, completed chemotherapy (ABVD) September 14, 1999, now admitted with pleurisy and small right lower lobe pulmonary emboli.  We are going to admit Mr. Marro for intravenous anticoagulation and followup with lower extremity DVT evaluation. He could  be switched to Lovenox per Dr. Myna Hidalgo at his discretion, and discharged home once the patient is stable, for coumadization. We will follow the patients counts closely given his low white cell count and will make sure neutropenic precautions are followed. DD:  09/26/99 TD:  09/26/99 Job: [id number]

## 2010-06-09 NOTE — H&P (Signed)
[facility]  Patient:    [name], [name]                      MRN: [id number] Adm. Date:  06/17/99 Attending:  Rose Phi. Myna Hidalgo, M.D.                         History and Physical  REASON FOR ADMISSION: 1. Fever and neutropenia. 2. Abdominal pain. 3. Nausea and vomiting.  HISTORY OF PRESENT ILLNESS:  [name] is a 40 year old gentleman with ______ history of Hodgkins disease. He has a terrible time with chemotherapy. He has been in the hospital about 4 or 5 times in the past month or so. He was just discharged last week. He now comes in with abdominal pain, fever, neutropenia, nausea and vomiting. He has not been able to eat. Abdominal pain is left sided. He has had some diarrhea. He has had a temperature of 101.7. He first came to the emergency room and has now been admitted.  PAST MEDICAL HISTORY:  See old records.  ALLERGIES:  None.  MEDICATIONS:  See old records.  PHYSICAL EXAMINATION:  GENERAL:  This is an ill appearing black gentleman in mild distress secondary to abdominal discomfort.  VITAL SIGNS:  101. 7, pulse 130, respiratory rate 24, blood pressure is 100/70.  HEENT:  No ocular or oral lesions. His tonsils are markedly enlarged no acute distress filled with exudate.  NECK:  Palpable submandibular glands. Thyroid is not palpable.  LUNGS:  Clear bilaterally.  CARDIAC:  Tachycardic but regular. There are no murmurs, rubs, or bruits.  ABDOMEN:  Shows a soft abdomen. Bowel sounds are active. He has some tenderness over in the left part of the abdomen.  Liver and spleen are not palpable.  EXTREMITIES:  Shows no cyanosis, clubbing or edema.  NEUROLOGIC:  No focal neurological deficits.  LABORATORY DATA:  Sodium 135, potassium 3.9, BUN 11, creatinine 1.1, calcium 10.2, albumin 4.2. LFTs are within normal limits. Amylase 81, lipase is 20. Urinalysis is unremarkable. CBC shows a white cell count of 2.2 with a neutrophil count  of 150, hemoglobin is 12.9, hematocrit 37.3 and platelet count 14.  Chest x-ray shows no infiltrate. Abdominal films show no ileus. There is no obstruction. No free air.  IMPRESSION:  [name] is a 40 year old gentleman with fever and slight neutropenia. There is nausea and vomiting. There is abdominal pain. I will go ahead and admit him. I am not too sure exactly what is going on. Normal amylase and lipase generally would not suggest pancreatitis. I dont think he has an ulcer in the stomach but I will go ahead and start him on some H2 blockers. I will go ahead and do another CT scan of his abdomen and pelvis looking for any abnormalities. I cannot do a rectal exam because of his neutropenia. I will put him on broad coverage antibiotics with Zosyn and gentamicin. I will also start him on Neupogen. I will check on his CBC everyday as well as his metabolic panel.  I will seriously need to consider changing his chemotherapy in the future as he just has shown [place] he cannot tolerate this kind of treatment. DD:  06/13/99 TD:  06/17/99 Job: [id number]

## 2010-08-10 ENCOUNTER — Emergency Department (HOSPITAL_COMMUNITY): Payer: Self-pay

## 2010-08-10 ENCOUNTER — Emergency Department (HOSPITAL_COMMUNITY)
Admission: EM | Admit: 2010-08-10 | Discharge: 2010-08-10 | Disposition: A | Discharge status: Home / Self Care | Payer: Self-pay | Attending: Emergency Medicine | Admitting: Emergency Medicine

## 2010-08-10 DIAGNOSIS — D869 Sarcoidosis, unspecified: Secondary | ICD-10-CM | POA: Insufficient documentation

## 2010-08-10 DIAGNOSIS — R109 Unspecified abdominal pain: Secondary | ICD-10-CM | POA: Insufficient documentation

## 2010-08-10 DIAGNOSIS — I10 Essential (primary) hypertension: Secondary | ICD-10-CM | POA: Insufficient documentation

## 2010-08-10 DIAGNOSIS — F341 Dysthymic disorder: Secondary | ICD-10-CM | POA: Insufficient documentation

## 2010-08-10 LAB — URINALYSIS, ROUTINE W REFLEX MICROSCOPIC
Bilirubin Urine: NEGATIVE
Glucose, UA: NEGATIVE mg/dL
Leukocytes, UA: NEGATIVE
Nitrite: NEGATIVE
Specific Gravity, Urine: 1.03 (ref 1.005–1.030)
pH: 6 (ref 5.0–8.0)

## 2010-08-10 LAB — COMPREHENSIVE METABOLIC PANEL
AST: 18 U/L (ref 0–37)
Albumin: 3.9 g/dL (ref 3.5–5.2)
Alkaline Phosphatase: 98 U/L (ref 39–117)
BUN: 19 mg/dL (ref 6–23)
CO2: 28 mEq/L (ref 19–32)
Chloride: 104 mEq/L (ref 96–112)
Potassium: 3.5 mEq/L (ref 3.5–5.1)
Total Bilirubin: 0.6 mg/dL (ref 0.3–1.2)

## 2010-08-10 LAB — CBC
Hemoglobin: 14.3 g/dL (ref 13.0–17.0)
MCV: 88.9 fL (ref 78.0–100.0)
Platelets: 212 10*3/uL (ref 150–400)
RBC: 4.49 MIL/uL (ref 4.22–5.81)
WBC: 7.3 10*3/uL (ref 4.0–10.5)

## 2010-08-10 LAB — DIFFERENTIAL
Basophils Relative: 0 % (ref 0–1)
Eosinophils Relative: 2 % (ref 0–5)
Monocytes Absolute: 0.8 10*3/uL (ref 0.1–1.0)
Monocytes Relative: 11 % (ref 3–12)
Neutro Abs: 3.4 10*3/uL (ref 1.7–7.7)

## 2010-08-11 ENCOUNTER — Ambulatory Visit (INDEPENDENT_AMBULATORY_CARE_PROVIDER_SITE_OTHER): Payer: Self-pay

## 2010-08-11 ENCOUNTER — Inpatient Hospital Stay (INDEPENDENT_AMBULATORY_CARE_PROVIDER_SITE_OTHER)
Admission: RE | Admit: 2010-08-11 | Discharge: 2010-08-11 | Disposition: A | Discharge status: Home / Self Care | Payer: Self-pay | Source: Ambulatory Visit | Attending: Family Medicine | Admitting: Family Medicine

## 2010-08-11 DIAGNOSIS — R369 Urethral discharge, unspecified: Secondary | ICD-10-CM

## 2010-08-11 DIAGNOSIS — R05 Cough: Secondary | ICD-10-CM

## 2010-08-11 DIAGNOSIS — R1032 Left lower quadrant pain: Secondary | ICD-10-CM

## 2010-08-11 LAB — POCT URINALYSIS DIP (DEVICE)
Protein, ur: NEGATIVE mg/dL
Specific Gravity, Urine: >=1.03 (ref 1.005–1.030)
Urobilinogen, UA: 1 mg/dL (ref 0.0–1.0)
pH: 6 (ref 5.0–8.0)

## 2010-08-12 ENCOUNTER — Emergency Department (HOSPITAL_COMMUNITY)
Admission: EM | Admit: 2010-08-12 | Discharge: 2010-08-12 | Disposition: A | Discharge status: Home / Self Care | Payer: Self-pay | Attending: Emergency Medicine | Admitting: Emergency Medicine

## 2010-08-12 ENCOUNTER — Emergency Department (HOSPITAL_COMMUNITY): Payer: Self-pay

## 2010-08-12 DIAGNOSIS — I10 Essential (primary) hypertension: Secondary | ICD-10-CM | POA: Insufficient documentation

## 2010-08-12 DIAGNOSIS — D869 Sarcoidosis, unspecified: Secondary | ICD-10-CM | POA: Insufficient documentation

## 2010-08-12 DIAGNOSIS — N509 Disorder of male genital organs, unspecified: Secondary | ICD-10-CM | POA: Insufficient documentation

## 2010-08-12 DIAGNOSIS — R109 Unspecified abdominal pain: Secondary | ICD-10-CM | POA: Insufficient documentation

## 2010-08-12 DIAGNOSIS — R369 Urethral discharge, unspecified: Secondary | ICD-10-CM | POA: Insufficient documentation

## 2010-08-12 LAB — URINALYSIS, ROUTINE W REFLEX MICROSCOPIC
Glucose, UA: NEGATIVE mg/dL
Hgb urine dipstick: NEGATIVE
Ketones, ur: 15 mg/dL — AB
Protein, ur: NEGATIVE mg/dL

## 2010-08-14 LAB — GC/CHLAMYDIA PROBE AMP, GENITAL
Chlamydia, DNA Probe: NEGATIVE
GC Probe Amp, Genital: NEGATIVE

## 2010-08-21 ENCOUNTER — Encounter: Payer: Self-pay | Admitting: Family Medicine

## 2010-10-12 LAB — COMPREHENSIVE METABOLIC PANEL WITH GFR
Albumin: 4.1
BUN: 13
Calcium: 9.1
Chloride: 101
Creatinine, Ser: 1.04
GFR calc Af Amer: >60
Total Bilirubin: 1.3 — ABNORMAL HIGH

## 2010-10-12 LAB — DIFFERENTIAL
Basophils Absolute: 0.1
Basophils Relative: 1
Eosinophils Absolute: 0.2
Eosinophils Relative: 3
Lymphocytes Relative: 29
Lymphs Abs: 2.1
Monocytes Absolute: 0.8
Monocytes Relative: 12
Neutro Abs: 4
Neutrophils Relative %: 56

## 2010-10-12 LAB — COMPREHENSIVE METABOLIC PANEL
ALT: 19
AST: 23
Alkaline Phosphatase: 93
CO2: 26
GFR calc non Af Amer: >60
Glucose, Bld: 74
Potassium: 4
Sodium: 134 — ABNORMAL LOW
Total Protein: 7.1

## 2010-10-12 LAB — GC/CHLAMYDIA PROBE AMP, GENITAL
Chlamydia, DNA Probe: NEGATIVE
GC Probe Amp, Genital: NEGATIVE

## 2010-10-12 LAB — URINALYSIS, ROUTINE W REFLEX MICROSCOPIC
Nitrite: NEGATIVE
Specific Gravity, Urine: 1.02
pH: 6.5

## 2010-10-12 LAB — CBC
HCT: 42.9
Hemoglobin: 14.8
MCHC: 34.6
MCV: 90.7
Platelets: 224
RBC: 4.73
RDW: 13.7
WBC: 7.2

## 2010-10-12 LAB — LIPASE, BLOOD: Lipase: 39

## 2010-10-19 LAB — GC/CHLAMYDIA PROBE AMP, GENITAL: Chlamydia, DNA Probe: NEGATIVE

## 2010-10-19 LAB — POCT URINALYSIS DIP (DEVICE)
Ketones, ur: NEGATIVE
Protein, ur: NEGATIVE
Specific Gravity, Urine: <=1.005
Urobilinogen, UA: 0.2
pH: 5

## 2010-10-23 LAB — HEPATIC FUNCTION PANEL
ALT: 34
Alkaline Phosphatase: 163 — ABNORMAL HIGH
Bilirubin, Direct: 0.2
Indirect Bilirubin: 1.5 — ABNORMAL HIGH
Total Bilirubin: 1.7 — ABNORMAL HIGH

## 2010-10-23 LAB — CBC
HCT: 43.3
Hemoglobin: 14.5
MCV: 90.5
RBC: 4.78
WBC: 5.5

## 2010-10-23 LAB — BASIC METABOLIC PANEL
CO2: 29
Chloride: 95 — ABNORMAL LOW
GFR calc Af Amer: >60
Potassium: 4

## 2010-10-23 LAB — ABO/RH: ABO/RH(D): AB POS

## 2010-10-24 LAB — BASIC METABOLIC PANEL
BUN: 13
BUN: 13
BUN: 15
CO2: 27
CO2: 27
CO2: 29
CO2: 30
CO2: 30
Calcium: 8.6
Calcium: 9.1
Calcium: 9.2
Chloride: 100
Chloride: 103
Chloride: 97
Creatinine, Ser: 0.98
Creatinine, Ser: 1.07
GFR calc Af Amer: >60
GFR calc Af Amer: >60
GFR calc Af Amer: >60
GFR calc non Af Amer: >60
GFR calc non Af Amer: >60
Glucose, Bld: 125 — ABNORMAL HIGH
Glucose, Bld: 129 — ABNORMAL HIGH
Glucose, Bld: 138 — ABNORMAL HIGH
Glucose, Bld: 161 — ABNORMAL HIGH
Glucose, Bld: 59 — ABNORMAL LOW
Glucose, Bld: 82
Potassium: 3.1 — ABNORMAL LOW
Potassium: 3.4 — ABNORMAL LOW
Potassium: 3.5
Potassium: 3.7
Sodium: 136
Sodium: 137
Sodium: 138
Sodium: 138

## 2010-10-24 LAB — GLUCOSE, CAPILLARY
Glucose-Capillary: 100 — ABNORMAL HIGH
Glucose-Capillary: 109 — ABNORMAL HIGH
Glucose-Capillary: 110 — ABNORMAL HIGH
Glucose-Capillary: 119 — ABNORMAL HIGH
Glucose-Capillary: 124 — ABNORMAL HIGH
Glucose-Capillary: 131 — ABNORMAL HIGH
Glucose-Capillary: 132 — ABNORMAL HIGH
Glucose-Capillary: 146 — ABNORMAL HIGH
Glucose-Capillary: 158 — ABNORMAL HIGH
Glucose-Capillary: 170 — ABNORMAL HIGH
Glucose-Capillary: 188 — ABNORMAL HIGH
Glucose-Capillary: 207 — ABNORMAL HIGH
Glucose-Capillary: 218 — ABNORMAL HIGH
Glucose-Capillary: 224 — ABNORMAL HIGH
Glucose-Capillary: 229 — ABNORMAL HIGH
Glucose-Capillary: 242 — ABNORMAL HIGH
Glucose-Capillary: 272 — ABNORMAL HIGH
Glucose-Capillary: 312 — ABNORMAL HIGH
Glucose-Capillary: 362 — ABNORMAL HIGH
Glucose-Capillary: 388 — ABNORMAL HIGH
Glucose-Capillary: 521
Glucose-Capillary: 56 — ABNORMAL LOW
Glucose-Capillary: 81

## 2010-10-24 LAB — THERAPEUTIC PLASMA EXCHANGE (BLOOD BANK)
Plasma Exchange: 3200
Plasma Exchange: 3200
Plasma Exchange: 3400

## 2010-10-24 LAB — CBC
HCT: 24.8 — ABNORMAL LOW
HCT: 24.8 — ABNORMAL LOW
HCT: 25.2 — ABNORMAL LOW
HCT: 26.3 — ABNORMAL LOW
Hemoglobin: 10.5 — ABNORMAL LOW
Hemoglobin: 11.4 g/dL — ABNORMAL LOW (ref 13.0–17.0)
Hemoglobin: 8.6 — ABNORMAL LOW
Hemoglobin: 8.7 — ABNORMAL LOW
Hemoglobin: 9.1 — ABNORMAL LOW
Hemoglobin: 9.1 — ABNORMAL LOW
MCHC: 33.7 g/dL (ref 30.0–36.0)
MCHC: 34.4
MCHC: 34.6
MCHC: 34.7
MCHC: 34.8
MCHC: 34.8
MCHC: 35
MCV: 92.7
MCV: 94.6 fL (ref 78.0–100.0)
MCV: 94.7
MCV: 94.9
MCV: 95.3
Platelets: 129 — ABNORMAL LOW
Platelets: 222
Platelets: 79 — ABNORMAL LOW
RBC: 2.76 — ABNORMAL LOW
RBC: 2.77 — ABNORMAL LOW
RBC: 3.16 — ABNORMAL LOW
RDW: 15.6 % — ABNORMAL HIGH (ref 11.5–15.5)
RDW: 18.4 — ABNORMAL HIGH
RDW: 18.6 — ABNORMAL HIGH
RDW: 19.1 — ABNORMAL HIGH
RDW: 19.5 — ABNORMAL HIGH
RDW: 19.7 — ABNORMAL HIGH
RDW: 19.7 — ABNORMAL HIGH
WBC: 11 — ABNORMAL HIGH
WBC: 9.5

## 2010-10-24 LAB — COMPREHENSIVE METABOLIC PANEL
ALT: 22 U/L (ref 0–53)
ALT: 32
Calcium: 9.4
Calcium: 9.8 mg/dL (ref 8.4–10.5)
Creatinine, Ser: 1.13 mg/dL (ref 0.4–1.5)
GFR calc Af Amer: >60
GFR calc Af Amer: >60 mL/min (ref >60)
GFR calc non Af Amer: >60
GFR calc non Af Amer: >60 mL/min (ref >60)
Glucose, Bld: 726
Glucose, Bld: 87 mg/dL (ref 70–99)
Sodium: 127 — ABNORMAL LOW
Sodium: 143 mEq/L (ref 135–145)
Total Bilirubin: 1.3 — ABNORMAL HIGH
Total Protein: 6.4 g/dL (ref 6.0–8.3)
Total Protein: 7.1

## 2010-10-24 LAB — URINALYSIS, ROUTINE W REFLEX MICROSCOPIC
Glucose, UA: >1000 — AB
Hgb urine dipstick: NEGATIVE
Ketones, ur: NEGATIVE
Protein, ur: NEGATIVE
Urobilinogen, UA: 1

## 2010-10-24 LAB — RETICULOCYTES
RBC.: 2.92 — ABNORMAL LOW
RBC.: 2.96 — ABNORMAL LOW
Retic Count, Absolute: 236 — ABNORMAL HIGH
Retic Count, Absolute: 353.3 — ABNORMAL HIGH
Retic Ct Pct: 10.3 — ABNORMAL HIGH
Retic Ct Pct: 12.1 — ABNORMAL HIGH
Retic Ct Pct: 6.8 — ABNORMAL HIGH

## 2010-10-24 LAB — CULTURE, RESPIRATORY W GRAM STAIN: Gram Stain: NONE SEEN

## 2010-10-24 LAB — FUNGUS CULTURE W SMEAR: Fungal Smear: NONE SEEN

## 2010-10-24 LAB — DIFFERENTIAL
Basophils Absolute: 0
Basophils Absolute: 0
Basophils Relative: 0
Basophils Relative: 0
Eosinophils Absolute: 0
Eosinophils Relative: 0
Eosinophils Relative: 0
Lymphocytes Relative: 2 — ABNORMAL LOW
Monocytes Absolute: 0.4
Monocytes Absolute: 0.5
Neutro Abs: 17.6 — ABNORMAL HIGH
Neutro Abs: 5.2

## 2010-10-24 LAB — CULTURE, BLOOD (ROUTINE X 2)
Culture: NO GROWTH
Culture: NO GROWTH

## 2010-10-24 LAB — TYPE AND SCREEN
ABO/RH(D): AB POS
Antibody Screen: NEGATIVE

## 2010-10-24 LAB — URINE CULTURE: Special Requests: NEGATIVE

## 2010-10-24 LAB — LACTATE DEHYDROGENASE
LDH: 133
LDH: 149
LDH: 216
LDH: 415 — ABNORMAL HIGH

## 2010-10-24 LAB — HEPATITIS B SURFACE ANTIGEN: Hepatitis B Surface Ag: NEGATIVE

## 2010-10-24 LAB — POCT I-STAT 3, ART BLOOD GAS (G3+)
Acid-Base Excess: 1
Bicarbonate: 24.9 — ABNORMAL HIGH
O2 Saturation: 97
Patient temperature: 98.6
TCO2: 26

## 2010-10-24 LAB — PROTIME-INR: Prothrombin Time: 13.4 seconds (ref 11.6–15.2)

## 2010-10-24 LAB — AFB CULTURE WITH SMEAR (NOT AT ARMC)

## 2010-10-24 LAB — APTT: aPTT: 24 seconds (ref 24–37)

## 2010-10-25 LAB — CBC
HCT: 37.9 — ABNORMAL LOW
HCT: 40.1
HCT: 40.6
HCT: 40.7
HCT: 41.5
HCT: 42
HCT: 45.1
Hemoglobin: 13
Hemoglobin: 13.6
Hemoglobin: 14.4
MCHC: 35.1
MCHC: 34.2
MCHC: 34.5
MCV: 88.5
MCV: 88.7
MCV: 88.2
MCV: 89.8
Platelets: 121 — ABNORMAL LOW
Platelets: 122 — ABNORMAL LOW
Platelets: 122 — ABNORMAL LOW
Platelets: 159
Platelets: 136 — ABNORMAL LOW
Platelets: 140 — ABNORMAL LOW
Platelets: 151
RBC: 4.56
RBC: 4.64
RBC: 4.75
RDW: 14.2
RDW: 14.5
RDW: 14.6
RDW: 14.7
RDW: 13.8
RDW: 13.9
WBC: 4.7
WBC: 8.5
WBC: 5.6
WBC: 5.7
WBC: 6

## 2010-10-25 LAB — COMPREHENSIVE METABOLIC PANEL
ALT: 17
ALT: 19
AST: 33
AST: 25
AST: 31
Albumin: 4.4
Albumin: 3.8
Alkaline Phosphatase: 107
Alkaline Phosphatase: 138 — ABNORMAL HIGH
Alkaline Phosphatase: 155 — ABNORMAL HIGH
BUN: 11
BUN: 10
BUN: 9
CO2: 28
CO2: 26
Chloride: 103
Chloride: 104
Chloride: 107
Chloride: 98
Creatinine, Ser: 1.14
Creatinine, Ser: 1.2
Creatinine, Ser: 1.26
GFR calc Af Amer: >60
GFR calc non Af Amer: >60
GFR calc non Af Amer: >60
Glucose, Bld: 84
Glucose, Bld: 87
Glucose, Bld: 89
Potassium: 3.9
Potassium: 3.5
Potassium: 3.7
Sodium: 140
Sodium: 135
Total Bilirubin: 1.1
Total Bilirubin: 1.3 — ABNORMAL HIGH
Total Bilirubin: 1.4 — ABNORMAL HIGH
Total Bilirubin: 1.6 — ABNORMAL HIGH
Total Protein: 7.3
Total Protein: 7.2
Total Protein: 8.1

## 2010-10-25 LAB — RENAL FUNCTION PANEL
Albumin: 3.8
BUN: 11
CO2: 29
Calcium: 9.4
Calcium: 9.4
Chloride: 105
Creatinine, Ser: 1.17
Creatinine, Ser: 1.2
GFR calc Af Amer: >60
GFR calc non Af Amer: >60
Glucose, Bld: 85
Phosphorus: 3.2
Sodium: 141

## 2010-10-25 LAB — POCT I-STAT, CHEM 8
BUN: 15
BUN: >140 — ABNORMAL HIGH
Calcium, Ion: 1.32
Chloride: 102
Creatinine, Ser: 1.3
Creatinine, Ser: 13.9 — ABNORMAL HIGH
Glucose, Bld: 108 — ABNORMAL HIGH
HCT: 45
Hemoglobin: 15.3
Hemoglobin: 10.2 — ABNORMAL LOW
Potassium: 3.7
Potassium: 5.6 — ABNORMAL HIGH
Sodium: 140
Sodium: 132 — ABNORMAL LOW
TCO2: 32
TCO2: 8

## 2010-10-25 LAB — DIFFERENTIAL
Basophils Absolute: 0
Eosinophils Absolute: 0.1
Eosinophils Relative: 1
Lymphocytes Relative: 15
Lymphocytes Relative: 14
Lymphs Abs: 1.3
Lymphs Abs: 0.8
Monocytes Absolute: 0.5
Monocytes Relative: 6
Monocytes Relative: 9
Neutro Abs: 4.4
Neutrophils Relative %: 75

## 2010-10-25 LAB — CREATININE, URINE, RANDOM: Creatinine, Urine: 71.6

## 2010-10-25 LAB — BASIC METABOLIC PANEL
BUN: 7
BUN: 8
BUN: 9
CO2: 25
CO2: 27
CO2: 30
Calcium: 9.3
Calcium: 9.5
Calcium: 9.8
Chloride: 101
GFR calc Af Amer: >60
GFR calc non Af Amer: >60
GFR calc non Af Amer: >60
GFR calc non Af Amer: >60
Glucose, Bld: 116 — ABNORMAL HIGH
Glucose, Bld: 78
Glucose, Bld: 79
Potassium: 3.1 — ABNORMAL LOW
Potassium: 4.2
Potassium: 3.8
Sodium: 138
Sodium: 139

## 2010-10-25 LAB — URINALYSIS, MICROSCOPIC ONLY
Bilirubin Urine: NEGATIVE
Glucose, UA: NEGATIVE
Hgb urine dipstick: NEGATIVE
Specific Gravity, Urine: 1.011

## 2010-10-25 LAB — POCT CARDIAC MARKERS
CKMB, poc: 2.7
Myoglobin, poc: 65.8
Myoglobin, poc: 76.4

## 2010-10-25 LAB — URINALYSIS, ROUTINE W REFLEX MICROSCOPIC
Glucose, UA: NEGATIVE
Hgb urine dipstick: NEGATIVE
Ketones, ur: NEGATIVE
Protein, ur: NEGATIVE

## 2010-10-25 LAB — CULTURE, BLOOD (ROUTINE X 2)
Culture: NO GROWTH
Culture: NO GROWTH

## 2010-10-25 LAB — PHOSPHORUS: Phosphorus: 3.2

## 2010-10-25 LAB — HEPATITIS PANEL, ACUTE
Hep B C IgM: NEGATIVE
Hepatitis B Surface Ag: NEGATIVE

## 2010-10-25 LAB — APTT: aPTT: 26

## 2010-10-25 LAB — TSH: TSH: 0.673

## 2010-10-25 LAB — SALICYLATE LEVEL: Salicylate Lvl: <4

## 2010-10-25 LAB — IMMUNOFIXATION ELECTROPHORESIS
IgM, Serum: 34 — ABNORMAL LOW
Total Protein ELP: 6.8

## 2010-10-25 LAB — D-DIMER, QUANTITATIVE: D-Dimer, Quant: 0.42

## 2010-10-25 LAB — URINE CULTURE: Culture: NO GROWTH

## 2010-10-25 LAB — HEPARIN LEVEL (UNFRACTIONATED): Heparin Unfractionated: 0.58

## 2010-10-25 LAB — MAGNESIUM: Magnesium: 2.1

## 2010-10-25 LAB — GC/CHLAMYDIA PROBE AMP, GENITAL: Chlamydia, DNA Probe: NEGATIVE

## 2010-10-25 LAB — CK TOTAL AND CKMB (NOT AT ARMC): Relative Index: 1.5

## 2010-10-25 LAB — LACTIC ACID, PLASMA: Lactic Acid, Venous: 0.9

## 2010-10-25 LAB — PARVOVIRUS B19 ANTIBODY, IGG AND IGM: Parovirus B19 IgG Abs: 1.1 Index — ABNORMAL HIGH (ref <0.9)

## 2010-10-25 LAB — LACTATE DEHYDROGENASE: LDH: 219

## 2010-10-25 LAB — SEDIMENTATION RATE: Sed Rate: 17 — ABNORMAL HIGH

## 2010-10-25 LAB — SODIUM, URINE, RANDOM: Sodium, Ur: 134

## 2010-10-27 LAB — GLUCOSE, CAPILLARY
Glucose-Capillary: 102 mg/dL — ABNORMAL HIGH (ref 70–99)
Glucose-Capillary: 106 mg/dL — ABNORMAL HIGH (ref 70–99)
Glucose-Capillary: 108 mg/dL — ABNORMAL HIGH (ref 70–99)
Glucose-Capillary: 113 mg/dL — ABNORMAL HIGH (ref 70–99)
Glucose-Capillary: 116 mg/dL — ABNORMAL HIGH (ref 70–99)
Glucose-Capillary: 117 mg/dL — ABNORMAL HIGH (ref 70–99)
Glucose-Capillary: 122 mg/dL — ABNORMAL HIGH (ref 70–99)
Glucose-Capillary: 122 mg/dL — ABNORMAL HIGH (ref 70–99)
Glucose-Capillary: 128 mg/dL — ABNORMAL HIGH (ref 70–99)
Glucose-Capillary: 134 mg/dL — ABNORMAL HIGH (ref 70–99)
Glucose-Capillary: 156 mg/dL — ABNORMAL HIGH (ref 70–99)
Glucose-Capillary: 173 mg/dL — ABNORMAL HIGH (ref 70–99)
Glucose-Capillary: 85 mg/dL (ref 70–99)
Glucose-Capillary: 85 mg/dL (ref 70–99)
Glucose-Capillary: 89 mg/dL (ref 70–99)
Glucose-Capillary: 96 mg/dL (ref 70–99)
Glucose-Capillary: 96 mg/dL (ref 70–99)
Glucose-Capillary: 99 mg/dL (ref 70–99)

## 2010-10-27 LAB — DIFFERENTIAL
Basophils Absolute: 0 10*3/uL (ref 0.0–0.1)
Basophils Relative: 0 % (ref 0–1)
Eosinophils Absolute: 0.1 10*3/uL (ref 0.0–0.7)
Eosinophils Absolute: 0.2 10*3/uL (ref 0.0–0.7)
Lymphocytes Relative: 17 % (ref 12–46)
Lymphs Abs: 1.4 10*3/uL (ref 0.7–4.0)
Monocytes Relative: 10 % (ref 3–12)
Neutro Abs: 5.9 10*3/uL (ref 1.7–7.7)
Neutrophils Relative %: 57 % (ref 43–77)
Neutrophils Relative %: 73 % (ref 43–77)

## 2010-10-27 LAB — POCT I-STAT, CHEM 8
BUN: 18 mg/dL (ref 6–23)
Calcium, Ion: 1.22 mmol/L (ref 1.12–1.32)
Chloride: 107 mEq/L (ref 96–112)
Glucose, Bld: 127 mg/dL — ABNORMAL HIGH (ref 70–99)
HCT: 36 % — ABNORMAL LOW (ref 39.0–52.0)
HCT: 46 % (ref 39.0–52.0)
Hemoglobin: 15.6 g/dL (ref 13.0–17.0)
Potassium: 3.4 mEq/L — ABNORMAL LOW (ref 3.5–5.1)
Potassium: 3.8 mEq/L (ref 3.5–5.1)

## 2010-10-27 LAB — COMPREHENSIVE METABOLIC PANEL
ALT: 12 U/L (ref 0–53)
ALT: 20 U/L (ref 0–53)
AST: 22 U/L (ref 0–37)
Albumin: 3.3 g/dL — ABNORMAL LOW (ref 3.5–5.2)
Alkaline Phosphatase: 134 U/L — ABNORMAL HIGH (ref 39–117)
Alkaline Phosphatase: 89 U/L (ref 39–117)
CO2: 27 mEq/L (ref 19–32)
CO2: 28 mEq/L (ref 19–32)
CO2: 30 mEq/L (ref 19–32)
Calcium: 9.8 mg/dL (ref 8.4–10.5)
Chloride: 100 mEq/L (ref 96–112)
Chloride: 103 mEq/L (ref 96–112)
Creatinine, Ser: 1.02 mg/dL (ref 0.4–1.5)
Creatinine, Ser: 1.06 mg/dL (ref 0.4–1.5)
GFR calc Af Amer: >60 mL/min (ref >60)
GFR calc non Af Amer: >60 mL/min (ref >60)
GFR calc non Af Amer: >60 mL/min (ref >60)
Glucose, Bld: 148 mg/dL — ABNORMAL HIGH (ref 70–99)
Glucose, Bld: 89 mg/dL (ref 70–99)
Potassium: 3.5 mEq/L (ref 3.5–5.1)
Potassium: 3.9 mEq/L (ref 3.5–5.1)
Sodium: 140 mEq/L (ref 135–145)
Total Bilirubin: 0.7 mg/dL (ref 0.3–1.2)
Total Bilirubin: 1.2 mg/dL (ref 0.3–1.2)
Total Protein: 7.4 g/dL (ref 6.0–8.3)

## 2010-10-27 LAB — CBC
HCT: 32 % — ABNORMAL LOW (ref 39.0–52.0)
HCT: 32 % — ABNORMAL LOW (ref 39.0–52.0)
HCT: 37.5 % — ABNORMAL LOW (ref 39.0–52.0)
HCT: 39.6 % (ref 39.0–52.0)
HCT: 45.4 % (ref 39.0–52.0)
Hemoglobin: 10.7 g/dL — ABNORMAL LOW (ref 13.0–17.0)
Hemoglobin: 11.2 g/dL — ABNORMAL LOW (ref 13.0–17.0)
Hemoglobin: 12.9 g/dL — ABNORMAL LOW (ref 13.0–17.0)
Hemoglobin: 13.4 g/dL (ref 13.0–17.0)
Hemoglobin: 13.6 g/dL (ref 13.0–17.0)
Hemoglobin: 15.4 g/dL (ref 13.0–17.0)
MCHC: 33.3 g/dL (ref 30.0–36.0)
MCHC: 34.2 g/dL (ref 30.0–36.0)
MCHC: 34.3 g/dL (ref 30.0–36.0)
MCV: 85.2 fL (ref 78.0–100.0)
MCV: 85.3 fL (ref 78.0–100.0)
MCV: 85.7 fL (ref 78.0–100.0)
MCV: 88.8 fL (ref 78.0–100.0)
Platelets: 125 10*3/uL — ABNORMAL LOW (ref 150–400)
Platelets: 144 10*3/uL — ABNORMAL LOW (ref 150–400)
Platelets: 161 10*3/uL (ref 150–400)
RBC: 3.76 MIL/uL — ABNORMAL LOW (ref 4.22–5.81)
RBC: 3.78 MIL/uL — ABNORMAL LOW (ref 4.22–5.81)
RBC: 3.82 MIL/uL — ABNORMAL LOW (ref 4.22–5.81)
RBC: 4.49 MIL/uL (ref 4.22–5.81)
RBC: 4.59 MIL/uL (ref 4.22–5.81)
RDW: 14.2 % (ref 11.5–15.5)
RDW: 14.5 % (ref 11.5–15.5)
RDW: 14.6 % (ref 11.5–15.5)
RDW: 14.6 % (ref 11.5–15.5)
WBC: 10.1 10*3/uL (ref 4.0–10.5)
WBC: 10.8 10*3/uL — ABNORMAL HIGH (ref 4.0–10.5)
WBC: 4.9 10*3/uL (ref 4.0–10.5)
WBC: 7.5 10*3/uL (ref 4.0–10.5)
WBC: 9.2 10*3/uL (ref 4.0–10.5)
WBC: 9.5 10*3/uL (ref 4.0–10.5)

## 2010-10-27 LAB — BASIC METABOLIC PANEL
BUN: 10 mg/dL (ref 6–23)
BUN: 10 mg/dL (ref 6–23)
BUN: 11 mg/dL (ref 6–23)
BUN: 11 mg/dL (ref 6–23)
BUN: 17 mg/dL (ref 6–23)
CO2: 26 mEq/L (ref 19–32)
Calcium: 9 mg/dL (ref 8.4–10.5)
Calcium: 9.6 mg/dL (ref 8.4–10.5)
Chloride: 100 mEq/L (ref 96–112)
Chloride: 99 mEq/L (ref 96–112)
Creatinine, Ser: 1 mg/dL (ref 0.4–1.5)
Creatinine, Ser: 1.02 mg/dL (ref 0.4–1.5)
Creatinine, Ser: 1.1 mg/dL (ref 0.4–1.5)
GFR calc Af Amer: >60 mL/min (ref >60)
GFR calc non Af Amer: >60 mL/min (ref >60)
GFR calc non Af Amer: >60 mL/min (ref >60)
GFR calc non Af Amer: >60 mL/min (ref >60)
Glucose, Bld: 105 mg/dL — ABNORMAL HIGH (ref 70–99)
Glucose, Bld: 187 mg/dL — ABNORMAL HIGH (ref 70–99)
Glucose, Bld: 76 mg/dL (ref 70–99)
Potassium: 3.5 mEq/L (ref 3.5–5.1)
Potassium: 3.6 mEq/L (ref 3.5–5.1)
Potassium: 4.7 mEq/L (ref 3.5–5.1)

## 2010-10-27 LAB — CK TOTAL AND CKMB (NOT AT ARMC): CK, MB: 0.9 ng/mL (ref 0.3–4.0)

## 2010-10-27 LAB — CARDIAC PANEL(CRET KIN+CKTOT+MB+TROPI)
CK, MB: 0.6 ng/mL (ref 0.3–4.0)
Relative Index: INVALID (ref 0.0–2.5)
Relative Index: INVALID (ref 0.0–2.5)
Total CK: 79 U/L (ref 7–232)
Total CK: 79 U/L (ref 7–232)
Troponin I: 0.05 ng/mL (ref 0.00–0.06)

## 2010-10-27 LAB — URINALYSIS, ROUTINE W REFLEX MICROSCOPIC
Bilirubin Urine: NEGATIVE
Glucose, UA: NEGATIVE mg/dL
Glucose, UA: NEGATIVE mg/dL
Ketones, ur: NEGATIVE mg/dL
Nitrite: NEGATIVE
Nitrite: NEGATIVE
Specific Gravity, Urine: 1.02 (ref 1.005–1.030)
Specific Gravity, Urine: 1.022 (ref 1.005–1.030)
pH: 6.5 (ref 5.0–8.0)
pH: 6.5 (ref 5.0–8.0)

## 2010-10-27 LAB — CROSSMATCH: ABO/RH(D): AB POS

## 2010-10-27 LAB — LIPID PANEL
Triglycerides: 83 mg/dL (ref <150)
VLDL: 17 mg/dL (ref 0–40)

## 2010-10-27 LAB — HEPATIC FUNCTION PANEL
ALT: 17 U/L (ref 0–53)
AST: 22 U/L (ref 0–37)
Albumin: 3.7 g/dL (ref 3.5–5.2)
Indirect Bilirubin: 0.6 mg/dL (ref 0.3–0.9)
Total Protein: 6.8 g/dL (ref 6.0–8.3)

## 2010-10-27 LAB — AFB CULTURE WITH SMEAR (NOT AT ARMC): Acid Fast Smear: NONE SEEN

## 2010-10-27 LAB — FUNGUS CULTURE W SMEAR: Fungal Smear: NONE SEEN

## 2010-10-27 LAB — TISSUE CULTURE: Culture: NO GROWTH

## 2010-10-27 LAB — TROPONIN I: Troponin I: 0.07 ng/mL — ABNORMAL HIGH (ref 0.00–0.06)

## 2010-10-27 LAB — LIPASE, BLOOD: Lipase: 28 U/L (ref 11–59)

## 2010-10-27 LAB — CULTURE, BLOOD (ROUTINE X 2)

## 2010-10-27 LAB — POCT I-STAT 3, ART BLOOD GAS (G3+)
Bicarbonate: 25.6 mEq/L — ABNORMAL HIGH (ref 20.0–24.0)
pH, Arterial: 7.352 (ref 7.350–7.450)
pO2, Arterial: 113 mmHg — ABNORMAL HIGH (ref 80.0–100.0)

## 2010-12-20 ENCOUNTER — Observation Stay (HOSPITAL_COMMUNITY)
Admission: EM | Admit: 2010-12-20 | Discharge: 2010-12-20 | Disposition: A | Discharge status: Home / Self Care | Payer: Self-pay | Attending: Emergency Medicine | Admitting: Emergency Medicine

## 2010-12-20 ENCOUNTER — Encounter (HOSPITAL_COMMUNITY): Payer: Self-pay | Admitting: Emergency Medicine

## 2010-12-20 ENCOUNTER — Other Ambulatory Visit: Payer: Self-pay

## 2010-12-20 ENCOUNTER — Emergency Department (HOSPITAL_COMMUNITY): Payer: Self-pay

## 2010-12-20 DIAGNOSIS — Z8571 Personal history of Hodgkin lymphoma: Secondary | ICD-10-CM | POA: Insufficient documentation

## 2010-12-20 DIAGNOSIS — R079 Chest pain, unspecified: Principal | ICD-10-CM | POA: Insufficient documentation

## 2010-12-20 DIAGNOSIS — I10 Essential (primary) hypertension: Secondary | ICD-10-CM | POA: Insufficient documentation

## 2010-12-20 LAB — TROPONIN I: Troponin I: <0.3 ng/mL (ref <0.30)

## 2010-12-20 LAB — CBC
HCT: 38 % — ABNORMAL LOW (ref 39.0–52.0)
Hemoglobin: 12.9 g/dL — ABNORMAL LOW (ref 13.0–17.0)
MCHC: 33.9 g/dL (ref 30.0–36.0)
WBC: 5.6 10*3/uL (ref 4.0–10.5)

## 2010-12-20 LAB — BASIC METABOLIC PANEL
BUN: 17 mg/dL (ref 6–23)
CO2: 28 mEq/L (ref 19–32)
Chloride: 108 mEq/L (ref 96–112)
Creatinine, Ser: 1.08 mg/dL (ref 0.50–1.35)
Glucose, Bld: 93 mg/dL (ref 70–99)

## 2010-12-20 LAB — DIFFERENTIAL
Lymphocytes Relative: 26 % (ref 12–46)
Monocytes Absolute: 0.5 10*3/uL (ref 0.1–1.0)
Monocytes Relative: 9 % (ref 3–12)
Neutro Abs: 3.5 10*3/uL (ref 1.7–7.7)

## 2010-12-20 LAB — CARDIAC PANEL(CRET KIN+CKTOT+MB+TROPI): Relative Index: 1.1 (ref 0.0–2.5)

## 2010-12-20 MED ORDER — ATORVASTATIN CALCIUM 10 MG PO TABS: 20.0000 mg | ORAL_TABLET | Freq: Every day | ORAL | Status: DC

## 2010-12-20 MED ORDER — IOHEXOL 350 MG/ML SOLN
80.0000 mL | Freq: Once | INTRAVENOUS | Status: AC | PRN
  Administered 2010-12-20: 80 mL via INTRAVENOUS

## 2010-12-20 MED ORDER — METOPROLOL TARTRATE 1 MG/ML IV SOLN
INTRAVENOUS | Status: AC
  Filled 2010-12-20: qty 5

## 2010-12-20 MED ORDER — METOPROLOL TARTRATE 25 MG PO TABS
100.0000 mg | ORAL_TABLET | Freq: Once | ORAL | Status: AC
  Administered 2010-12-20: 100 mg via ORAL
  Filled 2010-12-20: qty 4

## 2010-12-20 MED ORDER — NITROGLYCERIN 0.4 MG SL SUBL
SUBLINGUAL_TABLET | SUBLINGUAL | Status: AC
  Filled 2010-12-20: qty 25

## 2010-12-20 MED ORDER — SODIUM CHLORIDE 0.9 % IV BOLUS (SEPSIS)
500.0000 mL | Freq: Once | INTRAVENOUS | Status: AC
  Administered 2010-12-20: 500 mL via INTRAVENOUS

## 2010-12-20 MED ORDER — ONDANSETRON HCL 4 MG/2ML IJ SOLN
4.0000 mg | Freq: Once | INTRAMUSCULAR | Status: AC
  Administered 2010-12-20: 4 mg via INTRAVENOUS
  Filled 2010-12-20: qty 2

## 2010-12-20 MED ORDER — MORPHINE SULFATE 4 MG/ML IJ SOLN
4.0000 mg | Freq: Once | INTRAMUSCULAR | Status: AC
  Administered 2010-12-20: 4 mg via INTRAVENOUS
  Filled 2010-12-20: qty 1

## 2010-12-20 MED ORDER — NITROGLYCERIN 0.4 MG SL SUBL
0.4000 mg | SUBLINGUAL_TABLET | Freq: Once | SUBLINGUAL | Status: AC
  Administered 2010-12-20: 14:00:00 via SUBLINGUAL

## 2010-12-20 NOTE — ED Provider Notes (Signed)
Medical screening examination/treatment/procedure(s) were conducted as a shared visit with non-physician practitioner(s) and myself.  I personally evaluated the patient during the encounter   Joya Gaskins, MD 12/20/10 [phone]

## 2010-12-20 NOTE — ED Notes (Signed)
Pt states that his pain is better  Since morphine

## 2010-12-20 NOTE — ED Notes (Signed)
Was lifting boxes  This am and started to have chest pain and had some sob started to cough and vomit has 20 left ac givwen 4 baby asa and 2 nitro  sl

## 2010-12-20 NOTE — ED Notes (Signed)
Pt tolerated walking to bathroom poorly with tachypnea and decreased spo2. PA aware, order given.

## 2010-12-20 NOTE — ED Provider Notes (Signed)
Pt seen with PA Hunt Pt feels improved Awake/alert Lung sounds clear on my exam EKG reviewed  Family history - CAD  Joya Gaskins, MD 12/20/10 1016

## 2010-12-20 NOTE — ED Notes (Signed)
Patient transported to CT 

## 2010-12-20 NOTE — ED Provider Notes (Signed)
Medical screening examination/treatment/procedure(s) were conducted as a shared visit with non-physician practitioner(s) and myself.  I personally evaluated the patient during the encounter   Taraneh Metheney W Hetty Linhart, MD 12/20/10 1657 

## 2010-12-20 NOTE — ED Provider Notes (Signed)
History     CSN: [id number] Arrival date & time: 12/20/2010  9:19 AM   First MD Initiated Contact with Patient 12/20/10 [phone]      Chief Complaint  Patient presents with  . Chest Pain    (Consider location/radiation/quality/duration/timing/severity/associated sxs/prior treatment) HPI  Presents to emergency department complaining of acute onset left-sided chest pain stating that he woke from sleep at 8:30 this morning with left-sided chest pain with pain radiating into his left arm with associated shortness of breath and nausea with one episode of vomiting. Patient is brought to ER by EMS and given 2 nitroglycerin sublingual in route and 4 baby aspirin and states since given medication the pain has significantly decreased to a dull discomfort rating pain at 3/10. Patient has history of sarcoid as well as Hodgkin's lymphoma but is in remission. Patient is not currently taking any medication is not being followed by primary care physician or any specialists. Patient was admitted to hospital in July 2010 for CP but after cardiac consultation, ECHO and EKG then cardiologist did not feel like origin of chest pain was worrisome for ACS or CAD.   Past Medical History  Diagnosis Date  . History of Hodgkin's disease   . ED (erectile dysfunction)   . Sarcoid   . Hypertension     Past Surgical History  Procedure Date  . Lung biopsy     No family history on file.  History  Substance Use Topics  . Smoking status: Current Everyday Smoker  . Smokeless tobacco: Not on file  . Alcohol Use: Yes      Review of Systems  All other systems reviewed and are negative.    Allergies  Review of patient's allergies indicates no known allergies.  Home Medications  No current outpatient prescriptions on file.  BP 110/71  Pulse 59  Temp(Src) 98.1 F (36.7 C) (Oral)  Resp 17  SpO2 97%  Physical Exam  Nursing note and vitals reviewed. Constitutional: He is oriented to person, place, and  time. He appears well-developed and well-nourished. No distress.  HENT:  Head: Normocephalic and atraumatic.  Eyes: Conjunctivae and EOM are normal. Pupils are equal, round, and reactive to light.  Neck: Normal range of motion. Neck supple.  Cardiovascular: Normal rate, regular rhythm, normal heart sounds and intact distal pulses.  Exam reveals no gallop and no friction rub.   No murmur heard. Pulmonary/Chest: Effort normal and breath sounds normal. No respiratory distress. He has no wheezes. He has no rales. He exhibits no tenderness.  Abdominal: Bowel sounds are normal. He exhibits no distension and no mass. There is no tenderness. There is no rebound and no guarding.  Musculoskeletal: Normal range of motion. He exhibits no edema and no tenderness.  Neurological: He is alert and oriented to person, place, and time.  Skin: Skin is warm and dry. No rash noted. He is not diaphoretic. No erythema.  Psychiatric: He has a normal mood and affect.    ED Course  Procedures (including critical care time)  IV morphine and zofran   Date: 12/20/2010  Rate: 57  Rhythm: normal sinus rhythm  QRS Axis: normal  Intervals: normal  ST/T Wave abnormalities: normal  Conduction Disutrbances:none  Narrative Interpretation: question mild LVH  Old EKG Reviewed: no significant changes from Dec 03, 2009  Family hx of CAD, HTN and DMII  Patient is agreeable to CDU CP protocol for rule out. I spoke with Dr. Reche Dixon who states Coronary CT would be appropriate for patient.  Report given to [facility] in CDU to follow for protocol. VSS.   Labs Reviewed  CBC - Abnormal; Notable for the following:    RBC 4.19 (*)    Hemoglobin 12.9 (*)    HCT 38.0 (*)    All other components within normal limits  BASIC METABOLIC PANEL - Abnormal; Notable for the following:    GFR calc non Af Amer 85 (*)    All other components within normal limits  DIFFERENTIAL  CARDIAC PANEL(CRET KIN+CKTOT+MB+TROPI)   Dg Chest 2  View  12/20/2010  *RADIOLOGY REPORT*  Clinical Data: Chest pain.  CHEST - 2 VIEW  Comparison: 08/11/2010.  Findings: Trachea is midline.  Heart size normal.  There are postoperative changes in the left hemithorax, stable.  Right lung is clear.  No pleural fluid.  IMPRESSION: Postoperative changes in the left hemithorax.  No acute findings.  Original Report Authenticated By: Reyes Ivan, M.D.     No diagnosis found.    MDM          Jenness Corner, PA 12/20/10 [phone]

## 2010-12-20 NOTE — ED Provider Notes (Signed)
L sided chest pain started this morning. Cough. History of hodgkins. No chest pain on re-evaluation. RRR. No pedal edema. Current smoker. Patient placed on chest pain protocol, coronary CT ordered.   3:37 PM Dr. Reche Dixon who recommended the patient be started on a statin medication and follow up with cardiology.   IMPRESSION:  1. Age advanced, but nonobstructive coronary artery disease. The  patient's total coronary artery calcium score is 15.1, which is  91st percentile for patient's matched age and gender. Calcification  all within a calcified plaque within the mid RDA. This causes less  than 10% stenosis.  2. Extracardiac findings significant only for scarring and  surgical changes in the left lower lobe.  3. Right-sided coronary artery dominance.  4. Suspicion of a patent foraminal ovale. Echocardiography could  confirm.  3:38 PM patient care taken over by tiffany greene, pa-c  Demetrius Charity, PA 12/20/10 1538

## 2010-12-28 NOTE — Progress Notes (Signed)
Observation review completed. 

## 2011-02-01 ENCOUNTER — Encounter: Payer: Self-pay | Admitting: Cardiology

## 2011-02-01 ENCOUNTER — Ambulatory Visit (INDEPENDENT_AMBULATORY_CARE_PROVIDER_SITE_OTHER): Payer: Self-pay | Admitting: Cardiology

## 2011-02-01 DIAGNOSIS — E7800 Pure hypercholesterolemia, unspecified: Secondary | ICD-10-CM

## 2011-02-01 DIAGNOSIS — Z720 Tobacco use: Secondary | ICD-10-CM | POA: Insufficient documentation

## 2011-02-01 DIAGNOSIS — Z79899 Other long term (current) drug therapy: Secondary | ICD-10-CM

## 2011-02-01 DIAGNOSIS — R0600 Dyspnea, unspecified: Secondary | ICD-10-CM | POA: Insufficient documentation

## 2011-02-01 DIAGNOSIS — R0609 Other forms of dyspnea: Secondary | ICD-10-CM

## 2011-02-01 MED ORDER — PRAVASTATIN SODIUM 40 MG PO TABS: 40.0000 mg | ORAL_TABLET | Freq: Every evening | ORAL | Status: DC

## 2011-02-01 NOTE — Patient Instructions (Signed)
Your physician wants you to follow-up in: ONE YEAR You will receive a reminder letter in the mail two months in advance. If you don't receive a letter, please call our office to schedule the follow-up appointment.   START ASPIRIN 81 MG ONCE DAILY WITH FOOD  START PRAVASTATIN 40 MG ONCE DAILY AT BEDTIME  Your physician recommends that you return for lab work in: 6 WEEKS=FASTING  Your physician has requested that you have an echocardiogram. Echocardiography is a painless test that uses sound waves to create images of your heart. It provides your doctor with information about the size and shape of your heart and how well your heart's chambers and valves are working. This procedure takes approximately one hour. There are no restrictions for this procedure.

## 2011-02-01 NOTE — Assessment & Plan Note (Signed)
Given history of sarcoid will repeat echocardiogram to quantify LV function.

## 2011-02-01 NOTE — Assessment & Plan Note (Signed)
Patient encouraged to followup with primary care.

## 2011-02-01 NOTE — Progress Notes (Signed)
   HPI: 41 year old male for evaluation of chest pain. Previously seen by Dr. Antoine Poche. Myoview in December of 2009 showed an ejection fraction of 47% and no ischemia. Echocardiogram in 2010 showed ejection fraction 50%, and grade 1 diastolic dysfunction, mild mitral regurgitation. Patient ruled out at that time. CT angiogram showed no pulmonary embolus.  Seen in the emergency room in November of 2012 for chest pain. Patient ruled out. Cardiac CT showed a calcium score of 15.1, but no obstructive coronary disease (there was a 10% lesion in the right coronary artery). Patient does describe dyspnea on exertion but no orthopnea, PND, pedal edema or syncope. He has had intermittent chest pain since being diagnosed with Hodgkin's in 2001. This is not clearly exertional but can occur at any time. Symptoms unchanged.  Current Outpatient Prescriptions  Medication Sig Dispense Refill  . aspirin EC 81 MG tablet Take 1 tablet (81 mg total) by mouth daily.  150 tablet  2  . pravastatin (PRAVACHOL) 40 MG tablet Take 1 tablet (40 mg total) by mouth every evening.  30 tablet  11     Past Medical History  Diagnosis Date  . History of Hodgkin's disease   . ED (erectile dysfunction)   . Sarcoid   . Hypertension   . Diabetes mellitus   . Hyperlipidemia     Past Surgical History  Procedure Date  . Lung biopsy     History   Social History  . Marital Status: Married    Spouse Name: N/A    Number of Children: N/A  . Years of Education: N/A   Occupational History  . Not on file.   Social History Main Topics  . Smoking status: Current Everyday Smoker  . Smokeless tobacco: Not on file  . Alcohol Use: Yes  . Drug Use: Yes    Special: Marijuana  . Sexually Active:    Other Topics Concern  . Not on file   Social History Narrative  . No narrative on file    ROS: no fevers or chills, productive cough, hemoptysis, dysphasia, odynophagia, melena, hematochezia, dysuria, hematuria, rash, seizure  activity, orthopnea, PND, pedal edema, claudication. Remaining systems are negative.  Physical Exam: Well-developed well-nourished in no acute distress.  Skin is warm and dry.  HEENT is normal.  Neck is supple. No thyromegaly.  Chest is clear to auscultation with normal expansion.  Cardiovascular exam is regular rate and rhythm.  Abdominal exam nontender or distended. No masses palpated. Extremities show no edema. neuro grossly intact  ECG sinus rhythm, left ventricular hypertrophy.

## 2011-02-01 NOTE — Assessment & Plan Note (Signed)
Patient counseled on discontinuing. 

## 2011-02-01 NOTE — Assessment & Plan Note (Signed)
Symptoms atypical and chronic. Cardiac CT shows a mildly elevated calcium score and mild plaque. Add enteric-coated aspirin 81 mg daily and Pravachol 40 mg daily. Check lipids and liver in 6 weeks.

## 2011-02-08 ENCOUNTER — Telehealth: Payer: Self-pay | Admitting: Cardiology

## 2011-02-08 ENCOUNTER — Ambulatory Visit (HOSPITAL_COMMUNITY): Payer: Self-pay | Attending: Cardiology | Admitting: Radiology

## 2011-02-08 DIAGNOSIS — F17200 Nicotine dependence, unspecified, uncomplicated: Secondary | ICD-10-CM | POA: Insufficient documentation

## 2011-02-08 DIAGNOSIS — E785 Hyperlipidemia, unspecified: Secondary | ICD-10-CM | POA: Insufficient documentation

## 2011-02-08 DIAGNOSIS — R0609 Other forms of dyspnea: Secondary | ICD-10-CM | POA: Insufficient documentation

## 2011-02-08 DIAGNOSIS — C8190 Hodgkin lymphoma, unspecified, unspecified site: Secondary | ICD-10-CM | POA: Insufficient documentation

## 2011-02-08 DIAGNOSIS — R0600 Dyspnea, unspecified: Secondary | ICD-10-CM

## 2011-02-08 DIAGNOSIS — D869 Sarcoidosis, unspecified: Secondary | ICD-10-CM | POA: Insufficient documentation

## 2011-02-08 DIAGNOSIS — R0989 Other specified symptoms and signs involving the circulatory and respiratory systems: Secondary | ICD-10-CM | POA: Insufficient documentation

## 2011-02-08 DIAGNOSIS — E119 Type 2 diabetes mellitus without complications: Secondary | ICD-10-CM | POA: Insufficient documentation

## 2011-02-08 DIAGNOSIS — I10 Essential (primary) hypertension: Secondary | ICD-10-CM | POA: Insufficient documentation

## 2011-02-08 NOTE — Telephone Encounter (Signed)
Spoke with pt, aware of echo results. 

## 2011-02-08 NOTE — Telephone Encounter (Signed)
Fu call °Patient returning your call °

## 2011-03-12 ENCOUNTER — Ambulatory Visit: Payer: Self-pay | Admitting: *Deleted

## 2011-03-12 ENCOUNTER — Other Ambulatory Visit: Payer: Self-pay

## 2011-03-12 ENCOUNTER — Other Ambulatory Visit: Payer: Self-pay | Admitting: *Deleted

## 2011-03-12 DIAGNOSIS — E7800 Pure hypercholesterolemia, unspecified: Secondary | ICD-10-CM

## 2011-03-12 DIAGNOSIS — Z79899 Other long term (current) drug therapy: Secondary | ICD-10-CM

## 2011-03-12 LAB — HEPATIC FUNCTION PANEL
ALT: 10 U/L (ref 0–53)
AST: 17 U/L (ref 0–37)
Albumin: 4.3 g/dL (ref 3.5–5.2)
Total Bilirubin: 0.8 mg/dL (ref 0.3–1.2)

## 2011-03-12 LAB — LIPID PANEL
Cholesterol: 174 mg/dL (ref 0–200)
Triglycerides: 79 mg/dL (ref 0.0–149.0)

## 2011-03-13 ENCOUNTER — Telehealth: Payer: Self-pay | Admitting: *Deleted

## 2011-03-13 DIAGNOSIS — E7800 Pure hypercholesterolemia, unspecified: Secondary | ICD-10-CM

## 2011-03-13 DIAGNOSIS — Z79899 Other long term (current) drug therapy: Secondary | ICD-10-CM

## 2011-03-13 MED ORDER — PRAVASTATIN SODIUM 80 MG PO TABS: 80.0000 mg | ORAL_TABLET | Freq: Every evening | ORAL | Status: DC

## 2011-03-13 NOTE — Telephone Encounter (Signed)
Message copied by Freddi Starr on Tue Mar 13, 2011  2:02 PM ------      Message from: Lewayne Bunting      Created: Mon Mar 12, 2011  7:04 PM       Change pravachol to 80 mg po daily, lipids and liver in six weeks      Olga Millers

## 2011-04-11 ENCOUNTER — Other Ambulatory Visit: Payer: Self-pay

## 2011-10-10 ENCOUNTER — Encounter (HOSPITAL_COMMUNITY): Payer: Self-pay | Admitting: Family Medicine

## 2011-10-10 ENCOUNTER — Emergency Department (HOSPITAL_COMMUNITY)
Admission: EM | Admit: 2011-10-10 | Discharge: 2011-10-10 | Disposition: A | Discharge status: Home / Self Care | Payer: Medicaid Other | Attending: Emergency Medicine | Admitting: Emergency Medicine

## 2011-10-10 DIAGNOSIS — Z113 Encounter for screening for infections with a predominantly sexual mode of transmission: Secondary | ICD-10-CM | POA: Insufficient documentation

## 2011-10-10 DIAGNOSIS — N342 Other urethritis: Secondary | ICD-10-CM | POA: Insufficient documentation

## 2011-10-10 DIAGNOSIS — D869 Sarcoidosis, unspecified: Secondary | ICD-10-CM | POA: Insufficient documentation

## 2011-10-10 DIAGNOSIS — E785 Hyperlipidemia, unspecified: Secondary | ICD-10-CM | POA: Insufficient documentation

## 2011-10-10 DIAGNOSIS — F17200 Nicotine dependence, unspecified, uncomplicated: Secondary | ICD-10-CM | POA: Insufficient documentation

## 2011-10-10 DIAGNOSIS — I10 Essential (primary) hypertension: Secondary | ICD-10-CM | POA: Insufficient documentation

## 2011-10-10 DIAGNOSIS — E119 Type 2 diabetes mellitus without complications: Secondary | ICD-10-CM | POA: Insufficient documentation

## 2011-10-10 DIAGNOSIS — Z8571 Personal history of Hodgkin lymphoma: Secondary | ICD-10-CM | POA: Insufficient documentation

## 2011-10-10 MED ORDER — AZITHROMYCIN 250 MG PO TABS
500.0000 mg | ORAL_TABLET | Freq: Once | ORAL | Status: AC
  Administered 2011-10-10: 500 mg via ORAL
  Filled 2011-10-10: qty 2

## 2011-10-10 MED ORDER — LIDOCAINE HCL (PF) 1 % IJ SOLN
INTRAMUSCULAR | Status: AC
  Administered 2011-10-10: 5 mL
  Filled 2011-10-10: qty 5

## 2011-10-10 MED ORDER — CEFTRIAXONE SODIUM 250 MG IJ SOLR
250.0000 mg | Freq: Once | INTRAMUSCULAR | Status: AC
  Administered 2011-10-10: 250 mg via INTRAMUSCULAR
  Filled 2011-10-10: qty 250
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
## 2011-10-10 NOTE — ED Notes (Signed)
Per pt sts he went to an [place] party last week and now he is having penile discharge.

## 2011-10-11 LAB — GC/CHLAMYDIA PROBE AMP, GENITAL: Chlamydia, DNA Probe: NEGATIVE

## 2011-11-17 ENCOUNTER — Other Ambulatory Visit (HOSPITAL_COMMUNITY): Payer: Self-pay

## 2011-11-17 ENCOUNTER — Emergency Department (HOSPITAL_COMMUNITY)
Admission: EM | Admit: 2011-11-17 | Discharge: 2011-11-17 | Disposition: A | Discharge status: Home / Self Care | Payer: Medicare Other | Attending: Emergency Medicine | Admitting: Emergency Medicine

## 2011-11-17 ENCOUNTER — Encounter (HOSPITAL_COMMUNITY): Payer: Self-pay | Admitting: Emergency Medicine

## 2011-11-17 ENCOUNTER — Emergency Department (HOSPITAL_COMMUNITY): Payer: Medicare Other

## 2011-11-17 DIAGNOSIS — R109 Unspecified abdominal pain: Secondary | ICD-10-CM

## 2011-11-17 DIAGNOSIS — Z8719 Personal history of other diseases of the digestive system: Secondary | ICD-10-CM | POA: Insufficient documentation

## 2011-11-17 DIAGNOSIS — I10 Essential (primary) hypertension: Secondary | ICD-10-CM | POA: Insufficient documentation

## 2011-11-17 DIAGNOSIS — Z8571 Personal history of Hodgkin lymphoma: Secondary | ICD-10-CM | POA: Insufficient documentation

## 2011-11-17 DIAGNOSIS — D869 Sarcoidosis, unspecified: Secondary | ICD-10-CM | POA: Insufficient documentation

## 2011-11-17 DIAGNOSIS — E785 Hyperlipidemia, unspecified: Secondary | ICD-10-CM | POA: Insufficient documentation

## 2011-11-17 DIAGNOSIS — Z87898 Personal history of other specified conditions: Secondary | ICD-10-CM | POA: Insufficient documentation

## 2011-11-17 DIAGNOSIS — M549 Dorsalgia, unspecified: Secondary | ICD-10-CM | POA: Insufficient documentation

## 2011-11-17 DIAGNOSIS — E119 Type 2 diabetes mellitus without complications: Secondary | ICD-10-CM | POA: Insufficient documentation

## 2011-11-17 DIAGNOSIS — F17200 Nicotine dependence, unspecified, uncomplicated: Secondary | ICD-10-CM | POA: Insufficient documentation

## 2011-11-17 DIAGNOSIS — R61 Generalized hyperhidrosis: Secondary | ICD-10-CM | POA: Insufficient documentation

## 2011-11-17 LAB — URINALYSIS, ROUTINE W REFLEX MICROSCOPIC
Hgb urine dipstick: NEGATIVE
Ketones, ur: 15 mg/dL — AB
Nitrite: NEGATIVE
pH: 5.5 (ref 5.0–8.0)

## 2011-11-17 LAB — COMPREHENSIVE METABOLIC PANEL
Albumin: 4.7 g/dL (ref 3.5–5.2)
Alkaline Phosphatase: 99 U/L (ref 39–117)
BUN: 22 mg/dL (ref 6–23)
CO2: 26 mEq/L (ref 19–32)
Chloride: 103 mEq/L (ref 96–112)
Creatinine, Ser: 1.19 mg/dL (ref 0.50–1.35)
GFR calc non Af Amer: 76 mL/min — ABNORMAL LOW (ref >90)
Glucose, Bld: 82 mg/dL (ref 70–99)
Potassium: 4.5 mEq/L (ref 3.5–5.1)
Total Bilirubin: 0.7 mg/dL (ref 0.3–1.2)

## 2011-11-17 LAB — CBC
HCT: 47 % (ref 39.0–52.0)
Hemoglobin: 16.6 g/dL (ref 13.0–17.0)
MCV: 89 fL (ref 78.0–100.0)
RBC: 5.28 MIL/uL (ref 4.22–5.81)
RDW: 13 % (ref 11.5–15.5)
WBC: 5.4 10*3/uL (ref 4.0–10.5)

## 2011-11-17 LAB — LIPASE, BLOOD: Lipase: 53 U/L (ref 11–59)

## 2011-11-17 MED ORDER — HYDROMORPHONE HCL PF 1 MG/ML IJ SOLN
1.0000 mg | Freq: Once | INTRAMUSCULAR | Status: AC
  Administered 2011-11-17: 1 mg via INTRAVENOUS
  Filled 2011-11-17: qty 1

## 2011-11-17 MED ORDER — IOHEXOL 300 MG/ML  SOLN
100.0000 mL | Freq: Once | INTRAMUSCULAR | Status: AC | PRN
  Administered 2011-11-17: 100 mL via INTRAVENOUS

## 2011-11-17 MED ORDER — ONDANSETRON HCL 4 MG/2ML IJ SOLN
4.0000 mg | Freq: Once | INTRAMUSCULAR | Status: AC
  Administered 2011-11-17: 4 mg via INTRAVENOUS
  Filled 2011-11-17: qty 2

## 2011-11-17 MED ORDER — IOHEXOL 300 MG/ML  SOLN: 20.0000 mL | INTRAMUSCULAR | Status: AC

## 2011-11-17 MED ORDER — SODIUM CHLORIDE 0.9 % IV BOLUS (SEPSIS)
1000.0000 mL | Freq: Once | INTRAVENOUS | Status: AC
  Administered 2011-11-17: 1000 mL via INTRAVENOUS

## 2011-11-17 NOTE — ED Notes (Signed)
Pt. Stated, I've been having night sweats for a week.  Also having dark urine, having chills and pain in my back and left side .

## 2011-11-17 NOTE — ED Notes (Signed)
Pt st's he has been having night sweats for approx 1 week.  Pt st's this am when he urinated he noticed a discharge from his penis.  St's he is married and doesn't think he has a STD.  Also c/o his urine being dark with bad odor.  Also c/o LLQ pain radiating into left flank.

## 2011-11-17 NOTE — ED Provider Notes (Signed)
History     CSN: [id number]  Arrival date & time 11/17/11  1422   First MD Initiated Contact with Patient 11/17/11 1534      Chief Complaint  Patient presents with  . Abdominal Pain    (Consider location/radiation/quality/duration/timing/severity/associated sxs/prior treatment) HPI Pt presents with c/o night sweats over the past 2 weeks with left sided abdominal pain and left sided back pain.  States today his urine looked dark brown.  He denies urinary frequency or urgency.  Did have emesis x 3 last night.  No diarrhea.  He states he has a hx os sarcoid, hodgkin's lymphoma and splenic infarct.  hodgkins is in remission- last checked by Dr. Myna Hidalgo x 2 years ago.  There are no other associated systemic symptoms, there are no other alleviating or modifying factors.   Past Medical History  Diagnosis Date  . History of Hodgkin's disease   . ED (erectile dysfunction)   . Sarcoid   . Hypertension   . Diabetes mellitus   . Hyperlipidemia     Past Surgical History  Procedure Date  . Lung biopsy     No family history on file.  History  Substance Use Topics  . Smoking status: Current Every Day Smoker  . Smokeless tobacco: Not on file  . Alcohol Use: Yes      Review of Systems ROS reviewed and all otherwise negative except for mentioned in HPI  Allergies  Review of patient's allergies indicates no known allergies.  Home Medications   No current outpatient prescriptions on file.  BP 111/92  Pulse 100  Temp 98.3 F (36.8 C) (Oral)  Resp 22  SpO2 98% Vitals reviewed Physical Exam Physical Examination: General appearance - alert, well appearing, and in no distress Mental status - alert, oriented to person, place, and time Eyes - pupils equal and reactive, mild scleral icterus, no conjunctival injection Mouth - mucous membranes moist, pharynx normal without lesions Chest - clear to auscultation, no wheezes, rales or rhonchi, symmetric air entry Heart - normal  rate, regular rhythm, normal S1, S2, no murmurs, rubs, clicks or gallops Abdomen - soft, ttp in left lower abdomen as well as epigastric region, nabs, nondistended, no masses or organomegaly Back- left CVA tenderness, no midline tenderness to palpation Extremities - peripheral pulses normal, no pedal edema, no clubbing or cyanosis Skin - normal coloration and turgor, no rashes Psych- anxious, cooperative  ED Course  Procedures (including critical care time)  Labs Reviewed  URINALYSIS, ROUTINE W REFLEX MICROSCOPIC - Abnormal; Notable for the following:    Color, Urine AMBER (*)  BIOCHEMICALS MAY BE AFFECTED BY COLOR   APPearance CLOUDY (*)     Bilirubin Urine SMALL (*)     Ketones, ur 15 (*)     All other components within normal limits  COMPREHENSIVE METABOLIC PANEL - Abnormal; Notable for the following:    Total Protein 8.6 (*)     GFR calc non Af Amer 76 (*)     GFR calc Af Amer 88 (*)     All other components within normal limits  CBC  LIPASE, BLOOD   Ct Abdomen Pelvis W Contrast  11/17/2011  *RADIOLOGY REPORT*  Clinical Data: 41 year old male with abdominal and pelvic pain and night sweats.  CT ABDOMEN AND PELVIS WITH CONTRAST  Technique:  Multidetector CT imaging of the abdomen and pelvis was performed following the standard protocol during bolus administration of intravenous contrast.  Contrast: OMNIPAQUE IOHEXOL 300 MG/ML  SOLN  Comparison:  08/10/2010 and prior CTs.  Findings: A small partially calcified peripheral nodular density within the posterior left lower lobe is unchanged.  The liver, spleen, adrenal glands, pancreas, gallbladder and kidneys are unremarkable except for probable small right renal cysts.  No free fluid, enlarged lymph nodes, biliary dilation or abdominal aortic aneurysm identified.  A mildly prominent left common iliac lymph node and surgical clips in the left inguinal region with adjacent soft tissue are unchanged from 2009.  The bowel is within normal  limits. The bladder is unremarkable.  No acute or suspicious bony abnormalities are identified.  IMPRESSION: No evidence of acute abnormality.   Original Report Authenticated By: Rosendo Gros, M.D.      1. Abdominal pain   2. Back pain       MDM  Pt presents with c/o left sided abdominal and back pain with night sweats and dark urine.  Labs as well as CT abdomen are reassuring.  Pt received IV hydration in ED, may be a bit dehydrated.  No sign of significant infection or other emergent condition that requires further treatment/evaluation at this time.  Discharged with strict return precautions.  Pt agreeable with plan.        Ethelda Chick, MD 11/17/11 [phone]

## 2011-11-17 NOTE — ED Notes (Signed)
Pt. States, I'm having a private discharge too.

## 2012-01-15 ENCOUNTER — Emergency Department (HOSPITAL_COMMUNITY): Payer: Medicare Other

## 2012-01-15 ENCOUNTER — Encounter (HOSPITAL_COMMUNITY): Payer: Self-pay

## 2012-01-15 ENCOUNTER — Emergency Department (HOSPITAL_COMMUNITY)
Admission: EM | Admit: 2012-01-15 | Discharge: 2012-01-15 | Disposition: A | Discharge status: Home / Self Care | Payer: Medicare Other | Attending: Emergency Medicine | Admitting: Emergency Medicine

## 2012-01-15 DIAGNOSIS — E785 Hyperlipidemia, unspecified: Secondary | ICD-10-CM | POA: Insufficient documentation

## 2012-01-15 DIAGNOSIS — Z8619 Personal history of other infectious and parasitic diseases: Secondary | ICD-10-CM | POA: Insufficient documentation

## 2012-01-15 DIAGNOSIS — F17200 Nicotine dependence, unspecified, uncomplicated: Secondary | ICD-10-CM | POA: Insufficient documentation

## 2012-01-15 DIAGNOSIS — R5381 Other malaise: Secondary | ICD-10-CM | POA: Insufficient documentation

## 2012-01-15 DIAGNOSIS — R319 Hematuria, unspecified: Secondary | ICD-10-CM | POA: Insufficient documentation

## 2012-01-15 DIAGNOSIS — M71129 Other infective bursitis, unspecified elbow: Secondary | ICD-10-CM

## 2012-01-15 DIAGNOSIS — R300 Dysuria: Secondary | ICD-10-CM | POA: Insufficient documentation

## 2012-01-15 DIAGNOSIS — M7020 Olecranon bursitis, unspecified elbow: Secondary | ICD-10-CM | POA: Insufficient documentation

## 2012-01-15 DIAGNOSIS — R509 Fever, unspecified: Secondary | ICD-10-CM | POA: Insufficient documentation

## 2012-01-15 DIAGNOSIS — R369 Urethral discharge, unspecified: Secondary | ICD-10-CM

## 2012-01-15 DIAGNOSIS — I10 Essential (primary) hypertension: Secondary | ICD-10-CM | POA: Insufficient documentation

## 2012-01-15 DIAGNOSIS — C8190 Hodgkin lymphoma, unspecified, unspecified site: Secondary | ICD-10-CM | POA: Insufficient documentation

## 2012-01-15 DIAGNOSIS — N2889 Other specified disorders of kidney and ureter: Secondary | ICD-10-CM

## 2012-01-15 DIAGNOSIS — R5383 Other fatigue: Secondary | ICD-10-CM | POA: Insufficient documentation

## 2012-01-15 LAB — CBC WITH DIFFERENTIAL/PLATELET
Basophils Absolute: 0 10*3/uL (ref 0.0–0.1)
Basophils Relative: 0 % (ref 0–1)
Eosinophils Absolute: 0.1 10*3/uL (ref 0.0–0.7)
Eosinophils Relative: 1 % (ref 0–5)
MCH: 31.5 pg (ref 26.0–34.0)
MCV: 89.7 fL (ref 78.0–100.0)
Neutrophils Relative %: 67 % (ref 43–77)
Platelets: 184 10*3/uL (ref 150–400)
RDW: 13.1 % (ref 11.5–15.5)
WBC: 11.2 10*3/uL — ABNORMAL HIGH (ref 4.0–10.5)

## 2012-01-15 LAB — URINALYSIS, ROUTINE W REFLEX MICROSCOPIC
Bilirubin Urine: NEGATIVE
Ketones, ur: NEGATIVE mg/dL
Nitrite: NEGATIVE
Protein, ur: NEGATIVE mg/dL
pH: 6 (ref 5.0–8.0)

## 2012-01-15 LAB — BASIC METABOLIC PANEL
Calcium: 9.5 mg/dL (ref 8.4–10.5)
GFR calc Af Amer: 75 mL/min — ABNORMAL LOW (ref >90)
GFR calc non Af Amer: 64 mL/min — ABNORMAL LOW (ref >90)
Sodium: 139 mEq/L (ref 135–145)

## 2012-01-15 MED ORDER — AZITHROMYCIN 250 MG PO TABS
1000.0000 mg | ORAL_TABLET | Freq: Once | ORAL | Status: AC
  Administered 2012-01-15: 1000 mg via ORAL
  Filled 2012-01-15: qty 4

## 2012-01-15 MED ORDER — ONDANSETRON 8 MG PO TBDP
8.0000 mg | ORAL_TABLET | Freq: Once | ORAL | Status: AC
  Administered 2012-01-15: 8 mg via ORAL
  Filled 2012-01-15: qty 1

## 2012-01-15 MED ORDER — CEFTRIAXONE SODIUM 1 G IJ SOLR
1.0000 g | Freq: Once | INTRAMUSCULAR | Status: AC
  Administered 2012-01-15: 1 g via INTRAMUSCULAR
  Filled 2012-01-15: qty 10

## 2012-01-15 MED ORDER — DOXYCYCLINE HYCLATE 100 MG PO CAPS: 100.0000 mg | ORAL_CAPSULE | Freq: Two times a day (BID) | ORAL | Status: DC

## 2012-01-15 MED ORDER — CEPHALEXIN 500 MG PO CAPS: 500.0000 mg | ORAL_CAPSULE | Freq: Four times a day (QID) | ORAL | Status: DC

## 2012-01-15 MED ORDER — METRONIDAZOLE 500 MG PO TABS
2000.0000 mg | ORAL_TABLET | Freq: Once | ORAL | Status: AC
  Administered 2012-01-15: 2000 mg via ORAL
  Filled 2012-01-15: qty 4

## 2012-01-15 NOTE — ED Notes (Addendum)
Patient reports that he noted an open wound to his left elbow and is drainaging blood-tinged drainage and also noted last night blood-tinged penile discharge. Patient reports "night sweats" Patient also c/o difficulty urinating, states stream is a trickle.
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
## 2012-01-15 NOTE — ED Provider Notes (Signed)
Medical screening examination/treatment/procedure(s) were performed by non-physician practitioner and as supervising physician I was immediately available for consultation/collaboration.    Celene Kras, MD 01/15/12 2239

## 2012-01-17 LAB — GC/CHLAMYDIA PROBE AMP
CT Probe RNA: NEGATIVE
GC Probe RNA: NEGATIVE

## 2012-01-18 LAB — WOUND CULTURE: Culture: NO GROWTH

## 2012-04-22 ENCOUNTER — Encounter (HOSPITAL_COMMUNITY): Payer: Self-pay | Admitting: Emergency Medicine

## 2012-04-22 ENCOUNTER — Emergency Department (HOSPITAL_COMMUNITY): Payer: Medicare Other

## 2012-04-22 ENCOUNTER — Emergency Department (HOSPITAL_COMMUNITY)
Admission: EM | Admit: 2012-04-22 | Discharge: 2012-04-22 | Discharge status: Against Medical Advice | Payer: Medicare Other | Attending: Emergency Medicine | Admitting: Emergency Medicine

## 2012-04-22 ENCOUNTER — Ambulatory Visit: Payer: Self-pay | Admitting: Family Medicine

## 2012-04-22 DIAGNOSIS — I10 Essential (primary) hypertension: Secondary | ICD-10-CM | POA: Insufficient documentation

## 2012-04-22 DIAGNOSIS — F17200 Nicotine dependence, unspecified, uncomplicated: Secondary | ICD-10-CM | POA: Insufficient documentation

## 2012-04-22 DIAGNOSIS — M79609 Pain in unspecified limb: Secondary | ICD-10-CM | POA: Insufficient documentation

## 2012-04-22 NOTE — ED Notes (Signed)
Pt with possible abscess to left elbow and possible movement of surgical rod in elbow; pt sts pain and swelling

## 2013-01-06 ENCOUNTER — Emergency Department (HOSPITAL_COMMUNITY)
Admission: EM | Admit: 2013-01-06 | Discharge: 2013-01-06 | Disposition: A | Discharge status: Home / Self Care | Payer: Medicare Other | Attending: Emergency Medicine | Admitting: Emergency Medicine

## 2013-01-06 ENCOUNTER — Emergency Department (HOSPITAL_COMMUNITY): Payer: Medicare Other

## 2013-01-06 ENCOUNTER — Encounter (HOSPITAL_COMMUNITY): Payer: Self-pay | Admitting: Emergency Medicine

## 2013-01-06 DIAGNOSIS — Z8739 Personal history of other diseases of the musculoskeletal system and connective tissue: Secondary | ICD-10-CM | POA: Insufficient documentation

## 2013-01-06 DIAGNOSIS — R110 Nausea: Secondary | ICD-10-CM | POA: Insufficient documentation

## 2013-01-06 DIAGNOSIS — M25529 Pain in unspecified elbow: Secondary | ICD-10-CM | POA: Insufficient documentation

## 2013-01-06 DIAGNOSIS — D869 Sarcoidosis, unspecified: Secondary | ICD-10-CM | POA: Insufficient documentation

## 2013-01-06 DIAGNOSIS — Z8571 Personal history of Hodgkin lymphoma: Secondary | ICD-10-CM | POA: Insufficient documentation

## 2013-01-06 DIAGNOSIS — I10 Essential (primary) hypertension: Secondary | ICD-10-CM | POA: Insufficient documentation

## 2013-01-06 DIAGNOSIS — R000 Tachycardia, unspecified: Secondary | ICD-10-CM | POA: Insufficient documentation

## 2013-01-06 DIAGNOSIS — R509 Fever, unspecified: Secondary | ICD-10-CM | POA: Insufficient documentation

## 2013-01-06 DIAGNOSIS — E785 Hyperlipidemia, unspecified: Secondary | ICD-10-CM | POA: Insufficient documentation

## 2013-01-06 DIAGNOSIS — M25522 Pain in left elbow: Secondary | ICD-10-CM

## 2013-01-06 DIAGNOSIS — F17200 Nicotine dependence, unspecified, uncomplicated: Secondary | ICD-10-CM | POA: Insufficient documentation

## 2013-01-06 DIAGNOSIS — N529 Male erectile dysfunction, unspecified: Secondary | ICD-10-CM | POA: Insufficient documentation

## 2013-01-06 DIAGNOSIS — B079 Viral wart, unspecified: Secondary | ICD-10-CM

## 2013-01-06 LAB — CBC WITH DIFFERENTIAL/PLATELET
Eosinophils Absolute: 0 10*3/uL (ref 0.0–0.7)
Lymphocytes Relative: 31 % (ref 12–46)
Lymphs Abs: 2.7 10*3/uL (ref 0.7–4.0)
MCH: 31.4 pg (ref 26.0–34.0)
Neutro Abs: 5.5 10*3/uL (ref 1.7–7.7)
Neutrophils Relative %: 63 % (ref 43–77)
Platelets: 163 10*3/uL (ref 150–400)
RBC: 4.62 MIL/uL (ref 4.22–5.81)
WBC: 8.8 10*3/uL (ref 4.0–10.5)

## 2013-01-06 LAB — BASIC METABOLIC PANEL
Chloride: 102 mEq/L (ref 96–112)
GFR calc non Af Amer: 72 mL/min — ABNORMAL LOW (ref >90)
Glucose, Bld: 76 mg/dL (ref 70–99)
Potassium: 4.1 mEq/L (ref 3.5–5.1)
Sodium: 139 mEq/L (ref 135–145)

## 2013-01-06 MED ORDER — IBUPROFEN 800 MG PO TABS: 800.0000 mg | ORAL_TABLET | Freq: Three times a day (TID) | ORAL | Status: DC

## 2013-01-06 MED ORDER — OXYCODONE-ACETAMINOPHEN 5-325 MG PO TABS
2.0000 | ORAL_TABLET | Freq: Once | ORAL | Status: AC
  Administered 2013-01-06: 2 via ORAL
  Filled 2013-01-06: qty 2

## 2013-01-06 MED ORDER — OXYCODONE-ACETAMINOPHEN 5-325 MG PO TABS: 1.0000 | ORAL_TABLET | ORAL | Status: DC | PRN
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
## 2013-01-06 NOTE — Progress Notes (Signed)
Orthopedic Tech Progress Note Patient Details:  [name] 1970-03-30 [id number]  Ortho Devices Type of Ortho Device: Arm sling Ortho Device/Splint Location: LUE Ortho Device/Splint Interventions: Ordered;Application   Jennye Moccasin 01/06/2013, 8:59 PM

## 2013-01-19 ENCOUNTER — Emergency Department (HOSPITAL_COMMUNITY)
Admission: EM | Admit: 2013-01-19 | Discharge: 2013-01-19 | Disposition: A | Discharge status: Home / Self Care | Payer: Medicare Other | Attending: Emergency Medicine | Admitting: Emergency Medicine

## 2013-01-19 ENCOUNTER — Encounter (HOSPITAL_COMMUNITY): Payer: Self-pay | Admitting: Emergency Medicine

## 2013-01-19 DIAGNOSIS — F17200 Nicotine dependence, unspecified, uncomplicated: Secondary | ICD-10-CM | POA: Insufficient documentation

## 2013-01-19 DIAGNOSIS — Z8571 Personal history of Hodgkin lymphoma: Secondary | ICD-10-CM | POA: Insufficient documentation

## 2013-01-19 DIAGNOSIS — Z202 Contact with and (suspected) exposure to infections with a predominantly sexual mode of transmission: Secondary | ICD-10-CM | POA: Insufficient documentation

## 2013-01-19 DIAGNOSIS — Z87448 Personal history of other diseases of urinary system: Secondary | ICD-10-CM | POA: Insufficient documentation

## 2013-01-19 DIAGNOSIS — R109 Unspecified abdominal pain: Secondary | ICD-10-CM | POA: Insufficient documentation

## 2013-01-19 DIAGNOSIS — Z8619 Personal history of other infectious and parasitic diseases: Secondary | ICD-10-CM | POA: Insufficient documentation

## 2013-01-19 DIAGNOSIS — I10 Essential (primary) hypertension: Secondary | ICD-10-CM | POA: Insufficient documentation

## 2013-01-19 DIAGNOSIS — Z862 Personal history of diseases of the blood and blood-forming organs and certain disorders involving the immune mechanism: Secondary | ICD-10-CM | POA: Insufficient documentation

## 2013-01-19 DIAGNOSIS — Z8639 Personal history of other endocrine, nutritional and metabolic disease: Secondary | ICD-10-CM | POA: Insufficient documentation

## 2013-01-19 DIAGNOSIS — Z711 Person with feared health complaint in whom no diagnosis is made: Secondary | ICD-10-CM

## 2013-01-19 MED ORDER — AZITHROMYCIN 250 MG PO TABS
1000.0000 mg | ORAL_TABLET | Freq: Once | ORAL | Status: AC
  Administered 2013-01-19: 1000 mg via ORAL
  Filled 2013-01-19: qty 4

## 2013-01-19 MED ORDER — CEFTRIAXONE SODIUM 250 MG IJ SOLR
250.0000 mg | Freq: Once | INTRAMUSCULAR | Status: AC
  Administered 2013-01-19: 250 mg via INTRAMUSCULAR
  Filled 2013-01-19: qty 250

## 2013-01-19 NOTE — ED Notes (Signed)
Penile discharge and groin pain.

## 2013-01-19 NOTE — ED Notes (Signed)
Pt presents today with c/o penile discharge and groin pain.  Pt also states he was seen a couple of weeks ago for his elbow and has a f/u appt with a specialist.

## 2013-01-19 NOTE — ED Provider Notes (Signed)
CSN: [id number]     Arrival date & time 01/19/13  1044 History   First MD Initiated Contact with Patient 01/19/13 1114    This chart was scribed for Clinton Sawyer, a non-physician practitioner working with Leonette Most B. Bernette Mayers, MD by Lewanda Rife, ED Scribe. This patient was seen in room TR11C/TR11C and the patient's care was started at 11:28 AM     Chief Complaint  Patient presents with  . Penile Discharge  . Groin Pain   (Consider location/radiation/quality/duration/timing/severity/associated sxs/prior Treatment) The history is provided by the patient and medical records. No language interpreter was used.   HPI Comments: ANKITH EDMONSTON is a 42 y.o. male who presents to the Emergency Department complaining of penile discharge onset "a few days". Describes discharge white. Reports associated groin pain. Reports new sexual partner 1 month ago. Denies any aggravating or alleviating factors. Denies associated fever, dysuria, testicular pain, testicular swelling, rash,  and penile pain. States he "wants to be treated."   Past Medical History  Diagnosis Date  . History of Hodgkin's disease   . ED (erectile dysfunction)   . Sarcoid   . Hypertension   . Hyperlipidemia    Past Surgical History  Procedure Laterality Date  . Lung biopsy     No family history on file. History  Substance Use Topics  . Smoking status: Current Every Day Smoker    Types: Cigarettes  . Smokeless tobacco: Never Used  . Alcohol Use: Yes     Comment: occasionally    Review of Systems  Constitutional: Negative for fever.  Genitourinary: Positive for discharge. Negative for testicular pain.  Psychiatric/Behavioral: Negative for confusion.   A complete 10 system review of systems was obtained and all systems are negative except as noted in the HPI and PMHx.    Allergies  Review of patient's allergies indicates no known allergies.  Home Medications   Current Outpatient Rx  Name  Route  Sig   Dispense  Refill  . ibuprofen (ADVIL,MOTRIN) 800 MG tablet   Oral   Take 1 tablet (800 mg total) by mouth 3 (three) times daily.   21 tablet   0   . oxyCODONE-acetaminophen (PERCOCET/ROXICET) 5-325 MG per tablet   Oral   Take 1 tablet by mouth every 4 (four) hours as needed.   20 tablet   0    BP 141/94  Pulse 94  Temp(Src) 98.1 F (36.7 C) (Oral)  Wt 145 lb (65.772 kg)  SpO2 97% Physical Exam  Nursing note and vitals reviewed. Constitutional: He is oriented to person, place, and time. He appears well-developed and well-nourished. No distress.  HENT:  Head: Normocephalic and atraumatic.  Eyes: EOM are normal.  Neck: Neck supple. No tracheal deviation present.  Cardiovascular: Normal rate.   Pulmonary/Chest: Effort normal. No respiratory distress.  Genitourinary: Testes normal and penis normal. Right testis shows no swelling and no tenderness. Left testis shows no swelling and no tenderness. No penile tenderness.  Chaperone was present during exam.   Musculoskeletal: Normal range of motion.  Lymphadenopathy:       Right: No inguinal adenopathy present.       Left: No inguinal adenopathy present.  Neurological: He is alert and oriented to person, place, and time.  Skin: Skin is warm and dry.  Psychiatric: He has a normal mood and affect. His behavior is normal.    ED Course  Procedures  COORDINATION OF CARE:  Nursing notes reviewed. Vital signs reviewed. Initial pt interview and  examination performed.   11:28 AM-Discussed work up plan with pt at bedside, which includes GC/Chlamydia probe. Pt agrees with plan. Pt offered other STD panel options, but pt refused.   Treatment plan initiated: Medications  cefTRIAXone (ROCEPHIN) injection 250 mg (not administered)  azithromycin (ZITHROMAX) tablet 1,000 mg (not administered)     Initial diagnostic testing ordered.    Labs Review Labs Reviewed  GC/CHLAMYDIA PROBE AMP   Imaging Review No results found.  EKG  Interpretation   None       MDM   1. Concern about STD in male without diagnosis    Rocephin and azithromycin given. Cultures pending. Patient refusing HIV or syphilis test. Return precautions given. Patient states understanding of treatment care plan and is agreeable.   I personally performed the services described in this documentation, which was scribed in my presence. The recorded information has been reviewed and is accurate.   Trevor Mace, PA-C 01/19/13 1128

## 2013-01-19 NOTE — ED Provider Notes (Signed)
Medical screening examination/treatment/procedure(s) were performed by non-physician practitioner and as supervising physician I was immediately available for consultation/collaboration.  EKG Interpretation   None         Charles B. Bernette Mayers, MD 01/19/13 [phone]

## 2013-01-20 LAB — GC/CHLAMYDIA PROBE AMP
CT Probe RNA: NEGATIVE
GC Probe RNA: NEGATIVE

## 2014-04-26 ENCOUNTER — Emergency Department (HOSPITAL_COMMUNITY): Payer: Medicare Other

## 2014-04-26 ENCOUNTER — Emergency Department (HOSPITAL_COMMUNITY)
Admission: EM | Admit: 2014-04-26 | Discharge: 2014-04-26 | Disposition: A | Discharge status: Home / Self Care | Payer: Medicare Other | Attending: Emergency Medicine | Admitting: Emergency Medicine

## 2014-04-26 ENCOUNTER — Encounter (HOSPITAL_COMMUNITY): Payer: Self-pay | Admitting: Emergency Medicine

## 2014-04-26 DIAGNOSIS — R109 Unspecified abdominal pain: Secondary | ICD-10-CM | POA: Diagnosis not present

## 2014-04-26 DIAGNOSIS — Z862 Personal history of diseases of the blood and blood-forming organs and certain disorders involving the immune mechanism: Secondary | ICD-10-CM | POA: Diagnosis not present

## 2014-04-26 DIAGNOSIS — Z720 Tobacco use: Secondary | ICD-10-CM | POA: Insufficient documentation

## 2014-04-26 DIAGNOSIS — Z87448 Personal history of other diseases of urinary system: Secondary | ICD-10-CM | POA: Insufficient documentation

## 2014-04-26 DIAGNOSIS — B349 Viral infection, unspecified: Secondary | ICD-10-CM | POA: Diagnosis not present

## 2014-04-26 DIAGNOSIS — R509 Fever, unspecified: Secondary | ICD-10-CM | POA: Diagnosis present

## 2014-04-26 DIAGNOSIS — Z8571 Personal history of Hodgkin lymphoma: Secondary | ICD-10-CM | POA: Diagnosis not present

## 2014-04-26 DIAGNOSIS — R112 Nausea with vomiting, unspecified: Secondary | ICD-10-CM | POA: Diagnosis not present

## 2014-04-26 DIAGNOSIS — I10 Essential (primary) hypertension: Secondary | ICD-10-CM | POA: Diagnosis not present

## 2014-04-26 DIAGNOSIS — Z8639 Personal history of other endocrine, nutritional and metabolic disease: Secondary | ICD-10-CM | POA: Diagnosis not present

## 2014-04-26 DIAGNOSIS — Z791 Long term (current) use of non-steroidal anti-inflammatories (NSAID): Secondary | ICD-10-CM | POA: Diagnosis not present

## 2014-04-26 LAB — COMPREHENSIVE METABOLIC PANEL
ALK PHOS: 91 U/L (ref 39–117)
ALT: 19 U/L (ref 0–53)
AST: 39 U/L — AB (ref 0–37)
Albumin: 4.2 g/dL (ref 3.5–5.2)
Anion gap: 13 (ref 5–15)
BUN: 12 mg/dL (ref 6–23)
CO2: 24 mmol/L (ref 19–32)
Calcium: 9.3 mg/dL (ref 8.4–10.5)
Chloride: 102 mmol/L (ref 96–112)
Creatinine, Ser: 1.31 mg/dL (ref 0.50–1.35)
GFR, EST AFRICAN AMERICAN: 76 mL/min — AB (ref >90)
GFR, EST NON AFRICAN AMERICAN: 66 mL/min — AB (ref >90)
Glucose, Bld: 105 mg/dL — ABNORMAL HIGH (ref 70–99)
POTASSIUM: 3.4 mmol/L — AB (ref 3.5–5.1)
SODIUM: 139 mmol/L (ref 135–145)
Total Bilirubin: 1.3 mg/dL — ABNORMAL HIGH (ref 0.3–1.2)
Total Protein: 7.5 g/dL (ref 6.0–8.3)

## 2014-04-26 LAB — CBC WITH DIFFERENTIAL/PLATELET
BASOS ABS: 0 10*3/uL (ref 0.0–0.1)
Basophils Relative: 0 % (ref 0–1)
EOS ABS: 0 10*3/uL (ref 0.0–0.7)
Eosinophils Relative: 0 % (ref 0–5)
HCT: 40.9 % (ref 39.0–52.0)
HEMOGLOBIN: 14.3 g/dL (ref 13.0–17.0)
Lymphocytes Relative: 24 % (ref 12–46)
Lymphs Abs: 1 10*3/uL (ref 0.7–4.0)
MCH: 31.4 pg (ref 26.0–34.0)
MCHC: 35 g/dL (ref 30.0–36.0)
MCV: 89.7 fL (ref 78.0–100.0)
MONO ABS: 0.8 10*3/uL (ref 0.1–1.0)
MONOS PCT: 18 % — AB (ref 3–12)
NEUTROS PCT: 58 % (ref 43–77)
Neutro Abs: 2.6 10*3/uL (ref 1.7–7.7)
Platelets: 155 10*3/uL (ref 150–400)
RBC: 4.56 MIL/uL (ref 4.22–5.81)
RDW: 13.6 % (ref 11.5–15.5)
WBC: 4.4 10*3/uL (ref 4.0–10.5)

## 2014-04-26 LAB — LIPASE, BLOOD: LIPASE: 37 U/L (ref 11–59)

## 2014-04-26 MED ORDER — IOHEXOL 300 MG/ML  SOLN
80.0000 mL | Freq: Once | INTRAMUSCULAR | Status: AC | PRN
  Administered 2014-04-26: 80 mL via INTRAVENOUS

## 2014-04-26 MED ORDER — HYDROMORPHONE HCL 1 MG/ML IJ SOLN
1.0000 mg | Freq: Once | INTRAMUSCULAR | Status: AC
  Administered 2014-04-26: 1 mg via INTRAVENOUS
  Filled 2014-04-26: qty 1

## 2014-04-26 MED ORDER — ONDANSETRON HCL 4 MG/2ML IJ SOLN
4.0000 mg | Freq: Once | INTRAMUSCULAR | Status: AC
  Administered 2014-04-26: 4 mg via INTRAVENOUS
  Filled 2014-04-26: qty 2

## 2014-04-26 MED ORDER — SODIUM CHLORIDE 0.9 % IV BOLUS (SEPSIS)
1000.0000 mL | Freq: Once | INTRAVENOUS | Status: AC
  Administered 2014-04-26: 1000 mL via INTRAVENOUS

## 2014-04-26 MED ORDER — IOHEXOL 300 MG/ML  SOLN
25.0000 mL | INTRAMUSCULAR | Status: AC
  Administered 2014-04-26: 25 mL via ORAL

## 2014-04-26 MED ORDER — ONDANSETRON HCL 4 MG PO TABS: 4.0000 mg | ORAL_TABLET | Freq: Four times a day (QID) | ORAL | Status: DC

## 2014-04-26 MED ORDER — HYDROCODONE-ACETAMINOPHEN 5-325 MG PO TABS: 1.0000 | ORAL_TABLET | Freq: Four times a day (QID) | ORAL | Status: DC | PRN

## 2014-04-26 NOTE — ED Notes (Signed)
Pt reports that his wife had the flu and then he had it. Pt recovered from flu but now having vomiting and abdominal pain for past 4 days.

## 2014-04-26 NOTE — ED Notes (Signed)
Pt currently in CT.

## 2014-04-26 NOTE — ED Provider Notes (Signed)
CSN: [id number]     Arrival date & time 04/26/14  1003 History   First MD Initiated Contact with Patient 04/26/14 1035     Chief Complaint  Patient presents with  . Emesis  . Abdominal Pain  . Fever     (Consider location/radiation/quality/duration/timing/severity/associated sxs/prior Treatment) HPI Comments: Patient with past medical history of Hodgkin's disease presents emergency department with chief complaint of abdominal pain 3-4 days. Patient states the pain acutely worsened this morning. He states the pain is severe. It is worsened with palpation. He also reports associated nausea and vomiting, but denies diarrhea. States that his wife was recently sick with the flu, and thought that he had the same, but when the abdominal pain worsened he decided to come for evaluation. 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 He has tried taking Tylenol as well as NyQuil with some relief. He reports an associated fever as high as 103, but this improved with the Tylenol. He denies any other symptoms.  The history is provided by the patient. No language interpreter was used.    Past Medical History  Diagnosis Date  . History of Hodgkin's disease   . ED (erectile dysfunction)   . Sarcoid   . Hypertension   . Hyperlipidemia    Past Surgical History  Procedure Laterality Date  . Lung biopsy     No family history on file. History  Substance Use Topics  . Smoking status: Current Every Day Smoker    Types: Cigarettes  . Smokeless tobacco: Never Used  . Alcohol Use: Yes     Comment: occasionally    Review of Systems  Constitutional: Negative for fever and chills.  Respiratory: Negative for shortness of breath.   Cardiovascular: Negative for chest pain.  Gastrointestinal: Positive for nausea, vomiting and abdominal pain. Negative for diarrhea and constipation.  Genitourinary: Negative for dysuria.  All other systems reviewed and are negative.     Allergies  Review  of patient's allergies indicates no known allergies.  Home Medications   Prior to Admission medications   Medication Sig Start Date End Date Taking? Authorizing Provider  ibuprofen (ADVIL,MOTRIN) 800 MG tablet Take 1 tablet (800 mg total) by mouth 3 (three) times daily. 01/06/13   Kristen N Ward, DO  oxyCODONE-acetaminophen (PERCOCET/ROXICET) 5-325 MG per tablet Take 1 tablet by mouth every 4 (four) hours as needed. 01/06/13   Kristen N Ward, DO   BP 142/85 mmHg  Pulse 78  Temp(Src) 98.5 F (36.9 C) (Oral)  Resp 18  Ht 5\' 5"  (1.651 m)  Wt 145 lb (65.772 kg)  BMI 24.13 kg/m2  SpO2 99% Physical Exam  Constitutional: He is oriented to person, place, and time. He appears well-developed and well-nourished.  HENT:  Head: Normocephalic and atraumatic.  Eyes: Conjunctivae and EOM are normal. Pupils are equal, round, and reactive to light. Right eye exhibits no discharge. Left eye exhibits no discharge. No scleral icterus.  Neck: Normal range of motion. Neck supple. No JVD present.  Cardiovascular: Normal rate, regular rhythm and normal heart sounds.  Exam reveals no gallop and no friction rub.   No murmur heard. Pulmonary/Chest: Effort normal and breath sounds normal. No respiratory distress. He has no wheezes. He has no rales. He exhibits no tenderness.  Abdominal: Soft. He exhibits no distension and no mass. There is tenderness. There is guarding. There is no rebound.  Dffuse abdominal tenderness, no focal tenderness  Musculoskeletal: Normal range of motion. He exhibits no edema or tenderness.  Neurological: He is alert and oriented to person, place, and time.  Skin: Skin is warm and dry.  Psychiatric: He has a normal mood and affect. His behavior is normal. Judgment and thought content normal.  Nursing note and vitals reviewed.   ED Course  Procedures (including critical care time) Results for orders placed or performed during the hospital encounter of 04/26/14  CBC with  Differential  Result Value Ref Range   WBC 4.4 4.0 - 10.5 K/uL   RBC 4.56 4.22 - 5.81 MIL/uL   Hemoglobin 14.3 13.0 - 17.0 g/dL   HCT 40.9 39.0 - 52.0 %   MCV 89.7 78.0 - 100.0 fL   MCH 31.4 26.0 - 34.0 pg   MCHC 35.0 30.0 - 36.0 g/dL   RDW 13.6 11.5 - 15.5 %   Platelets 155 150 - 400 K/uL   Neutrophils Relative % 58 43 - 77 %   Neutro Abs 2.6 1.7 - 7.7 K/uL   Lymphocytes Relative 24 12 - 46 %   Lymphs Abs 1.0 0.7 - 4.0 K/uL   Monocytes Relative 18 (H) 3 - 12 %   Monocytes Absolute 0.8 0.1 - 1.0 K/uL   Eosinophils Relative 0 0 - 5 %   Eosinophils Absolute 0.0 0.0 - 0.7 K/uL   Basophils Relative 0 0 - 1 %   Basophils Absolute 0.0 0.0 - 0.1 K/uL  Comprehensive metabolic panel  Result Value Ref Range   Sodium 139 135 - 145 mmol/L   Potassium 3.4 (L) 3.5 - 5.1 mmol/L   Chloride 102 96 - 112 mmol/L   CO2 24 19 - 32 mmol/L   Glucose, Bld 105 (H) 70 - 99 mg/dL   BUN 12 6 - 23 mg/dL   Creatinine, Ser 1.31 0.50 - 1.35 mg/dL   Calcium 9.3 8.4 - 10.5 mg/dL   Total Protein 7.5 6.0 - 8.3 g/dL   Albumin 4.2 3.5 - 5.2 g/dL   AST 39 (H) 0 - 37 U/L   ALT 19 0 - 53 U/L   Alkaline Phosphatase 91 39 - 117 U/L   Total Bilirubin 1.3 (H) 0.3 - 1.2 mg/dL   GFR calc non Af Amer 66 (L) >90 mL/min   GFR calc Af Amer 76 (L) >90 mL/min   Anion gap 13 5 - 15  Lipase, blood  Result Value Ref Range   Lipase 37 11 - 59 U/L   Ct Abdomen Pelvis W Contrast  04/26/2014   CLINICAL DATA:  Left-sided abdominal pain with nausea and vomiting  EXAM: CT ABDOMEN AND PELVIS WITH CONTRAST  TECHNIQUE: Multidetector CT imaging of the abdomen and pelvis was performed using the standard protocol following bolus administration of intravenous contrast. Oral contrast was also administered.  CONTRAST:  73mL OMNIPAQUE IOHEXOL 300 MG/ML  SOLN  COMPARISON:  November 17, 2011  FINDINGS: There is a partially calcified nodular opacity arising from the pleura in the left base posteriorly, stable. Lung bases are otherwise clear and  unchanged.  No focal liver lesions are identified. The gallbladder wall is not appreciably thickened. There is no biliary duct dilatation.  Spleen, pancreas, and adrenals appear normal.  There is a 1.3 x 1.1 cm cyst arising from the medial right mid kidney. There is a 7 mm cyst in the anterior mid right kidney. There is a 1.0 x 0.7 cm cyst in the lateral right mid kidney. There is no hydronephrosis on either side. There is no renal or ureteral calculus on either side.  In  the pelvis, the urinary bladder is midline with normal wall thickness. There is no pelvic mass or pelvic fluid collection. The rectum is mildly distended with air. The appendix region appears normal. Terminal ileum appears normal.  There are surgical clips in the left inguinal region. There is a stable enlarged left inguinal lymph node measuring 1.6 x 1.4 cm. No other lymphadenopathy is seen in the abdomen or pelvis.  No bowel obstruction. No free air or portal venous air. No appreciable ascites or abscess in the abdomen or pelvis. There is atherosclerotic change in the aorta and iliac arteries without aneurysm. There are no blastic or lytic bone lesions.  IMPRESSION: Stable enlarged left inguinal lymph node. Stability of this lymph node prominence since prior study is indicative of benign etiology. No new adenopathy. There is evidence of surgical clips near this enlarged lymph node in the left inguinal region, stable. No other lymph node prominence in the abdomen or pelvis.  No bowel obstruction. No abscess. No inflammatory focus in the mesenteric.  No renal or ureteral calculus.  No hydronephrosis.  Stable benign-appearing partially calcified lesion arising from the pleura in the posterior left base. Stability of this lesion since prior study is indicative of benign etiology.   Electronically Signed   By: Lowella Grip III M.D.   On: 04/26/2014 12:48      EKG Interpretation None      MDM   Final diagnoses:  Abdominal pain  Viral  syndrome    Patient with moderate to severe abdominal pain. Reported fever and vomiting. Will check CT scan, labs, and will treat pain. Will reassess.  Patient still having some abdominal pain, will order Dilaudid.  Pain has improved. Vital signs still stable.  CT scan unremarkable for acute process. Stable enlarged inguinal lymph node which is been seen previously and the patient has also had this biopsied. No other acute process. Labs are reassuring. Flu is possibility given that his wife recently had the same. Will plan for discharge with strict abdominal return precautions. Patient understands and agrees with the plan. He is stable and ready for discharge.  Patient instructed to return for:  New or worsening symptoms, including, increased abdominal pain, especially pain that localizes to one side, bloody vomit, bloody diarrhea, fever >101, and intractable vomiting.      Montine Circle, PA-C 04/26/14 1433  Dorie Rank, MD 04/26/14 [phone]

## 2014-04-26 NOTE — ED Notes (Signed)
Pt states he is still feeling nauseated. PA Rob made aware.

## 2014-04-26 NOTE — ED Notes (Signed)
Patient transported to CT 

## 2014-04-26 NOTE — ED Notes (Addendum)
Pt was getting dressed and became nauseated with some dizziness. Pt appears moderatly diaphoretic. ROB PA notified. VSS. Pt placed back in bed and on monitor. Pt also given gingerale to sip on.

## 2014-04-26 NOTE — ED Notes (Signed)
NAD at this time. Pt reports that he is feeling better.

## 2014-04-26 NOTE — ED Notes (Signed)
Ambulated pt per PA Browning. Pt ambulated with NAD distress, with c/o of intermittent nausea. PT d/c home.

## 2014-04-26 NOTE — Discharge Instructions (Signed)

## 2014-06-22 ENCOUNTER — Encounter (HOSPITAL_COMMUNITY): Payer: Self-pay | Admitting: Emergency Medicine

## 2014-06-22 DIAGNOSIS — Y92810 Car as the place of occurrence of the external cause: Secondary | ICD-10-CM | POA: Insufficient documentation

## 2014-06-22 DIAGNOSIS — Z862 Personal history of diseases of the blood and blood-forming organs and certain disorders involving the immune mechanism: Secondary | ICD-10-CM | POA: Diagnosis not present

## 2014-06-22 DIAGNOSIS — Z8639 Personal history of other endocrine, nutritional and metabolic disease: Secondary | ICD-10-CM | POA: Diagnosis not present

## 2014-06-22 DIAGNOSIS — Z9889 Other specified postprocedural states: Secondary | ICD-10-CM | POA: Diagnosis not present

## 2014-06-22 DIAGNOSIS — Z8571 Personal history of Hodgkin lymphoma: Secondary | ICD-10-CM | POA: Diagnosis not present

## 2014-06-22 DIAGNOSIS — M7022 Olecranon bursitis, left elbow: Secondary | ICD-10-CM | POA: Diagnosis not present

## 2014-06-22 DIAGNOSIS — Z87438 Personal history of other diseases of male genital organs: Secondary | ICD-10-CM | POA: Insufficient documentation

## 2014-06-22 DIAGNOSIS — W228XXA Striking against or struck by other objects, initial encounter: Secondary | ICD-10-CM | POA: Insufficient documentation

## 2014-06-22 DIAGNOSIS — S59902A Unspecified injury of left elbow, initial encounter: Secondary | ICD-10-CM | POA: Diagnosis present

## 2014-06-22 DIAGNOSIS — Y998 Other external cause status: Secondary | ICD-10-CM | POA: Insufficient documentation

## 2014-06-22 DIAGNOSIS — Z720 Tobacco use: Secondary | ICD-10-CM | POA: Insufficient documentation

## 2014-06-22 DIAGNOSIS — Y9389 Activity, other specified: Secondary | ICD-10-CM | POA: Insufficient documentation

## 2014-06-22 DIAGNOSIS — I10 Essential (primary) hypertension: Secondary | ICD-10-CM | POA: Diagnosis not present

## 2014-06-22 LAB — BASIC METABOLIC PANEL
ANION GAP: 8 (ref 5–15)
BUN: 14 mg/dL (ref 6–20)
CALCIUM: 9.2 mg/dL (ref 8.9–10.3)
CO2: 25 mmol/L (ref 22–32)
Chloride: 107 mmol/L (ref 101–111)
Creatinine, Ser: 1.07 mg/dL (ref 0.61–1.24)
GFR calc non Af Amer: >60 mL/min (ref >60)
Glucose, Bld: 102 mg/dL — ABNORMAL HIGH (ref 65–99)
Potassium: 3.6 mmol/L (ref 3.5–5.1)
SODIUM: 140 mmol/L (ref 135–145)

## 2014-06-22 LAB — CBC WITH DIFFERENTIAL/PLATELET
BASOS ABS: 0 10*3/uL (ref 0.0–0.1)
Basophils Relative: 0 % (ref 0–1)
EOS PCT: 1 % (ref 0–5)
Eosinophils Absolute: 0.1 10*3/uL (ref 0.0–0.7)
HCT: 39.2 % (ref 39.0–52.0)
HEMOGLOBIN: 13.6 g/dL (ref 13.0–17.0)
LYMPHS ABS: 2.2 10*3/uL (ref 0.7–4.0)
LYMPHS PCT: 20 % (ref 12–46)
MCH: 30.6 pg (ref 26.0–34.0)
MCHC: 34.7 g/dL (ref 30.0–36.0)
MCV: 88.1 fL (ref 78.0–100.0)
MONOS PCT: 9 % (ref 3–12)
Monocytes Absolute: 1 10*3/uL (ref 0.1–1.0)
NEUTROS ABS: 7.4 10*3/uL (ref 1.7–7.7)
Neutrophils Relative %: 70 % (ref 43–77)
Platelets: 233 10*3/uL (ref 150–400)
RBC: 4.45 MIL/uL (ref 4.22–5.81)
RDW: 13.9 % (ref 11.5–15.5)
WBC: 10.6 10*3/uL — AB (ref 4.0–10.5)

## 2014-06-22 MED ORDER — OXYCODONE-ACETAMINOPHEN 5-325 MG PO TABS
1.0000 | ORAL_TABLET | Freq: Once | ORAL | Status: AC
  Administered 2014-06-22: 1 via ORAL
  Filled 2014-06-22: qty 1

## 2014-06-22 NOTE — ED Notes (Signed)
Pt reports pain to L arm for 2 weeks with night sweats. Pt has rod in arm. 2 days ago he hit his elbow on door. Since then area has been draining purulent bloody fluid.

## 2014-06-23 ENCOUNTER — Emergency Department (HOSPITAL_COMMUNITY): Payer: Medicare Other

## 2014-06-23 ENCOUNTER — Emergency Department (HOSPITAL_COMMUNITY)
Admission: EM | Admit: 2014-06-23 | Discharge: 2014-06-23 | Disposition: A | Discharge status: Home / Self Care | Payer: Medicare Other | Attending: Emergency Medicine | Admitting: Emergency Medicine

## 2014-06-23 DIAGNOSIS — M7022 Olecranon bursitis, left elbow: Secondary | ICD-10-CM

## 2014-06-23 MED ORDER — OXYCODONE-ACETAMINOPHEN 5-325 MG PO TABS: 1.0000 | ORAL_TABLET | Freq: Four times a day (QID) | ORAL | Status: DC | PRN

## 2014-06-23 MED ORDER — KETOROLAC TROMETHAMINE 60 MG/2ML IM SOLN
60.0000 mg | Freq: Once | INTRAMUSCULAR | Status: AC
  Administered 2014-06-23: 60 mg via INTRAMUSCULAR
  Filled 2014-06-23: qty 2

## 2014-06-23 NOTE — Discharge Instructions (Signed)
Bursitis °Bursitis is a swelling and soreness (inflammation) of a fluid-filled sac (bursa) that overlies and protects a joint. It can be caused by injury, overuse of the joint, arthritis or infection. The joints most likely to be affected are the elbows, shoulders, hips and knees. °HOME CARE INSTRUCTIONS  °· Apply ice to the affected area for 15-20 minutes each hour while awake for 2 days. Put the ice in a plastic bag and place a towel between the bag of ice and your skin. °· Rest the injured joint as much as possible, but continue to put the joint through a full range of motion, 4 times per day. (The shoulder joint especially becomes rapidly "frozen" if not used.) When the pain lessens, begin normal slow movements and usual activities. °· Only take over-the-counter or prescription medicines for pain, discomfort or fever as directed by your caregiver. °· Your caregiver may recommend draining the bursa and injecting medicine into the bursa. This may help the healing process. °· Follow all instructions for follow-up with your caregiver. This includes any orthopedic referrals, physical therapy and rehabilitation. Any delay in obtaining necessary care could result in a delay or failure of the bursitis to heal and chronic pain. °SEEK IMMEDIATE MEDICAL CARE IF:  °· Your pain increases even during treatment. °· You develop an oral temperature above 102° F (38.9° C) and have heat and inflammation over the involved bursa. °MAKE SURE YOU:  °· Understand these instructions. °· Will watch your condition. °· Will get help right away if you are not doing well or get worse. °Document Released: 01/06/2000 Document Revised: 04/02/2011 Document Reviewed: 03/30/2013 °[employer]® Patient Information ©2015 [employer]. This information is not intended to replace advice given to you by your health care provider. Make sure you discuss any questions you have with your health care provider. ° °

## 2014-06-23 NOTE — ED Provider Notes (Signed)
CSN: [id number]     Arrival date & time 06/22/14  2053 History  This chart was scribed for Jorge Pert, MD by Meriel Pica, ED Scribe. This patient was seen in room A08C/A08C and the patient's care was started 3:22 AM.    Chief Complaint  Patient presents with  . Arm Injury   Patient is a 44 y.o. male presenting with arm injury. The history is provided by the patient. No language interpreter was used.  Arm Injury Location:  Arm and elbow Time since incident:  1 day Injury: yes   Arm location:  L arm Elbow location:  L elbow Pain details:    Quality:  Burning   Radiates to:  Does not radiate   Severity:  Moderate   Onset quality:  Sudden   Duration:  1 day   Timing:  Constant   Progression:  Worsening Chronicity:  Recurrent Handedness:  Left-handed Worsened by:  Movement Ineffective treatments:  Acetaminophen and narcotics Associated symptoms: fever   Associated symptoms: no back pain and no neck pain    HPI Comments: [name] is a 44 y.o. male, with a PMhx arm surgery, of who presents to the Emergency Department complaining of constant, moderate, burning left arm pain onset 2 weeks ago with associated tingling in his left arm, night sweats, nausea, and subjective fever. He reports a purulent discharge with blood that has been occurring for 1 month. Pt reports the symptoms were exacerbated yesterday when he hit his elbow while getting out of the car. He notes he had a rod put into his left arm in 2002 after an injury. He reports that he has had problems with his left arm ever since the surgery but it has been worse lately. He has been seen in the ED in 2013 for drainage from his left elbow. Pt has taken tylenol and an old hydrocodone medication pta with no relief. Pt reports he is left-handed and the pain is making it hard to preform activities of daily living.    Past Medical History  Diagnosis Date  . History of Hodgkin's disease   . ED (erectile dysfunction)    . Sarcoid   . Hypertension   . Hyperlipidemia    Past Surgical History  Procedure Laterality Date  . Lung biopsy    . Arm surgery Left 2001   No family history on file. History  Substance Use Topics  . Smoking status: Current Every Day Smoker    Types: Cigarettes  . Smokeless tobacco: Never Used  . Alcohol Use: Yes     Comment: occasionally    Review of Systems  Constitutional: Positive for fever, chills and diaphoresis.  HENT: Negative for congestion, rhinorrhea and sore throat.   Eyes: Negative for visual disturbance.  Respiratory: Negative for cough and shortness of breath.   Cardiovascular: Negative for chest pain and leg swelling.  Gastrointestinal: Positive for nausea. Negative for vomiting, abdominal pain and diarrhea.  Genitourinary: Negative for dysuria.  Musculoskeletal: Positive for myalgias and arthralgias. Negative for back pain and neck pain.  Skin: Positive for wound. Negative for rash.  Neurological: Positive for numbness ( tingling in left arm ). Negative for headaches.  Hematological: Does not bruise/bleed easily.  Psychiatric/Behavioral: Negative for confusion.  All other systems reviewed and are negative.   Allergies  Review of patient's allergies indicates no known allergies.  Home Medications   Prior to Admission medications   Medication Sig Start Date End Date Taking? Authorizing Provider  acetaminophen (TYLENOL)  500 MG tablet Take 1,000 mg by mouth every 6 (six) hours as needed for mild pain or fever.    Yes Historical Provider, MD  Pseudoeph-Doxylamine-DM-APAP (NYQUIL PO) Take 1 Dose by mouth 2 (two) times daily as needed (cold symptoms).   Yes Historical Provider, MD  HYDROcodone-acetaminophen (NORCO/VICODIN) 5-325 MG per tablet Take 1-2 tablets by mouth every 6 (six) hours as needed. Patient not taking: Reported on 06/23/2014 04/26/14   Montine Circle, PA-C  ondansetron (ZOFRAN) 4 MG tablet Take 1 tablet (4 mg total) by mouth every 6 (six)  hours. Patient not taking: Reported on 06/23/2014 04/26/14   Montine Circle, PA-C  oxyCODONE-acetaminophen (PERCOCET/ROXICET) 5-325 MG per tablet Take 1 tablet by mouth every 4 (four) hours as needed. Patient not taking: Reported on 04/26/2014 01/06/13   Kristen N Ward, DO   BP 138/92 mmHg  Pulse 74  Temp(Src) 98.2 F (36.8 C) (Oral)  Resp 24  Ht 5\' 5"  (1.651 m)  Wt 160 lb (72.576 kg)  BMI 26.63 kg/m2  SpO2 97%  Physical Exam  Constitutional: He is oriented to person, place, and time. He appears well-developed and well-nourished. No distress.  HENT:  Head: Normocephalic and atraumatic.  Mouth/Throat: Oropharynx is clear and moist.  Eyes: Conjunctivae and EOM are normal. Pupils are equal, round, and reactive to light.  Neck: Normal range of motion. Neck supple. No tracheal deviation present.  Cardiovascular: Normal rate and normal heart sounds.  Exam reveals no gallop and no friction rub.   No murmur heard. Pulmonary/Chest: Breath sounds normal. No respiratory distress.  Abdominal: Soft. He exhibits no distension. There is no tenderness. There is no rebound and no guarding.  Musculoskeletal: Normal range of motion. He exhibits no edema or tenderness.  Moderately limited ROM of left elbow. Chronic appearing excoriation of the posterior aspect of the elbow without evidence of drainage. Mild swelling of the olecranon bursa. Normal motor skills of left hand and 2 + distal pulses in the left upper extremity.   Neurological: He is alert and oriented to person, place, and time.  Skin: Skin is warm and dry.  Psychiatric: He has a normal mood and affect. His behavior is normal.  Nursing note and vitals reviewed.   ED Course  Procedures  DIAGNOSTIC STUDIES: Oxygen Saturation is 99% on RA, normal by my interpretation.    COORDINATION OF CARE: 3:35 AM Discussed treatment plan which includes to order and prescribe pain medication and to give ortho referral with pt. Pt acknowledges and agrees  to plan.   Labs Review Labs Reviewed  CBC WITH DIFFERENTIAL/PLATELET - Abnormal; Notable for the following:    WBC 10.6 (*)    All other components within normal limits  BASIC METABOLIC PANEL - Abnormal; Notable for the following:    Glucose, Bld 102 (*)    All other components within normal limits    Imaging Review Dg Elbow Complete Left  06/23/2014   CLINICAL DATA:  Remote ORIF, open wound from 2013. Closed arm in door tonight.  EXAM: LEFT ELBOW - COMPLETE 3+ VIEW; LEFT FOREARM - 2 VIEW  COMPARISON:  LEFT elbow January 06, 2013  FINDINGS: No acute fracture deformity. No dislocation. Two intact olecranon nails, the proximal aspect is extraosseous, unchanged. Mild periprosthetic lucency is similar. Intact olecranon cerclage wires. No destructive bony lesions. Posterior elbow soft tissue swelling without subcutaneous gas or radiopaque foreign bodies.  IMPRESSION: No acute fracture deformity or dislocation.  Olecranon ORIF, with similar mild periprosthetic lucency at the distal nail.  Posterior elbow soft tissue swelling could reflect bursitis.   Electronically Signed   By: Elon Alas M.D.   On: 06/23/2014 00:54   Dg Forearm Left  06/23/2014   CLINICAL DATA:  Remote ORIF, open wound from 2013. Closed arm in door tonight.  EXAM: LEFT ELBOW - COMPLETE 3+ VIEW; LEFT FOREARM - 2 VIEW  COMPARISON:  LEFT elbow January 06, 2013  FINDINGS: No acute fracture deformity. No dislocation. Two intact olecranon nails, the proximal aspect is extraosseous, unchanged. Mild periprosthetic lucency is similar. Intact olecranon cerclage wires. No destructive bony lesions. Posterior elbow soft tissue swelling without subcutaneous gas or radiopaque foreign bodies.  IMPRESSION: No acute fracture deformity or dislocation.  Olecranon ORIF, with similar mild periprosthetic lucency at the distal nail.  Posterior elbow soft tissue swelling could reflect bursitis.   Electronically Signed   By: Elon Alas M.D.   On:  06/23/2014 00:54     EKG Interpretation None          MDM   Final diagnoses:  Olecranon bursitis, left    7:47 AM 44 y.o. male with history of remote surgery to left elbow and chronic left elbow pain who presents with left elbow pain. He states that he hit his left elbow while getting out of the car yesterday and he has had worsening pain since that time. He also notes some bloody drainage from the wound on the posterior aspect of the elbow for about one month. He denies any fevers. He does have a history of bursitis and appears to have a chronic appearing excoriation on the elbow as pictured above. There is no drainage on my exam. He does have mild swelling of the bursa and I suspect he has bursitis related to hitting the elbow yesterday on the car. I do not think this is a septic joint. Imaging non-contrib. Pain was controlled here with Toradol. Will give pain medicine for home and recommend follow-up with orthopedics.   I personally performed the services described in this documentation, which was scribed in my presence. The recorded information has been reviewed and is accurate.    Jorge Pert, MD 06/23/14 [phone]

## 2014-06-27 ENCOUNTER — Encounter (HOSPITAL_COMMUNITY): Payer: Self-pay | Admitting: Emergency Medicine

## 2014-06-27 ENCOUNTER — Emergency Department (HOSPITAL_COMMUNITY)
Admission: EM | Admit: 2014-06-27 | Discharge: 2014-06-28 | Disposition: A | Discharge status: Home / Self Care | Payer: Medicare Other | Source: Home / Self Care | Attending: Emergency Medicine | Admitting: Emergency Medicine

## 2014-06-27 DIAGNOSIS — Z862 Personal history of diseases of the blood and blood-forming organs and certain disorders involving the immune mechanism: Secondary | ICD-10-CM | POA: Insufficient documentation

## 2014-06-27 DIAGNOSIS — Z79891 Long term (current) use of opiate analgesic: Secondary | ICD-10-CM

## 2014-06-27 DIAGNOSIS — E785 Hyperlipidemia, unspecified: Secondary | ICD-10-CM | POA: Diagnosis present

## 2014-06-27 DIAGNOSIS — Z79899 Other long term (current) drug therapy: Secondary | ICD-10-CM

## 2014-06-27 DIAGNOSIS — T84615A Infection and inflammatory reaction due to internal fixation device of left ulna, initial encounter: Secondary | ICD-10-CM | POA: Diagnosis not present

## 2014-06-27 DIAGNOSIS — Z8249 Family history of ischemic heart disease and other diseases of the circulatory system: Secondary | ICD-10-CM

## 2014-06-27 DIAGNOSIS — L03114 Cellulitis of left upper limb: Secondary | ICD-10-CM | POA: Insufficient documentation

## 2014-06-27 DIAGNOSIS — Z8571 Personal history of Hodgkin lymphoma: Secondary | ICD-10-CM | POA: Insufficient documentation

## 2014-06-27 DIAGNOSIS — Z720 Tobacco use: Secondary | ICD-10-CM

## 2014-06-27 DIAGNOSIS — Z87438 Personal history of other diseases of male genital organs: Secondary | ICD-10-CM | POA: Insufficient documentation

## 2014-06-27 DIAGNOSIS — I10 Essential (primary) hypertension: Secondary | ICD-10-CM | POA: Insufficient documentation

## 2014-06-27 DIAGNOSIS — Z87891 Personal history of nicotine dependence: Secondary | ICD-10-CM

## 2014-06-27 DIAGNOSIS — Z8639 Personal history of other endocrine, nutritional and metabolic disease: Secondary | ICD-10-CM | POA: Insufficient documentation

## 2014-06-27 DIAGNOSIS — M7022 Olecranon bursitis, left elbow: Secondary | ICD-10-CM | POA: Diagnosis present

## 2014-06-27 DIAGNOSIS — C8190 Hodgkin lymphoma, unspecified, unspecified site: Secondary | ICD-10-CM | POA: Diagnosis present

## 2014-06-27 DIAGNOSIS — Y831 Surgical operation with implant of artificial internal device as the cause of abnormal reaction of the patient, or of later complication, without mention of misadventure at the time of the procedure: Secondary | ICD-10-CM | POA: Diagnosis present

## 2014-06-27 LAB — CBC WITH DIFFERENTIAL/PLATELET
Basophils Absolute: 0 10*3/uL (ref 0.0–0.1)
Basophils Relative: 0 % (ref 0–1)
Eosinophils Absolute: 0.1 10*3/uL (ref 0.0–0.7)
Eosinophils Relative: 1 % (ref 0–5)
HCT: 36.3 % — ABNORMAL LOW (ref 39.0–52.0)
Hemoglobin: 12.3 g/dL — ABNORMAL LOW (ref 13.0–17.0)
Lymphocytes Relative: 19 % (ref 12–46)
Lymphs Abs: 1.7 10*3/uL (ref 0.7–4.0)
MCH: 30.6 pg (ref 26.0–34.0)
MCHC: 33.9 g/dL (ref 30.0–36.0)
MCV: 90.3 fL (ref 78.0–100.0)
MONO ABS: 0.8 10*3/uL (ref 0.1–1.0)
Monocytes Relative: 9 % (ref 3–12)
NEUTROS ABS: 6.2 10*3/uL (ref 1.7–7.7)
NEUTROS PCT: 71 % (ref 43–77)
PLATELETS: 275 10*3/uL (ref 150–400)
RBC: 4.02 MIL/uL — ABNORMAL LOW (ref 4.22–5.81)
RDW: 13.7 % (ref 11.5–15.5)
WBC: 8.8 10*3/uL (ref 4.0–10.5)

## 2014-06-27 LAB — BASIC METABOLIC PANEL
Anion gap: 8 (ref 5–15)
BUN: 14 mg/dL (ref 6–20)
CO2: 28 mmol/L (ref 22–32)
Calcium: 9.1 mg/dL (ref 8.9–10.3)
Chloride: 106 mmol/L (ref 101–111)
Creatinine, Ser: 0.94 mg/dL (ref 0.61–1.24)
GFR calc Af Amer: >60 mL/min (ref >60)
GFR calc non Af Amer: >60 mL/min (ref >60)
GLUCOSE: 83 mg/dL (ref 65–99)
Potassium: 3.6 mmol/L (ref 3.5–5.1)
SODIUM: 142 mmol/L (ref 135–145)

## 2014-06-27 LAB — SEDIMENTATION RATE: SED RATE: 48 mm/h — AB (ref 0–16)

## 2014-06-27 MED ORDER — HYDROMORPHONE HCL 1 MG/ML IJ SOLN
1.0000 mg | Freq: Once | INTRAMUSCULAR | Status: AC
Start: 2014-06-27 — End: 2014-06-27
  Administered 2014-06-27: 1 mg via INTRAVENOUS
  Filled 2014-06-27: qty 1

## 2014-06-27 MED ORDER — OXYCODONE-ACETAMINOPHEN 5-325 MG PO TABS
2.0000 | ORAL_TABLET | Freq: Once | ORAL | Status: DC
  Filled 2014-06-27: qty 2

## 2014-06-27 MED ORDER — DEXTROSE 5 % IV SOLN
1.0000 g | Freq: Once | INTRAVENOUS | Status: AC
  Administered 2014-06-27: 1 g via INTRAVENOUS
  Filled 2014-06-27: qty 10

## 2014-06-27 MED ORDER — HYDROMORPHONE HCL 1 MG/ML IJ SOLN
1.0000 mg | Freq: Once | INTRAMUSCULAR | Status: AC
  Administered 2014-06-27: 1 mg via INTRAVENOUS
  Filled 2014-06-27: qty 1

## 2014-06-27 MED ORDER — ONDANSETRON HCL 4 MG/2ML IJ SOLN
4.0000 mg | Freq: Once | INTRAMUSCULAR | Status: AC
  Administered 2014-06-27: 4 mg via INTRAVENOUS
  Filled 2014-06-27: qty 2

## 2014-06-27 MED ORDER — CEPHALEXIN 500 MG PO CAPS: 500.0000 mg | ORAL_CAPSULE | Freq: Four times a day (QID) | ORAL | Status: DC

## 2014-06-27 NOTE — ED Provider Notes (Deleted)
CSN: [id number]     Arrival date & time 06/27/14  1600 History   First MD Initiated Contact with Patient 06/27/14 1852    This chart was scribed for non-physician practitioner, Margarita Mail, PA-C, working with Veryl Speak, MD by Terressa Koyanagi, ED Scribe. This patient was seen in room WTR6/WTR6 and the patient's care was started at 7:26 PM.  Chief Complaint  Patient presents with  . Arm Swelling  . Arm Pain   The history is provided by the patient. No language interpreter was used.   PCP: No PCP Per Patient HPI Comments: [name] is a 44 y.o. male, with PMH noted below including left arm surgery resulting in rod and screw palcement in his left arm (surgery completed by Dr. Tommie Raymond in 2002), chronic left arm pain, chronic bursitis, who presents to the Emergency Department complaining of an episode of constant, severe pain to left arm with associated intermittent night sweats, chills, fever, and purulent discharge from left arm onset couple of weeks ago after pt hit his left arm against a car. Pt reports he was seen for the same at [facility] ED on 06/23/14 whereby he was given meds and his left arm placed on a sling without improvement. Pt reports using OTC meds at home and RICE without relief. Pt denies being on pain management.   Past Medical History  Diagnosis Date  . History of Hodgkin's disease   . ED (erectile dysfunction)   . Sarcoid   . Hypertension   . Hyperlipidemia    Past Surgical History  Procedure Laterality Date  . Lung biopsy    . Arm surgery Left 2001   No family history on file. History  Substance Use Topics  . Smoking status: Current Every Day Smoker    Types: Cigarettes  . Smokeless tobacco: Never Used  . Alcohol Use: Yes     Comment: occasionally    Review of Systems  Constitutional: Positive for fever, chills and diaphoresis (night sweats ). Negative for appetite change.  HENT: Negative for sore throat and trouble swallowing.   Eyes: Negative for visual  disturbance.  Musculoskeletal: Negative for gait problem.       Left arm pain with associated swelling   Skin: Negative for color change and rash.  Neurological: Negative for syncope and speech difficulty.  Hematological: Does not bruise/bleed easily.  Psychiatric/Behavioral: Negative for behavioral problems and confusion.   Allergies  Review of patient's allergies indicates no known allergies.  Home Medications   Prior to Admission medications   Medication Sig Start Date End Date Taking? Authorizing Provider  acetaminophen (TYLENOL) 500 MG tablet Take 1,000 mg by mouth every 6 (six) hours as needed for mild pain or fever.     Historical Provider, MD  HYDROcodone-acetaminophen (NORCO/VICODIN) 5-325 MG per tablet Take 1-2 tablets by mouth every 6 (six) hours as needed. Patient not taking: Reported on 06/23/2014 04/26/14   Montine Circle, PA-C  ondansetron (ZOFRAN) 4 MG tablet Take 1 tablet (4 mg total) by mouth every 6 (six) hours. Patient not taking: Reported on 06/23/2014 04/26/14   Montine Circle, PA-C  oxyCODONE-acetaminophen (PERCOCET) 5-325 MG per tablet Take 1-2 tablets by mouth every 6 (six) hours as needed for moderate pain. 06/23/14   [name], MD  oxyCODONE-acetaminophen (PERCOCET/ROXICET) 5-325 MG per tablet Take 1 tablet by mouth every 4 (four) hours as needed. Patient not taking: Reported on 04/26/2014 01/06/13   Kristen N Ward, DO  Pseudoeph-Doxylamine-DM-APAP (NYQUIL PO) Take 1 Dose by mouth  2 (two) times daily as needed (cold symptoms).    Historical Provider, MD   Triage Vitals: BP 151/103 mmHg  Pulse 81  Temp(Src) 98.6 F (37 C) (Oral)  Resp 15  SpO2 100% Physical Exam  Constitutional: He is oriented to person, place, and time. He appears well-developed and well-nourished. No distress.  HENT:  Head: Normocephalic and atraumatic.  Eyes: Conjunctivae and EOM are normal.  Neck: Neck supple.  Cardiovascular: Normal rate.   Pulmonary/Chest: Effort normal. No  respiratory distress.  Musculoskeletal: Normal range of motion.  Neurological: He is alert and oriented to person, place, and time.  Skin: Skin is warm and dry.  Psychiatric: He has a normal mood and affect. His behavior is normal.  Nursing note and vitals reviewed.   ED Course  Procedures (including critical care time) DIAGNOSTIC STUDIES: Oxygen Saturation is 100% on RA, nl by my interpretation.    COORDINATION OF CARE: 7:29 PM-Discussed treatment plan with pt at bedside and pt agreed to plan.   Labs Review Labs Reviewed - No data to display  Imaging Review No results found.   EKG Interpretation None      MDM   Final diagnoses:  None       Margarita Mail, PA-C 06/27/14 2000

## 2014-06-27 NOTE — ED Notes (Signed)
Bed: WA04 Expected date:  Expected time:  Means of arrival:  Comments: tri6

## 2014-06-27 NOTE — ED Provider Notes (Signed)
[name] is a 44 y.o. male, with PMH noted below including left arm surgery resulting in rod and screw palcement in his left arm (surgery completed in 2002), chronic bursitis, and chronic left elbow pain, who presents to the Emergency Department complaining of worsening, ongoing, constant, swelling and pain to left arm and left hand onset a couple of weeks ago. Pt reports he was seen for the same at [facility] ED on 06/23/14 whereby he was given meds and his left arm placed on a sling. Pt reports using OTC meds at home and RICE without relief. C/o soaking night sweats.   O-. The patient appears extremely uncomfortable. Unable to bend the elbow on the left canal. Old surgical scar appears extremely swollen with area of blanching and crusting. He is exquisitely tender to palpation, hot, red, there is swelling down the entire forearm into the left hand. Review of the x-ray shows correlation with hardware in the area of projection from the surgical scar.  A- concern for septic joint. Seen in shared visit with Dr. Lanny Cramp, who will assume care. P-CBC, BMP, sedimentation rate, CRP, fluids, hydromorphone and also consult  Margarita Mail, PA-C 06/27/14 1958  Margarita Mail, PA-C 06/27/14 1959  Veryl Speak, MD 06/27/14 2256

## 2014-06-27 NOTE — ED Notes (Signed)
Pt c/o swelling to L arm and hand x "a couple weeks." Pt has rods and screws in that arm from previous surgery in 2002. Pt sts that [facility] was seen [facility] of June and was given medication and a sling and sent home. Pt sts it has continued to get worse and that he doesn't trust [facility] anymore. Pt sts it's gotten so bad he can't even dress himself. Pt has arm in sling given to him at [facility]. Pt has tried Otc medications, RICE, without relief.

## 2014-06-27 NOTE — ED Provider Notes (Signed)
CSN: [id number]     Arrival date & time 06/27/14  1600 History   First MD Initiated Contact with Patient 06/27/14 1852     Chief Complaint  Patient presents with  . Arm Swelling  . Arm Pain     (Consider location/radiation/quality/duration/timing/severity/associated sxs/prior Treatment) HPI Comments: Patient is a 44 year old male with history of left elbow fracture treated surgically in 2002. He has been having swelling to the left elbow over the past week. He was seen at [facility] several days ago and diagnosed with olecranon bursitis. He presents here today with increased pain and swelling. He denies any fevers or chills. He does state that he bumped his elbow and it did leak a little bit of purulent fluid.  Patient is a 44 y.o. male presenting with arm pain. The history is provided by the patient.  Arm Pain This is a new problem. Episode onset: 1 week ago. The problem occurs constantly. The problem has been rapidly worsening. Exacerbated by: Movement and palpation. Nothing relieves the symptoms. He has tried nothing for the symptoms. The treatment provided no relief.    Past Medical History  Diagnosis Date  . History of Hodgkin's disease   . ED (erectile dysfunction)   . Sarcoid   . Hypertension   . Hyperlipidemia    Past Surgical History  Procedure Laterality Date  . Lung biopsy    . Arm surgery Left 2001   No family history on file. History  Substance Use Topics  . Smoking status: Current Every Day Smoker    Types: Cigarettes  . Smokeless tobacco: Never Used  . Alcohol Use: Yes     Comment: occasionally    Review of Systems  All other systems reviewed and are negative.     Allergies  Review of patient's allergies indicates no known allergies.  Home Medications   Prior to Admission medications   Medication Sig Start Date End Date Taking? Authorizing Provider  acetaminophen (TYLENOL) 500 MG tablet Take 500 mg by mouth every 6 (six) hours as needed for mild  pain or fever (pain).    Yes Historical Provider, MD  ibuprofen (ADVIL,MOTRIN) 200 MG tablet Take 400 mg by mouth every 6 (six) hours as needed for moderate pain (pain).   Yes Historical Provider, MD  HYDROcodone-acetaminophen (NORCO/VICODIN) 5-325 MG per tablet Take 1-2 tablets by mouth every 6 (six) hours as needed. Patient not taking: Reported on 06/27/2014 04/26/14   Montine Circle, PA-C  ondansetron (ZOFRAN) 4 MG tablet Take 1 tablet (4 mg total) by mouth every 6 (six) hours. Patient not taking: Reported on 06/27/2014 04/26/14   Montine Circle, PA-C  oxyCODONE-acetaminophen (PERCOCET) 5-325 MG per tablet Take 1-2 tablets by mouth every 6 (six) hours as needed for moderate pain. Patient not taking: Reported on 06/27/2014 06/23/14   [name], MD  oxyCODONE-acetaminophen (PERCOCET/ROXICET) 5-325 MG per tablet Take 1 tablet by mouth every 4 (four) hours as needed. Patient not taking: Reported on 06/27/2014 01/06/13   Kristen N Ward, DO   BP 151/103 mmHg  Pulse 81  Temp(Src) 98.6 F (37 C) (Oral)  Resp 15  SpO2 100% Physical Exam  Constitutional: He is oriented to person, place, and time. He appears well-developed and well-nourished. No distress.  HENT:  Head: Normocephalic and atraumatic.  Neck: Normal range of motion. Neck supple.  Musculoskeletal:  The left elbow is noted to have a surgical incision near the olecranon extending downward. There is swelling of the soft tissues in this area, however  no crepitus or fluctuance. The area is exquisitely tender and he has pain with range of motion. The distal ulnar and radial pulses are palpable. Sensation and motor are intact to the entire hand.  Neurological: He is alert and oriented to person, place, and time.  Skin: Skin is warm and dry. He is not diaphoretic.  Nursing note and vitals reviewed.   ED Course  Procedures (including critical care time) Labs Review Labs Reviewed  CBC WITH DIFFERENTIAL/PLATELET - Abnormal; Notable for the  following:    RBC 4.02 (*)    Hemoglobin 12.3 (*)    HCT 36.3 (*)    All other components within normal limits  SEDIMENTATION RATE - Abnormal; Notable for the following:    Sed Rate 48 (*)    All other components within normal limits  BASIC METABOLIC PANEL  C-REACTIVE PROTEIN    Imaging Review No results found.   EKG Interpretation None      MDM   Final diagnoses:  None    Patient appears to have cellulitis of the left elbow, possibly originating from olecranon bursitis. He does have hardware in the elbow from his prior surgery and I am also concerned about possible infection of this. His x-rays performed 3 days ago revealed no osseous abnormality in the hardware appeared to be intact. I do not feel the need to repeat these elbow's today as there has been no new injury or trauma.  I've discussed these findings with Dr. Berenice Primas from orthopedics who is recommending a splint, Rocephin, and follow-up in his office tomorrow morning at 9 AM.  Patient has a prescription from his previous visit for Percocet which she has not filled. I will advise him to fill this, prescribe Keflex.    Veryl Speak, MD 06/27/14 2220

## 2014-06-27 NOTE — Discharge Instructions (Signed)
Keflex as prescribed. Fill the prescription for Percocet you were provided with 3 days ago and take these as directed as needed for pain.  Follow-up with Dr. Berenice Primas tomorrow at 9 AM. He is aware of your situation and is expecting to see you then.   Cellulitis Cellulitis is an infection of the skin and the tissue beneath it. The infected area is usually red and tender. Cellulitis occurs most often in the arms and lower legs.  CAUSES  Cellulitis is caused by bacteria that enter the skin through cracks or cuts in the skin. The most common types of bacteria that cause cellulitis are staphylococci and streptococci. SIGNS AND SYMPTOMS   Redness and warmth.  Swelling.  Tenderness or pain.  Fever. DIAGNOSIS  Your health care provider can usually determine what is wrong based on a physical exam. Blood tests may also be done. TREATMENT  Treatment usually involves taking an antibiotic medicine. HOME CARE INSTRUCTIONS   Take your antibiotic medicine as directed by your health care provider. Finish the antibiotic even if you start to feel better.  Keep the infected arm or leg elevated to reduce swelling.  Apply a warm cloth to the affected area up to 4 times per day to relieve pain.  Take medicines only as directed by your health care provider.  Keep all follow-up visits as directed by your health care provider. SEEK MEDICAL CARE IF:   You notice red streaks coming from the infected area.  Your red area gets larger or turns dark in color.  Your bone or joint underneath the infected area becomes painful after the skin has healed.  Your infection returns in the same area or another area.  You notice a swollen bump in the infected area.  You develop new symptoms.  You have a fever. SEEK IMMEDIATE MEDICAL CARE IF:   You feel very sleepy.  You develop vomiting or diarrhea.  You have a general ill feeling (malaise) with muscle aches and pains. MAKE SURE YOU:   Understand these  instructions.  Will watch your condition.  Will get help right away if you are not doing well or get worse. Document Released: 10/18/2004 Document Revised: 05/25/2013 Document Reviewed: 03/26/2011 [facility] Patient Information 2015 [place], [place]. This information is not intended to replace advice given to you by your health care provider. Make sure you discuss any questions you have with your health care provider.

## 2014-06-27 NOTE — ED Notes (Signed)
Ortho tech at bedside 

## 2014-06-28 LAB — C-REACTIVE PROTEIN: CRP: 7.2 mg/dL — AB (ref <1.0)

## 2014-06-29 ENCOUNTER — Encounter (HOSPITAL_COMMUNITY): Payer: Self-pay | Admitting: *Deleted

## 2014-06-29 ENCOUNTER — Other Ambulatory Visit: Payer: Self-pay | Admitting: Orthopedic Surgery

## 2014-06-29 NOTE — Progress Notes (Signed)
Pt denies SOB, chest pain, and being under the care of a cardiologist. Pt denies having a chest x ray and EKG within the last year. Pt made aware to stop taking Aspirin, otc vitamins and herbal medications. Do not take any NSAIDs ie: Ibuprofen, Advil, Naproxen or any medication containing Aspirin. Pt verbalized understanding of all pre-op instructions.

## 2014-06-30 ENCOUNTER — Encounter (HOSPITAL_COMMUNITY)
Admission: RE | Disposition: A | Discharge status: Home / Self Care | Payer: Self-pay | Source: Ambulatory Visit | Attending: Orthopedic Surgery

## 2014-06-30 ENCOUNTER — Ambulatory Visit (HOSPITAL_COMMUNITY): Payer: Medicare Other | Admitting: Anesthesiology

## 2014-06-30 ENCOUNTER — Inpatient Hospital Stay (HOSPITAL_COMMUNITY)
Admission: RE | Admit: 2014-06-30 | Discharge: 2014-07-01 | DRG: 496 | Disposition: A | Discharge status: Home / Self Care | Payer: Medicare Other | Source: Ambulatory Visit | Attending: Orthopedic Surgery | Admitting: Orthopedic Surgery

## 2014-06-30 ENCOUNTER — Other Ambulatory Visit: Payer: Self-pay

## 2014-06-30 ENCOUNTER — Encounter (HOSPITAL_COMMUNITY): Payer: Self-pay | Admitting: *Deleted

## 2014-06-30 DIAGNOSIS — L089 Local infection of the skin and subcutaneous tissue, unspecified: Secondary | ICD-10-CM | POA: Diagnosis present

## 2014-06-30 DIAGNOSIS — Z79891 Long term (current) use of opiate analgesic: Secondary | ICD-10-CM | POA: Diagnosis not present

## 2014-06-30 DIAGNOSIS — E785 Hyperlipidemia, unspecified: Secondary | ICD-10-CM | POA: Diagnosis present

## 2014-06-30 DIAGNOSIS — C8190 Hodgkin lymphoma, unspecified, unspecified site: Secondary | ICD-10-CM | POA: Diagnosis present

## 2014-06-30 DIAGNOSIS — M7022 Olecranon bursitis, left elbow: Secondary | ICD-10-CM | POA: Diagnosis present

## 2014-06-30 DIAGNOSIS — Z969 Presence of functional implant, unspecified: Secondary | ICD-10-CM

## 2014-06-30 DIAGNOSIS — I10 Essential (primary) hypertension: Secondary | ICD-10-CM | POA: Diagnosis present

## 2014-06-30 DIAGNOSIS — T84615A Infection and inflammatory reaction due to internal fixation device of left ulna, initial encounter: Secondary | ICD-10-CM | POA: Diagnosis present

## 2014-06-30 DIAGNOSIS — Z8249 Family history of ischemic heart disease and other diseases of the circulatory system: Secondary | ICD-10-CM | POA: Diagnosis not present

## 2014-06-30 DIAGNOSIS — S50359A Superficial foreign body of unspecified elbow, initial encounter: Secondary | ICD-10-CM

## 2014-06-30 DIAGNOSIS — Y831 Surgical operation with implant of artificial internal device as the cause of abnormal reaction of the patient, or of later complication, without mention of misadventure at the time of the procedure: Secondary | ICD-10-CM | POA: Diagnosis present

## 2014-06-30 DIAGNOSIS — Z79899 Other long term (current) drug therapy: Secondary | ICD-10-CM | POA: Diagnosis not present

## 2014-06-30 DIAGNOSIS — Z87891 Personal history of nicotine dependence: Secondary | ICD-10-CM | POA: Diagnosis not present

## 2014-06-30 DIAGNOSIS — M009 Pyogenic arthritis, unspecified: Secondary | ICD-10-CM | POA: Diagnosis present

## 2014-06-30 HISTORY — DX: Gastro-esophageal reflux disease without esophagitis: K21.9

## 2014-06-30 HISTORY — PX: INCISION AND DRAINAGE ABSCESS: SHX5864

## 2014-06-30 HISTORY — PX: HARDWARE REMOVAL: SHX979

## 2014-06-30 SURGERY — REMOVAL, HARDWARE
Anesthesia: General | Site: Arm Upper | Laterality: Left

## 2014-06-30 MED ORDER — LACTATED RINGERS IV SOLN
INTRAVENOUS | Status: DC
  Administered 2014-06-30: 15:00:00 via INTRAVENOUS

## 2014-06-30 MED ORDER — OXYCODONE-ACETAMINOPHEN 5-325 MG PO TABS
1.0000 | ORAL_TABLET | ORAL | Status: DC | PRN
  Administered 2014-06-30 – 2014-07-01 (×2): 2 via ORAL
  Filled 2014-06-30: qty 2

## 2014-06-30 MED ORDER — LIDOCAINE HCL (CARDIAC) 20 MG/ML IV SOLN
INTRAVENOUS | Status: DC | PRN
  Administered 2014-06-30: 70 mg via INTRAVENOUS

## 2014-06-30 MED ORDER — HYDROMORPHONE HCL 1 MG/ML IJ SOLN
INTRAMUSCULAR | Status: AC
  Filled 2014-06-30: qty 1

## 2014-06-30 MED ORDER — ONDANSETRON HCL 4 MG PO TABS: 4.0000 mg | ORAL_TABLET | Freq: Four times a day (QID) | ORAL | Status: DC | PRN

## 2014-06-30 MED ORDER — FENTANYL CITRATE (PF) 100 MCG/2ML IJ SOLN
INTRAMUSCULAR | Status: DC | PRN
  Administered 2014-06-30: 100 ug via INTRAVENOUS
  Administered 2014-06-30: 50 ug via INTRAVENOUS
  Administered 2014-06-30: 100 ug via INTRAVENOUS

## 2014-06-30 MED ORDER — OXYCODONE-ACETAMINOPHEN 5-325 MG PO TABS
ORAL_TABLET | ORAL | Status: AC
  Filled 2014-06-30: qty 2

## 2014-06-30 MED ORDER — CEFAZOLIN SODIUM-DEXTROSE 2-3 GM-% IV SOLR
INTRAVENOUS | Status: AC
  Filled 2014-06-30: qty 50

## 2014-06-30 MED ORDER — ONDANSETRON HCL 4 MG/2ML IJ SOLN
4.0000 mg | Freq: Four times a day (QID) | INTRAMUSCULAR | Status: DC | PRN
  Administered 2014-06-30 – 2014-07-01 (×2): 4 mg via INTRAVENOUS
  Filled 2014-06-30 (×2): qty 2

## 2014-06-30 MED ORDER — CHLORHEXIDINE GLUCONATE 4 % EX LIQD: 60.0000 mL | Freq: Once | CUTANEOUS | Status: DC

## 2014-06-30 MED ORDER — HYDROMORPHONE HCL 1 MG/ML IJ SOLN
0.2500 mg | INTRAMUSCULAR | Status: DC | PRN
  Administered 2014-06-30 (×3): 0.5 mg via INTRAVENOUS

## 2014-06-30 MED ORDER — KETOROLAC TROMETHAMINE 30 MG/ML IJ SOLN
30.0000 mg | Freq: Once | INTRAMUSCULAR | Status: AC
  Administered 2014-06-30: 30 mg via INTRAVENOUS

## 2014-06-30 MED ORDER — SODIUM CHLORIDE 0.9 % IV SOLN
INTRAVENOUS | Status: DC
  Administered 2014-06-30: 22:00:00 via INTRAVENOUS

## 2014-06-30 MED ORDER — SODIUM CHLORIDE 0.9 % IR SOLN
Status: DC | PRN
  Administered 2014-06-30: 3000 mL

## 2014-06-30 MED ORDER — PROPOFOL 10 MG/ML IV BOLUS
INTRAVENOUS | Status: DC | PRN
  Administered 2014-06-30: 50 mg via INTRAVENOUS
  Administered 2014-06-30: 200 mg via INTRAVENOUS
  Administered 2014-06-30: 50 mg via INTRAVENOUS

## 2014-06-30 MED ORDER — HYDROMORPHONE HCL 1 MG/ML IJ SOLN
0.2500 mg | INTRAMUSCULAR | Status: DC | PRN
  Administered 2014-06-30: 1 mg via INTRAVENOUS

## 2014-06-30 MED ORDER — KETOROLAC TROMETHAMINE 30 MG/ML IJ SOLN
INTRAMUSCULAR | Status: AC
  Filled 2014-06-30: qty 1

## 2014-06-30 MED ORDER — HYDROMORPHONE HCL 1 MG/ML IJ SOLN
0.2500 mg | INTRAMUSCULAR | Status: DC | PRN
  Administered 2014-06-30 (×6): 0.5 mg via INTRAVENOUS

## 2014-06-30 MED ORDER — MEPERIDINE HCL 25 MG/ML IJ SOLN: 6.2500 mg | INTRAMUSCULAR | Status: DC | PRN

## 2014-06-30 MED ORDER — MIDAZOLAM HCL 2 MG/2ML IJ SOLN
INTRAMUSCULAR | Status: AC
  Filled 2014-06-30: qty 2

## 2014-06-30 MED ORDER — LIDOCAINE HCL (CARDIAC) 20 MG/ML IV SOLN
INTRAVENOUS | Status: AC
  Filled 2014-06-30: qty 5

## 2014-06-30 MED ORDER — PROMETHAZINE HCL 25 MG/ML IJ SOLN: 6.2500 mg | INTRAMUSCULAR | Status: DC | PRN

## 2014-06-30 MED ORDER — KETOROLAC TROMETHAMINE 15 MG/ML IJ SOLN
15.0000 mg | Freq: Four times a day (QID) | INTRAMUSCULAR | Status: DC
  Administered 2014-07-01 (×3): 15 mg via INTRAVENOUS
  Filled 2014-06-30 (×7): qty 1

## 2014-06-30 MED ORDER — MIDAZOLAM HCL 5 MG/5ML IJ SOLN
INTRAMUSCULAR | Status: DC | PRN
  Administered 2014-06-30: 2 mg via INTRAVENOUS

## 2014-06-30 MED ORDER — ACETAMINOPHEN 325 MG PO TABS: 650.0000 mg | ORAL_TABLET | Freq: Four times a day (QID) | ORAL | Status: DC | PRN

## 2014-06-30 MED ORDER — CEFAZOLIN SODIUM-DEXTROSE 2-3 GM-% IV SOLR
2.0000 g | Freq: Three times a day (TID) | INTRAVENOUS | Status: DC
  Administered 2014-07-01 (×3): 2 g via INTRAVENOUS
  Filled 2014-06-30 (×5): qty 50

## 2014-06-30 MED ORDER — HYDROMORPHONE HCL 1 MG/ML IJ SOLN
0.5000 mg | INTRAMUSCULAR | Status: DC | PRN
  Administered 2014-07-01 (×2): 1 mg via INTRAVENOUS
  Filled 2014-06-30 (×2): qty 1

## 2014-06-30 MED ORDER — HYDROMORPHONE HCL 1 MG/ML IJ SOLN
INTRAMUSCULAR | Status: AC
  Filled 2014-06-30: qty 2

## 2014-06-30 MED ORDER — FENTANYL CITRATE (PF) 250 MCG/5ML IJ SOLN
INTRAMUSCULAR | Status: AC
  Filled 2014-06-30: qty 5

## 2014-06-30 MED ORDER — CEFAZOLIN SODIUM-DEXTROSE 2-3 GM-% IV SOLR
INTRAVENOUS | Status: DC | PRN
  Administered 2014-06-30: 2 g via INTRAVENOUS

## 2014-06-30 MED ORDER — ONDANSETRON HCL 4 MG/2ML IJ SOLN
INTRAMUSCULAR | Status: AC
  Filled 2014-06-30: qty 2

## 2014-06-30 MED ORDER — ACETAMINOPHEN 650 MG RE SUPP: 650.0000 mg | Freq: Four times a day (QID) | RECTAL | Status: DC | PRN

## 2014-06-30 MED ORDER — 0.9 % SODIUM CHLORIDE (POUR BTL) OPTIME
TOPICAL | Status: DC | PRN
  Administered 2014-06-30: 1000 mL

## 2014-06-30 MED ORDER — PROPOFOL 10 MG/ML IV BOLUS
INTRAVENOUS | Status: AC
  Filled 2014-06-30: qty 20

## 2014-06-30 SURGICAL SUPPLY — 51 items
0.5% BUPIVICAINE 30 ML STERILE PACKAGE IMPLANT
BANDAGE ELASTIC 4 VELCRO ST LF (GAUZE/BANDAGES/DRESSINGS) ×2 IMPLANT
BANDAGE ELASTIC 6 VELCRO ST LF (GAUZE/BANDAGES/DRESSINGS) IMPLANT
BANDAGE ESMARK 6X9 LF (GAUZE/BANDAGES/DRESSINGS) IMPLANT
BNDG CMPR 9X6 STRL LF SNTH (GAUZE/BANDAGES/DRESSINGS)
BNDG COHESIVE 4X5 TAN STRL (GAUZE/BANDAGES/DRESSINGS) ×2 IMPLANT
BNDG ESMARK 6X9 LF (GAUZE/BANDAGES/DRESSINGS)
BNDG GAUZE ELAST 4 BULKY (GAUZE/BANDAGES/DRESSINGS) ×4 IMPLANT
COVER SURGICAL LIGHT HANDLE (MISCELLANEOUS) ×4 IMPLANT
CUFF TOURNIQUET SINGLE 18IN (TOURNIQUET CUFF) ×2 IMPLANT
CUFF TOURNIQUET SINGLE 34IN LL (TOURNIQUET CUFF) IMPLANT
CUFF TOURNIQUET SINGLE 44IN (TOURNIQUET CUFF) IMPLANT
DRAPE C-ARM 42X72 X-RAY (DRAPES) IMPLANT
DRAPE EXTREMITY T 121X128X90 (DRAPE) IMPLANT
DRAPE INCISE IOBAN 66X45 STRL (DRAPES) ×2 IMPLANT
DRAPE ORTHO SPLIT 77X108 STRL (DRAPES) ×8
DRAPE PROXIMA HALF (DRAPES) ×2 IMPLANT
DRAPE SURG ORHT 6 SPLT 77X108 (DRAPES) IMPLANT
DRSG EMULSION OIL 3X3 NADH (GAUZE/BANDAGES/DRESSINGS) ×4 IMPLANT
DRSG PAD ABDOMINAL 8X10 ST (GAUZE/BANDAGES/DRESSINGS) ×4 IMPLANT
ELECT REM PT RETURN 9FT ADLT (ELECTROSURGICAL) ×4
ELECTRODE REM PT RTRN 9FT ADLT (ELECTROSURGICAL) ×2 IMPLANT
GAUZE SPONGE 4X4 12PLY STRL (GAUZE/BANDAGES/DRESSINGS) ×4 IMPLANT
GLOVE BIOGEL PI IND STRL 8 (GLOVE) ×4 IMPLANT
GLOVE BIOGEL PI INDICATOR 8 (GLOVE) ×4
GLOVE ECLIPSE 7.5 STRL STRAW (GLOVE) ×8 IMPLANT
GOWN STRL REUS W/ TWL LRG LVL3 (GOWN DISPOSABLE) ×2 IMPLANT
GOWN STRL REUS W/ TWL XL LVL3 (GOWN DISPOSABLE) ×4 IMPLANT
GOWN STRL REUS W/TWL LRG LVL3 (GOWN DISPOSABLE) ×4
GOWN STRL REUS W/TWL XL LVL3 (GOWN DISPOSABLE) ×8
HANDPIECE INTERPULSE COAX TIP (DISPOSABLE) ×4
KIT BASIN OR (CUSTOM PROCEDURE TRAY) ×4 IMPLANT
KIT ROOM TURNOVER OR (KITS) ×4 IMPLANT
MANIFOLD NEPTUNE II (INSTRUMENTS) ×4 IMPLANT
NDL HYPO 25GX1X1/2 BEV (NEEDLE) IMPLANT
NEEDLE HYPO 25GX1X1/2 BEV (NEEDLE) ×4 IMPLANT
NS IRRIG 1000ML POUR BTL (IV SOLUTION) ×4 IMPLANT
PACK GENERAL/GYN (CUSTOM PROCEDURE TRAY) ×4 IMPLANT
PAD ARMBOARD 7.5X6 YLW CONV (MISCELLANEOUS) ×8 IMPLANT
PAD CAST 4YDX4 CTTN HI CHSV (CAST SUPPLIES) ×2 IMPLANT
PADDING CAST COTTON 4X4 STRL (CAST SUPPLIES) ×4
SET HNDPC FAN SPRY TIP SCT (DISPOSABLE) IMPLANT
STAPLER VISISTAT 35W (STAPLE) ×2 IMPLANT
STOCKINETTE IMPERVIOUS 9X36 MD (GAUZE/BANDAGES/DRESSINGS) ×2 IMPLANT
SUT VIC AB 2-0 CT1 27 (SUTURE) ×8
SUT VIC AB 2-0 CT1 TAPERPNT 27 (SUTURE) IMPLANT
SWAB COLLECTION DEVICE MRSA (MISCELLANEOUS) ×2 IMPLANT
SYR CONTROL 10ML LL (SYRINGE) ×2 IMPLANT
TOWEL OR 17X24 6PK STRL BLUE (TOWEL DISPOSABLE) ×4 IMPLANT
TOWEL OR 17X26 10 PK STRL BLUE (TOWEL DISPOSABLE) ×4 IMPLANT
TUBE ANAEROBIC SPECIMEN COL (MISCELLANEOUS) ×2 IMPLANT

## 2014-06-30 NOTE — Brief Op Note (Signed)
06/30/2014  6:03 PM  PATIENT:  [name]  45 y.o. male  PRE-OPERATIVE DIAGNOSIS:  LEFT ELBOW  PAINFUL OLECRANON BURSITIS, INFECTION  POST-OPERATIVE DIAGNOSIS:  * No post-op diagnosis entered *  PROCEDURE:  Procedure(s): HARDWARE REMOVAL (Left)  SURGEON:  Surgeon(s) and Role:    * Dorna Leitz, MD - Primary  PHYSICIAN ASSISTANT:   ASSISTANTS: bethune   ANESTHESIA:   general  EBL:     BLOOD ADMINISTERED:none  DRAINS: none   LOCAL MEDICATIONS USED:  NONE  SPECIMEN:  No Specimen  DISPOSITION OF SPECIMEN:  N/A  COUNTS:  YES  TOURNIQUET:    DICTATION: .Other Dictation: Dictation Number [id number]  PLAN OF CARE: Admit to inpatient   PATIENT DISPOSITION:  PACU - hemodynamically stable.   Delay start of Pharmacological VTE agent (>24hrs) due to surgical blood loss or risk of bleeding: no

## 2014-06-30 NOTE — Anesthesia Preprocedure Evaluation (Signed)
Anesthesia Evaluation  Patient identified by MRN, date of birth, ID band Patient awake    Reviewed: Allergy & Precautions, NPO status , Patient's Chart, lab work & pertinent test results  Airway Mallampati: II  TM Distance: >3 FB Neck ROM: Full    Dental no notable dental hx.    Pulmonary shortness of breath, former smoker,  breath sounds clear to auscultation  Pulmonary exam normal       Cardiovascular hypertension, negative cardio ROS Normal cardiovascular examRhythm:Regular Rate:Normal     Neuro/Psych negative neurological ROS  negative psych ROS   GI/Hepatic Neg liver ROS, GERD-  ,  Endo/Other  diabetes, Type 2, Oral Hypoglycemic Agents  Renal/GU negative Renal ROS     Musculoskeletal negative musculoskeletal ROS (+)   Abdominal   Peds  Hematology negative hematology ROS (+)   Anesthesia Other Findings   Reproductive/Obstetrics negative OB ROS                             Anesthesia Physical Anesthesia Plan  ASA: III  Anesthesia Plan: General   Post-op Pain Management:    Induction: Intravenous  Airway Management Planned: LMA  Additional Equipment: None  Intra-op Plan:   Post-operative Plan: Extubation in OR  Informed Consent: I have reviewed the patients History and Physical, chart, labs and discussed the procedure including the risks, benefits and alternatives for the proposed anesthesia with the patient or authorized representative who has indicated his/her understanding and acceptance.   Dental advisory given  Plan Discussed with: CRNA  Anesthesia Plan Comments:         Anesthesia Quick Evaluation

## 2014-06-30 NOTE — Anesthesia Procedure Notes (Signed)
Procedure Name: LMA Insertion Date/Time: 06/30/2014 5:33 PM Performed by: Melina Copa, Yeny Schmoll R Pre-anesthesia Checklist: Patient identified Patient Re-evaluated:Patient Re-evaluated prior to inductionOxygen Delivery Method: Circle system utilized Preoxygenation: Pre-oxygenation with 100% oxygen Intubation Type: IV induction Ventilation: Mask ventilation without difficulty LMA: LMA inserted LMA Size: 5.0 Number of attempts: 1 Placement Confirmation: positive ETCO2 Tube secured with: Tape Dental Injury: Teeth and Oropharynx as per pre-operative assessment

## 2014-06-30 NOTE — H&P (Signed)
PREOPERATIVE H&P  Chief Complaint: l elbow pain with retained hardware  HPI: [name] is a 44 y.o. male who presents for evaluation of l elbow pain with retained hardware. It has been present for several days and has been worsening. He has failed conservative measures. Pain is rated as moderate.pt presentes to ER for the 4th time in 3 years with pain and warmth over l elbow hardwarea.  He c/o severe pain with light touch and rom.  Past Medical History  Diagnosis Date  . History of Hodgkin's disease   . ED (erectile dysfunction)   . Sarcoid   . Hypertension   . Hyperlipidemia   . GERD (gastroesophageal reflux disease)    Past Surgical History  Procedure Laterality Date  . Lung biopsy    . Arm surgery Left 2001  . Hernia repair    . Tonsillectomy     History   Social History  . Marital Status: Married    Spouse Name: N/A  . Number of Children: N/A  . Years of Education: N/A   Social History Main Topics  . Smoking status: Former Smoker    Types: Cigarettes  . Smokeless tobacco: Never Used     Comment: stop smoking cigarettes in 2015  . Alcohol Use: Yes     Comment: rare  . Drug Use: Yes    Special: Marijuana     Comment: occasional  . Sexual Activity: Not on file   Other Topics Concern  . None   Social History Narrative   Family History  Problem Relation Age of Onset  . Hypertension Mother   . Hypertension Father   . Cancer Other   . Diabetes Brother    No Known Allergies Prior to Admission medications   Medication Sig Start Date End Date Taking? Authorizing Provider  acetaminophen (TYLENOL) 500 MG tablet Take 500 mg by mouth every 6 (six) hours as needed for mild pain or fever (pain).    Yes Historical Provider, MD  cephALEXin (KEFLEX) 500 MG capsule Take 1 capsule (500 mg total) by mouth 4 (four) times daily. 06/27/14  Yes Veryl Speak, MD  HYDROcodone-acetaminophen (NORCO/VICODIN) 5-325 MG per tablet Take 1-2 tablets by mouth every 6 (six) hours as  needed. 04/26/14  Yes Montine Circle, PA-C  oxyCODONE-acetaminophen (PERCOCET) 5-325 MG per tablet Take 1-2 tablets by mouth every 6 (six) hours as needed for moderate pain. 06/23/14  Yes [name], MD  oxyCODONE-acetaminophen (PERCOCET/ROXICET) 5-325 MG per tablet Take 1 tablet by mouth every 4 (four) hours as needed. 01/06/13  Yes Kristen N Ward, DO  ibuprofen (ADVIL,MOTRIN) 200 MG tablet Take 400 mg by mouth every 6 (six) hours as needed for moderate pain (pain).    Historical Provider, MD  ondansetron (ZOFRAN) 4 MG tablet Take 1 tablet (4 mg total) by mouth every 6 (six) hours. Patient not taking: Reported on 06/27/2014 04/26/14   Montine Circle, PA-C     Positive ROS: none  All other systems have been reviewed and were otherwise negative with the exception of those mentioned in the HPI and as above.  Physical Exam: Filed Vitals:   06/30/14 1414  BP: 154/102  Pulse: 68  Temp: 98.9 F (37.2 C)  Resp: 18    General: Alert, no acute distress Cardiovascular: No pedal edema Respiratory: No cyanosis, no use of accessory musculature GI: No organomegaly, abdomen is soft and non-tender Skin: No lesions in the area of chief complaint Neurologic: Sensation intact distally Psychiatric: Patient is competent for  consent with normal mood and affect Lymphatic: No axillary or cervical lymphadenopathy  MUSCULOSKELETAL: l elbow: painful rom limited rom mild warmth// no erythema Recent Results (from the past 2160 hour(s))  CBC with Differential     Status: Abnormal   Collection Time: 04/26/14 11:00 AM  Result Value Ref Range   WBC 4.4 4.0 - 10.5 K/uL   RBC 4.56 4.22 - 5.81 MIL/uL   Hemoglobin 14.3 13.0 - 17.0 g/dL   HCT 40.9 39.0 - 52.0 %   MCV 89.7 78.0 - 100.0 fL   MCH 31.4 26.0 - 34.0 pg   MCHC 35.0 30.0 - 36.0 g/dL   RDW 13.6 11.5 - 15.5 %   Platelets 155 150 - 400 K/uL   Neutrophils Relative % 58 43 - 77 %   Neutro Abs 2.6 1.7 - 7.7 K/uL   Lymphocytes Relative 24 12 - 46 %    Lymphs Abs 1.0 0.7 - 4.0 K/uL   Monocytes Relative 18 (H) 3 - 12 %   Monocytes Absolute 0.8 0.1 - 1.0 K/uL   Eosinophils Relative 0 0 - 5 %   Eosinophils Absolute 0.0 0.0 - 0.7 K/uL   Basophils Relative 0 0 - 1 %   Basophils Absolute 0.0 0.0 - 0.1 K/uL  Comprehensive metabolic panel     Status: Abnormal   Collection Time: 04/26/14 11:00 AM  Result Value Ref Range   Sodium 139 135 - 145 mmol/L   Potassium 3.4 (L) 3.5 - 5.1 mmol/L   Chloride 102 96 - 112 mmol/L   CO2 24 19 - 32 mmol/L   Glucose, Bld 105 (H) 70 - 99 mg/dL   BUN 12 6 - 23 mg/dL   Creatinine, Ser 1.31 0.50 - 1.35 mg/dL   Calcium 9.3 8.4 - 10.5 mg/dL   Total Protein 7.5 6.0 - 8.3 g/dL   Albumin 4.2 3.5 - 5.2 g/dL   AST 39 (H) 0 - 37 U/L   ALT 19 0 - 53 U/L   Alkaline Phosphatase 91 39 - 117 U/L   Total Bilirubin 1.3 (H) 0.3 - 1.2 mg/dL   GFR calc non Af Amer 66 (L) >90 mL/min   GFR calc Af Amer 76 (L) >90 mL/min    Comment: (NOTE) The eGFR has been calculated using the CKD EPI equation. This calculation has not been validated in all clinical situations. eGFR's persistently <90 mL/min signify possible Chronic Kidney Disease.    Anion gap 13 5 - 15  Lipase, blood     Status: None   Collection Time: 04/26/14 11:05 AM  Result Value Ref Range   Lipase 37 11 - 59 U/L  CBC with Differential     Status: Abnormal   Collection Time: 06/22/14  9:31 PM  Result Value Ref Range   WBC 10.6 (H) 4.0 - 10.5 K/uL   RBC 4.45 4.22 - 5.81 MIL/uL   Hemoglobin 13.6 13.0 - 17.0 g/dL   HCT 39.2 39.0 - 52.0 %   MCV 88.1 78.0 - 100.0 fL   MCH 30.6 26.0 - 34.0 pg   MCHC 34.7 30.0 - 36.0 g/dL   RDW 13.9 11.5 - 15.5 %   Platelets 233 150 - 400 K/uL   Neutrophils Relative % 70 43 - 77 %   Neutro Abs 7.4 1.7 - 7.7 K/uL   Lymphocytes Relative 20 12 - 46 %   Lymphs Abs 2.2 0.7 - 4.0 K/uL   Monocytes Relative 9 3 - 12 %   Monocytes Absolute  1.0 0.1 - 1.0 K/uL   Eosinophils Relative 1 0 - 5 %   Eosinophils Absolute 0.1 0.0 - 0.7 K/uL    Basophils Relative 0 0 - 1 %   Basophils Absolute 0.0 0.0 - 0.1 K/uL  Basic metabolic panel     Status: Abnormal   Collection Time: 06/22/14  9:31 PM  Result Value Ref Range   Sodium 140 135 - 145 mmol/L   Potassium 3.6 3.5 - 5.1 mmol/L   Chloride 107 101 - 111 mmol/L   CO2 25 22 - 32 mmol/L   Glucose, Bld 102 (H) 65 - 99 mg/dL   BUN 14 6 - 20 mg/dL   Creatinine, Ser 1.07 0.61 - 1.24 mg/dL   Calcium 9.2 8.9 - 10.3 mg/dL   GFR calc non Af Amer >60 >60 mL/min   GFR calc Af Amer >60 >60 mL/min    Comment: (NOTE) The eGFR has been calculated using the CKD EPI equation. This calculation has not been validated in all clinical situations. eGFR's persistently <60 mL/min signify possible Chronic Kidney Disease.    Anion gap 8 5 - 15  CBC with Differential     Status: Abnormal   Collection Time: 06/27/14  8:27 PM  Result Value Ref Range   WBC 8.8 4.0 - 10.5 K/uL   RBC 4.02 (L) 4.22 - 5.81 MIL/uL   Hemoglobin 12.3 (L) 13.0 - 17.0 g/dL   HCT 36.3 (L) 39.0 - 52.0 %   MCV 90.3 78.0 - 100.0 fL   MCH 30.6 26.0 - 34.0 pg   MCHC 33.9 30.0 - 36.0 g/dL   RDW 13.7 11.5 - 15.5 %   Platelets 275 150 - 400 K/uL   Neutrophils Relative % 71 43 - 77 %   Neutro Abs 6.2 1.7 - 7.7 K/uL   Lymphocytes Relative 19 12 - 46 %   Lymphs Abs 1.7 0.7 - 4.0 K/uL   Monocytes Relative 9 3 - 12 %   Monocytes Absolute 0.8 0.1 - 1.0 K/uL   Eosinophils Relative 1 0 - 5 %   Eosinophils Absolute 0.1 0.0 - 0.7 K/uL   Basophils Relative 0 0 - 1 %   Basophils Absolute 0.0 0.0 - 0.1 K/uL  Basic metabolic panel     Status: None   Collection Time: 06/27/14  8:27 PM  Result Value Ref Range   Sodium 142 135 - 145 mmol/L   Potassium 3.6 3.5 - 5.1 mmol/L   Chloride 106 101 - 111 mmol/L   CO2 28 22 - 32 mmol/L   Glucose, Bld 83 65 - 99 mg/dL   BUN 14 6 - 20 mg/dL   Creatinine, Ser 0.94 0.61 - 1.24 mg/dL   Calcium 9.1 8.9 - 10.3 mg/dL   GFR calc non Af Amer >60 >60 mL/min   GFR calc Af Amer >60 >60 mL/min    Comment:  (NOTE) The eGFR has been calculated using the CKD EPI equation. This calculation has not been validated in all clinical situations. eGFR's persistently <60 mL/min signify possible Chronic Kidney Disease.    Anion gap 8 5 - 15  Sedimentation rate     Status: Abnormal   Collection Time: 06/27/14  8:27 PM  Result Value Ref Range   Sed Rate 48 (H) 0 - 16 mm/hr  C-reactive protein     Status: Abnormal   Collection Time: 06/27/14  8:27 PM  Result Value Ref Range   CRP 7.2 (H) <1.0 mg/dL    Comment:  Performed at [facility]   Assessment/Plan: LEFT ELBOW  PAINFUL OLECRANON BURSITIS, INFECTION Plan for Procedure(s): HARDWARE REMOVAL  The risks benefits and alternatives were discussed with the patient including but not limited to the risks of nonoperative treatment, versus surgical intervention including infection, bleeding, nerve injury, malunion, nonunion, hardware prominence, hardware failure, need for hardware removal, blood clots, cardiopulmonary complications, morbidity, mortality, among others, and they were willing to proceed.  Predicted outcome is good, although there will be at least a six to nine month expected recovery.  Alta Corning, MD 06/30/2014 4:48 PM

## 2014-06-30 NOTE — Transfer of Care (Signed)
Immediate Anesthesia Transfer of Care Note  Patient: [name]  Procedure(s) Performed: Procedure(s): HARDWARE REMOVAL, left elbow (Left) INCISION AND DRAINAGE ABSCESS, left elbow (Left)  Patient Location: PACU  Anesthesia Type:General  Level of Consciousness: awake  Airway & Oxygen Therapy: Patient Spontanous Breathing  Post-op Assessment: Report given to RN, Post -op Vital signs reviewed and stable and Patient moving all extremities  Post vital signs: Reviewed and stable  Last Vitals:  Filed Vitals:   06/30/14 1414  BP: 154/102  Pulse: 68  Temp: 37.2 C  Resp: 18    Complications: No apparent anesthesia complications

## 2014-06-30 NOTE — Anesthesia Postprocedure Evaluation (Signed)
Anesthesia Post Note  Patient: [name]  Procedure(s) Performed: Procedure(s) (LRB): HARDWARE REMOVAL, left elbow (Left) INCISION AND DRAINAGE ABSCESS, left elbow (Left)  Anesthesia type: general  Patient location: PACU  Post pain: Pain level controlled  Post assessment: Patient's Cardiovascular Status Stable  Last Vitals:  Filed Vitals:   06/30/14 2015  BP:   Pulse: 68  Temp:   Resp: 17    Post vital signs: Reviewed and stable  Level of consciousness: sedated  Complications: No apparent anesthesia complications

## 2014-07-01 ENCOUNTER — Encounter (HOSPITAL_COMMUNITY): Payer: Self-pay | Admitting: Orthopedic Surgery

## 2014-07-01 LAB — SEDIMENTATION RATE: Sed Rate: 12 mm/hr (ref 0–16)

## 2014-07-01 MED ORDER — OXYCODONE-ACETAMINOPHEN 5-325 MG PO TABS: 1.0000 | ORAL_TABLET | Freq: Four times a day (QID) | ORAL | Status: DC | PRN

## 2014-07-01 MED ORDER — CETYLPYRIDINIUM CHLORIDE 0.05 % MT LIQD
7.0000 mL | Freq: Two times a day (BID) | OROMUCOSAL | Status: DC
  Administered 2014-07-01: 7 mL via OROMUCOSAL

## 2014-07-01 NOTE — Progress Notes (Signed)
Pt and wife recieved discharge and education orders. All questions addressed, vital stable. Left arm in surgical wrap and sling, C/D/I. IV dc'd. Discarged to home With Rx's. 07/01/2014 4:44 PM Brent Taillon

## 2014-07-01 NOTE — Op Note (Signed)
[name], [name]             ACCOUNT NO.:  [id number]  MEDICAL RECORD NO.:  [id number]  LOCATION:  [id number]                        FACILITY:  [facility]  PHYSICIAN:  Alta Corning, M.D.   DATE OF BIRTH:  04/10/1970  DATE OF PROCEDURE:  06/30/2014 DATE OF DISCHARGE:                              OPERATIVE REPORT   PREOPERATIVE DIAGNOSIS:  Painful hardware with concern for infection.  POSTOPERATIVE DIAGNOSIS:  Infected hardware, left elbow.  PROCEDURE: 1. Removal of infected hardware, left elbow. 2. Olecranon bursectomy, left elbow.  SURGEON:  Alta Corning, M.D.  ASSISTANT:  Gary Fleet, PA  ANESTHESIA:  General.  BRIEF HISTORY:  [name] is a 44 year old male with a history of having significant complaints of left elbow pain.  He had a previous ORIF of olecranon fracture and he had painful hardware in this area.  He had multiple admissions to the emergency room with pain, inflammation, and after failure of all conservative care, he was evaluated in my office, he was noted to be severely inflamed, difficult time even touching the elbow.  Because of pain and concern for infection, he is taken to the operating room for hardware removal and other as-needed procedure.  DESCRIPTION OF PROCEDURE:  The patient was taken to the operating room. After adequate anesthesia was obtained with general anesthetic, the patient was placed supine on the operating table.  The left arm was prepped and draped in usual sterile fashion.  In the prepping process, he started draining pus from the central part of the wound, this was cultured and after elevation of the extremity and after routine prep and drape, blood pressure tourniquet inflated to 250 mmHg.  Following this, the old scar was excised and we dissected down to the hardware, made flaps, there was sort of pus-type material and oozing kind of all around in through here, nothing dramatic, but certainly was there.  We untwisted the  hardware, cut off the ends, pulled the hardware through bone, irrigated it thoroughly with the pulsatile lavage irrigation, removed the 2 long pins, which actually came out pretty easily with little bit of blush of pus with each of those.  Once it was thoroughly and copiously irrigated, the wound was closed in layers and staples were used on the skin ultimately.  Sterile compressive dressing was applied as well as posterior splint and the patient was taken to the recovery room and was noted to be in a satisfactory condition. Estimated blood loss for the procedure was minimal.     Alta Corning, M.D.     Corliss Skains  D:  06/30/2014  T:  07/01/2014  Job:  [id number]  cc:   Alta Corning, M.D.

## 2014-07-01 NOTE — Progress Notes (Addendum)
Subjective: 1 Day Post-Op Procedure(s) (LRB): HARDWARE REMOVAL, left elbow (Left) INCISION AND DRAINAGE ABSCESS, left elbow (Left) Patient reports pain as moderate. The patient did have some nausea this a.m.   Objective: Vital signs in last 24 hours: Temp:  [97.8 F (36.6 C)-98.9 F (37.2 C)] 97.9 F (36.6 C) (06/09 0555) Pulse Rate:  [55-86] 62 (06/09 0555) Resp:  [9-33] 20 (06/09 0555) BP: (131-186)/(82-113) 131/88 mmHg (06/09 0555) SpO2:  [95 %-100 %] 97 % (06/09 0555) Weight:  [72.576 kg (160 lb)] 72.576 kg (160 lb) (06/08 1414)  Intake/Output from previous day: 06/08 0701 - 06/09 0700 In: 2882.5 [P.O.:220; I.V.:2562.5; IV Piggyback:100] Out: 575 [Urine:575] Intake/Output this shift: Total I/O In: 444 [P.O.:444] Out: 250 [Urine:250] Left elbow exam: Long-arm posterior splint is intact. Dressing is clean and dry. Moves fingers actively. Normal sensation and motor strength in fingers.  Cultures pending. The patient just had a small 1 x 1 cm.purulent abscess over the twisted wire in his elbow. The infection did not appear to go deep into the elbow joint. Assessment/Plan: 1 Day Post-Op Procedure(s) (LRB): HARDWARE REMOVAL, left elbow (Left) INCISION AND DRAINAGE ABSCESS, left elbow (Left) Plan: Will get infectious disease consult for recommendations on antibiotics and length of therapy. Continue on IV Ancef in the meantime. Hopefully he can be discharged home today. I have written Rx for pain medication. If discharged today will need follow-up with Dr. Berenice Primas in 10 days. Addendum: We spoke with  Infectious disease.  They suggested oral antibiotics.  We will discharge the patient home today.I will order a sedimentation rate prior to discharge [place] 07/01/2014, 11:10 AM

## 2014-07-01 NOTE — Discharge Summary (Signed)
Patient ID: [name] MRN: [id number] DOB/AGE: 1970-06-20 44 y.o.  Admit date: 06/30/2014 Discharge date: 07/01/2014  Admission Diagnoses:  Principal Problem:   Foreign body of elbow with infection Active Problems:   Presence of retained hardware   Infection of elbow   Discharge Diagnoses:  Same  Past Medical History  Diagnosis Date  . History of Hodgkin's disease   . ED (erectile dysfunction)   . Sarcoid   . Hypertension   . Hyperlipidemia   . GERD (gastroesophageal reflux disease)     Surgeries: Procedure(s): HARDWARE REMOVAL, left elbow INCISION AND DRAINAGE ABSCESS, left elbow on 06/30/2014   Consultants:    Discharged Condition: Improved  Hospital Course: [name] is an 44 y.o. male who was admitted 06/30/2014 for operative treatment ofForeign body of elbow with infection. Patient has severe unremitting pain that affects sleep, daily activities, and work/hobbies. After pre-op clearance the patient was taken to the operating room on 06/30/2014 and underwent  Procedure(s): HARDWARE REMOVAL, left elbow INCISION AND DRAINAGE ABSCESS, left elbow.    Patient was given perioperative antibiotics: Anti-infectives    Start     Dose/Rate Route Frequency Ordered Stop   07/01/14 0000  ceFAZolin (ANCEF) IVPB 2 g/50 mL premix     2 g 100 mL/hr over 30 Minutes Intravenous 3 times per day 06/30/14 2217         The patient was  Treated with IV Ancef x24 hours.  He was doing well overall.  He was afebrile  And his vital signs were stable.  Patient benefited maximally from hospital stay and there were no complications.    Recent vital signs: Patient Vitals for the past 24 hrs:  BP Temp Temp src Pulse Resp SpO2 Height Weight  07/01/14 0555 131/88 mmHg 97.9 F (36.6 C) Oral 62 20 97 % - -  07/01/14 0343 (!) 151/89 mmHg 98.3 F (36.8 C) Oral 68 20 100 % - -  07/01/14 0026 (!) 149/82 mmHg 97.8 F (36.6 C) Oral 61 16 100 % - -  06/30/14 2235 (!) 144/83 mmHg 98 F (36.7  C) Oral 60 18 96 % - -  06/30/14 2145 - - - 67 (!) 24 100 % - -  06/30/14 2131 (!) 142/82 mmHg - - 70 16 97 % - -  06/30/14 2130 - - - 70 13 98 % - -  06/30/14 2125 - 97.8 F (36.6 C) - 60 14 96 % - -  06/30/14 2115 - - - 67 19 98 % - -  06/30/14 2103 - - - 61 12 100 % - -  06/30/14 2100 - - - 61 (!) 9 100 % - -  06/30/14 2054 - - - 74 15 100 % - -  06/30/14 2048 (!) 172/95 mmHg - - 60 16 100 % - -  06/30/14 2045 - - - 66 15 100 % - -  06/30/14 2033 (!) 178/113 mmHg - - (!) 58 16 100 % - -  06/30/14 2030 - - - 60 15 100 % - -  06/30/14 2015 - - - 68 17 100 % - -  06/30/14 2012 - - - 63 14 100 % - -  06/30/14 2004 - - - 61 15 100 % - -  06/30/14 2003 - - - 62 14 100 % - -  06/30/14 2002 (!) 183/98 mmHg - - 62 13 100 % - -  06/30/14 2000 - - - (!) 57 14 100 % - -  06/30/14 1945 (!) 169/92 mmHg - - 60 16 100 % - -  06/30/14 1933 (!) 182/87 mmHg - - (!) 59 20 100 % - -  06/30/14 1930 - - - 61 16 100 % - -  06/30/14 1928 - - - (!) 55 10 100 % - -  06/30/14 1915 (!) 180/92 mmHg - - (!) 59 14 100 % - -  06/30/14 1910 - - - 68 (!) 26 100 % - -  06/30/14 1906 - - - 69 19 96 % - -  06/30/14 1903 (!) 182/100 mmHg - - (!) 58 11 100 % - -  06/30/14 1900 - - - 83 16 99 % - -  06/30/14 1855 - - - 68 (!) 33 98 % - -  06/30/14 1850 (!) 172/92 mmHg - - 65 11 95 % - -  06/30/14 1845 (!) 186/99 mmHg - - 75 13 100 % - -  06/30/14 1844 - - - 81 19 99 % - -  06/30/14 1833 (!) 175/97 mmHg 98 F (36.7 C) - 86 10 97 % - -  06/30/14 1414 (!) 154/102 mmHg 98.9 F (37.2 C) Oral 68 18 100 % 5\' 5"  (1.651 m) 72.576 kg (160 lb)     Recent laboratory studies:   Discharge Medications:     Medication List    STOP taking these medications        HYDROcodone-acetaminophen 5-325 MG per tablet  Commonly known as:  NORCO/VICODIN     ondansetron 4 MG tablet  Commonly known as:  ZOFRAN      TAKE these medications        acetaminophen 500 MG tablet  Commonly known as:  TYLENOL  Take 500 mg by mouth  every 6 (six) hours as needed for mild pain or fever (pain).     cephALEXin 500 MG capsule  Commonly known as:  KEFLEX  Take 1 capsule (500 mg total) by mouth 4 (four) times daily.     ibuprofen 200 MG tablet  Commonly known as:  ADVIL,MOTRIN  Take 400 mg by mouth every 6 (six) hours as needed for moderate pain (pain).     oxyCODONE-acetaminophen 5-325 MG per tablet  Commonly known as:  PERCOCET  Take 1-2 tablets by mouth every 6 (six) hours as needed for severe pain.        Diagnostic Studies: Dg Elbow Complete Left  05-Jul-2014   CLINICAL DATA:  Remote ORIF, open wound from 2013. Closed arm in door tonight.  EXAM: LEFT ELBOW - COMPLETE 3+ VIEW; LEFT FOREARM - 2 VIEW  COMPARISON:  LEFT elbow January 06, 2013  FINDINGS: No acute fracture deformity. No dislocation. Two intact olecranon nails, the proximal aspect is extraosseous, unchanged. Mild periprosthetic lucency is similar. Intact olecranon cerclage wires. No destructive bony lesions. Posterior elbow soft tissue swelling without subcutaneous gas or radiopaque foreign bodies.  IMPRESSION: No acute fracture deformity or dislocation.  Olecranon ORIF, with similar mild periprosthetic lucency at the distal nail.  Posterior elbow soft tissue swelling could reflect bursitis.   Electronically Signed   By: Elon Alas M.D.   On: 07-05-14 00:54   Dg Forearm Left  07-05-2014   CLINICAL DATA:  Remote ORIF, open wound from 2013. Closed arm in door tonight.  EXAM: LEFT ELBOW - COMPLETE 3+ VIEW; LEFT FOREARM - 2 VIEW  COMPARISON:  LEFT elbow January 06, 2013  FINDINGS: No acute fracture deformity. No dislocation. Two intact olecranon nails, the proximal aspect is  extraosseous, unchanged. Mild periprosthetic lucency is similar. Intact olecranon cerclage wires. No destructive bony lesions. Posterior elbow soft tissue swelling without subcutaneous gas or radiopaque foreign bodies.  IMPRESSION: No acute fracture deformity or dislocation.  Olecranon  ORIF, with similar mild periprosthetic lucency at the distal nail.  Posterior elbow soft tissue swelling could reflect bursitis.   Electronically Signed   By: Elon Alas M.D.   On: 06/23/2014 00:54    Disposition: 01-Home or Self Care        Follow-up Information    Follow up with GRAVES,JOHN L, MD. Schedule an appointment as soon as possible for a visit in 10 days.   Specialty:  Orthopedic Surgery   Contact information:   [place] [address] [phone]        Signed: Erlene Senters 07/01/2014, 1:16 PM

## 2014-07-01 NOTE — Progress Notes (Signed)
Utilization Review completed. Kamuela Magos RN BSN CM 

## 2014-07-03 LAB — CULTURE, ROUTINE-ABSCESS

## 2014-07-05 LAB — ANAEROBIC CULTURE

## 2014-11-29 ENCOUNTER — Emergency Department (HOSPITAL_COMMUNITY): Payer: Medicare Other

## 2014-11-29 ENCOUNTER — Emergency Department (HOSPITAL_COMMUNITY)
Admission: EM | Admit: 2014-11-29 | Discharge: 2014-11-29 | Disposition: A | Discharge status: Home / Self Care | Payer: Medicare Other | Attending: Emergency Medicine | Admitting: Emergency Medicine

## 2014-11-29 ENCOUNTER — Encounter (HOSPITAL_COMMUNITY): Payer: Self-pay | Admitting: Emergency Medicine

## 2014-11-29 DIAGNOSIS — I10 Essential (primary) hypertension: Secondary | ICD-10-CM | POA: Insufficient documentation

## 2014-11-29 DIAGNOSIS — K859 Acute pancreatitis without necrosis or infection, unspecified: Secondary | ICD-10-CM | POA: Insufficient documentation

## 2014-11-29 DIAGNOSIS — Z87891 Personal history of nicotine dependence: Secondary | ICD-10-CM | POA: Diagnosis not present

## 2014-11-29 DIAGNOSIS — Z862 Personal history of diseases of the blood and blood-forming organs and certain disorders involving the immune mechanism: Secondary | ICD-10-CM | POA: Diagnosis not present

## 2014-11-29 DIAGNOSIS — Z8571 Personal history of Hodgkin lymphoma: Secondary | ICD-10-CM | POA: Diagnosis not present

## 2014-11-29 DIAGNOSIS — Z87448 Personal history of other diseases of urinary system: Secondary | ICD-10-CM | POA: Diagnosis not present

## 2014-11-29 DIAGNOSIS — Z8639 Personal history of other endocrine, nutritional and metabolic disease: Secondary | ICD-10-CM | POA: Diagnosis not present

## 2014-11-29 DIAGNOSIS — R109 Unspecified abdominal pain: Secondary | ICD-10-CM | POA: Diagnosis present

## 2014-11-29 DIAGNOSIS — R61 Generalized hyperhidrosis: Secondary | ICD-10-CM | POA: Insufficient documentation

## 2014-11-29 DIAGNOSIS — K219 Gastro-esophageal reflux disease without esophagitis: Secondary | ICD-10-CM | POA: Diagnosis not present

## 2014-11-29 DIAGNOSIS — R112 Nausea with vomiting, unspecified: Secondary | ICD-10-CM

## 2014-11-29 DIAGNOSIS — R197 Diarrhea, unspecified: Secondary | ICD-10-CM | POA: Insufficient documentation

## 2014-11-29 LAB — CBC WITH DIFFERENTIAL/PLATELET
BASOS PCT: 0 %
Basophils Absolute: 0 10*3/uL (ref 0.0–0.1)
EOS PCT: 0 %
Eosinophils Absolute: 0 10*3/uL (ref 0.0–0.7)
HCT: 45.5 % (ref 39.0–52.0)
HEMOGLOBIN: 15.2 g/dL (ref 13.0–17.0)
Lymphocytes Relative: 12 %
Lymphs Abs: 1.6 10*3/uL (ref 0.7–4.0)
MCH: 31 pg (ref 26.0–34.0)
MCHC: 33.4 g/dL (ref 30.0–36.0)
MCV: 92.7 fL (ref 78.0–100.0)
Monocytes Absolute: 0.8 10*3/uL (ref 0.1–1.0)
Monocytes Relative: 6 %
NEUTROS PCT: 82 %
Neutro Abs: 10.8 10*3/uL — ABNORMAL HIGH (ref 1.7–7.7)
Platelets: 235 10*3/uL (ref 150–400)
RBC: 4.91 MIL/uL (ref 4.22–5.81)
RDW: 13.7 % (ref 11.5–15.5)
WBC: 13.3 10*3/uL — AB (ref 4.0–10.5)

## 2014-11-29 LAB — LIPASE, BLOOD: Lipase: 163 U/L — ABNORMAL HIGH (ref 11–51)

## 2014-11-29 LAB — COMPREHENSIVE METABOLIC PANEL
ALT: 18 U/L (ref 17–63)
ANION GAP: 8 (ref 5–15)
AST: 28 U/L (ref 15–41)
Albumin: 4.9 g/dL (ref 3.5–5.0)
Alkaline Phosphatase: 105 U/L (ref 38–126)
BUN: 23 mg/dL — ABNORMAL HIGH (ref 6–20)
CHLORIDE: 109 mmol/L (ref 101–111)
CO2: 26 mmol/L (ref 22–32)
Calcium: 9.9 mg/dL (ref 8.9–10.3)
Creatinine, Ser: 1.17 mg/dL (ref 0.61–1.24)
Glucose, Bld: 135 mg/dL — ABNORMAL HIGH (ref 65–99)
POTASSIUM: 3.8 mmol/L (ref 3.5–5.1)
SODIUM: 143 mmol/L (ref 135–145)
TOTAL PROTEIN: 8.1 g/dL (ref 6.5–8.1)
Total Bilirubin: 1.1 mg/dL (ref 0.3–1.2)

## 2014-11-29 LAB — URINALYSIS, ROUTINE W REFLEX MICROSCOPIC
BILIRUBIN URINE: NEGATIVE
GLUCOSE, UA: NEGATIVE mg/dL
Hgb urine dipstick: NEGATIVE
Ketones, ur: NEGATIVE mg/dL
Leukocytes, UA: NEGATIVE
Nitrite: NEGATIVE
PROTEIN: NEGATIVE mg/dL
Specific Gravity, Urine: 1.022 (ref 1.005–1.030)
UROBILINOGEN UA: 0.2 mg/dL (ref 0.0–1.0)
pH: 5.5 (ref 5.0–8.0)

## 2014-11-29 MED ORDER — ONDANSETRON HCL 4 MG/2ML IJ SOLN
4.0000 mg | Freq: Once | INTRAMUSCULAR | Status: AC
  Administered 2014-11-29: 4 mg via INTRAVENOUS
  Filled 2014-11-29: qty 2

## 2014-11-29 MED ORDER — PROMETHAZINE HCL 25 MG PO TABS: 25.0000 mg | ORAL_TABLET | Freq: Four times a day (QID) | ORAL | Status: DC | PRN

## 2014-11-29 MED ORDER — SODIUM CHLORIDE 0.9 % IV BOLUS (SEPSIS)
1000.0000 mL | Freq: Once | INTRAVENOUS | Status: AC
  Administered 2014-11-29: 1000 mL via INTRAVENOUS

## 2014-11-29 MED ORDER — PROMETHAZINE HCL 25 MG/ML IJ SOLN
12.5000 mg | Freq: Once | INTRAMUSCULAR | Status: AC
  Administered 2014-11-29: 12.5 mg via INTRAVENOUS
  Filled 2014-11-29: qty 1

## 2014-11-29 MED ORDER — HYDROMORPHONE HCL 1 MG/ML IJ SOLN
0.5000 mg | Freq: Once | INTRAMUSCULAR | Status: AC
  Administered 2014-11-29: 0.5 mg via INTRAVENOUS
  Filled 2014-11-29: qty 1

## 2014-11-29 MED ORDER — IOHEXOL 300 MG/ML  SOLN
50.0000 mL | Freq: Once | INTRAMUSCULAR | Status: DC | PRN
  Administered 2014-11-29: 50 mL via ORAL
  Filled 2014-11-29: qty 50

## 2014-11-29 MED ORDER — IOHEXOL 300 MG/ML  SOLN
100.0000 mL | Freq: Once | INTRAMUSCULAR | Status: AC | PRN
  Administered 2014-11-29: 100 mL via INTRAVENOUS

## 2014-11-29 MED ORDER — METOCLOPRAMIDE HCL 5 MG/ML IJ SOLN
10.0000 mg | Freq: Once | INTRAMUSCULAR | Status: AC
  Administered 2014-11-29: 10 mg via INTRAVENOUS
  Filled 2014-11-29: qty 2

## 2014-11-29 MED ORDER — HYDROCODONE-ACETAMINOPHEN 5-325 MG PO TABS: 1.0000 | ORAL_TABLET | Freq: Four times a day (QID) | ORAL | Status: DC | PRN

## 2014-11-29 NOTE — ED Notes (Signed)
Discharge instructions and rx x2 reviewed with patient. Patient verbalized understanding.

## 2014-11-29 NOTE — ED Provider Notes (Signed)
CSN: [id number]     Arrival date & time 11/29/14  0912 History   First MD Initiated Contact with Patient 11/29/14 [phone]     Chief Complaint  Patient presents with  . abdominal pain/vomiting      (Consider location/radiation/quality/duration/timing/severity/associated sxs/prior Treatment) HPI [name] is a 44 y.o. male with history of Hodgkin's disease, sarcoid, hypertension, GERD, presents to emergency department complaining of nausea, vomiting, abdominal pain, diarrhea. Patient states his symptoms started yesterday, states yesterday symptoms were mild. States this morning woke with persistent vomiting, states more than 10 episodes of yellow emesis. Reports multiple episodes of watery diarrhea as well. He reports pain is in the left lower quadrant. He states he thinks he may have seen some blood in his stool, denies any blood in his emesis. Denies history of the same. He denies any new medications or alcohol. He does smoke marijuana, last smoked yesterday. He reports frequent but not daily use. He denies any prior abdominal surgeries. Denies any fever or chills. No one in family sick with the same. All ate the same food yesterday.  Past Medical History  Diagnosis Date  . History of Hodgkin's disease   . ED (erectile dysfunction)   . Sarcoid ([place])   . Hypertension   . Hyperlipidemia   . GERD (gastroesophageal reflux disease)    Past Surgical History  Procedure Laterality Date  . Lung biopsy    . Arm surgery Left 2001  . Hernia repair    . Tonsillectomy    . Hardware removal Left 06/30/2014    Procedure: HARDWARE REMOVAL, left elbow;  Surgeon: Dorna Leitz, MD;  Location: [place];  Service: Orthopedics;  Laterality: Left;  . Incision and drainage abscess Left 06/30/2014    Procedure: INCISION AND DRAINAGE ABSCESS, left elbow;  Surgeon: Dorna Leitz, MD;  Location: [facility];  Service: Orthopedics;  Laterality: Left;   Family History  Problem Relation Age of Onset  . Hypertension Mother    . Hypertension Father   . Cancer Other   . Diabetes Brother    Social History  Substance Use Topics  . Smoking status: Former Smoker    Types: Cigarettes  . Smokeless tobacco: Never Used     Comment: stop smoking cigarettes in 2015  . Alcohol Use: Yes     Comment: rare    Review of Systems  Constitutional: Positive for diaphoresis. Negative for fever and chills.  Respiratory: Negative for cough, chest tightness and shortness of breath.   Cardiovascular: Negative for chest pain, palpitations and leg swelling.  Gastrointestinal: Positive for nausea, vomiting, abdominal pain and diarrhea. Negative for abdominal distention.  Genitourinary: Negative for dysuria, urgency, frequency and hematuria.  Musculoskeletal: Negative for myalgias, arthralgias, neck pain and neck stiffness.  Skin: Negative for rash.  Allergic/Immunologic: Negative for immunocompromised state.  Neurological: Negative for dizziness, weakness, light-headedness, numbness and headaches.  All other systems reviewed and are negative.     Allergies  Review of patient's allergies indicates no known allergies.  Home Medications   Prior to Admission medications   Medication Sig Start Date End Date Taking? Authorizing Provider  calcium carbonate (TUMS - DOSED IN MG ELEMENTAL CALCIUM) 500 MG chewable tablet Chew 1 tablet by mouth daily as needed for indigestion or heartburn.   Yes Historical Provider, MD  cephALEXin (KEFLEX) 500 MG capsule Take 1 capsule (500 mg total) by mouth 4 (four) times daily. Patient not taking: Reported on 11/29/2014 06/27/14   Veryl Speak, MD  oxyCODONE-acetaminophen [facility]) [phone]  MG per tablet Take 1-2 tablets by mouth every 6 (six) hours as needed for severe pain. Patient not taking: Reported on 11/29/2014 07/01/14   Gary Fleet, PA-C   BP 140/86 mmHg  Pulse 83  Temp(Src) 98.1 F (36.7 C) (Oral)  Resp 18  SpO2 100% Physical Exam  Constitutional: He is oriented to person, place, and  time. He appears well-developed and well-nourished.  Diaphoretic, shaking  HENT:  Head: Normocephalic and atraumatic.  Eyes: Conjunctivae are normal.  Neck: Neck supple.  Cardiovascular: Normal rate, regular rhythm and normal heart sounds.   Pulmonary/Chest: Effort normal. No respiratory distress. He has no wheezes. He has no rales.  Abdominal: Soft. Bowel sounds are normal. He exhibits no distension. There is tenderness. There is no rebound and no guarding.  LLQ tenderness  Musculoskeletal: He exhibits no edema.  Neurological: He is alert and oriented to person, place, and time.  Skin: Skin is warm and dry.  Nursing note and vitals reviewed.   ED Course  Procedures (including critical care time) Labs Review Labs Reviewed  CBC WITH DIFFERENTIAL/PLATELET - Abnormal; Notable for the following:    WBC 13.3 (*)    Neutro Abs 10.8 (*)    All other components within normal limits  COMPREHENSIVE METABOLIC PANEL - Abnormal; Notable for the following:    Glucose, Bld 135 (*)    BUN 23 (*)    All other components within normal limits  LIPASE, BLOOD - Abnormal; Notable for the following:    Lipase 163 (*)    All other components within normal limits  URINALYSIS, ROUTINE W REFLEX MICROSCOPIC (NOT AT [facility])    Imaging Review Ct Abdomen Pelvis W Contrast  11/29/2014  CLINICAL DATA:  44 year old male with nausea and vomiting since yesterday. Left side abdominal pain. Initial encounter. EXAM: CT ABDOMEN AND PELVIS WITH CONTRAST TECHNIQUE: Multidetector CT imaging of the abdomen and pelvis was performed using the standard protocol following bolus administration of intravenous contrast. CONTRAST:  165mL OMNIPAQUE IOHEXOL 300 MG/ML  SOLN COMPARISON:  CT Abdomen and Pelvis 04/26/2014 and earlier. FINDINGS: Increased dependent atelectasis. No pericardial or pleural effusion. No acute osseous abnormality identified. No pelvic free fluid. Diminutive urinary bladder. Decompressed rectum. Decompressed  sigmoid colon. Stable left inguinal surgical clips with adjacent 1.4 cm lymph node or soft tissue nodule, unchanged since 2013. Fluid in the left colon, but it is relatively decompressed. Mild fluid and gas in the transverse colon. Fluid in the right colon. Normal appendix. Negative terminal ileum. Distal small bowel fluid, but no dilated loops. Oral contrast distends the stomach and has not reached the duodenum. Liver, gallbladder (Phrygian cap), spleen, pancreas and adrenal glands are within normal limits. Portal venous system is patent. Aortoiliac calcified atherosclerosis noted. Major arterial structures are patent. No abdominal free fluid. No lymphadenopathy. Bilateral renal enhancement and contrast excretion within normal limits. IMPRESSION: 1. Fluid in the colon such as related to diarrhea. No inflamed bowel or inflammatory process identified in the abdomen or pelvis. 2. 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 Electronically Signed   By: Genevie Ann M.D.   On: 11/29/2014 12:35   I have personally reviewed and evaluated these images and lab results as part of my medical decision-making.   EKG Interpretation None      MDM   Final diagnoses:  Nausea vomiting and diarrhea  Acute pancreatitis, unspecified pancreatitis type    9:42 AM Pt seen and examined. Pt with left lower quadrant abdominal pain, nausea, vomiting, diarrhea.  Abdomen is soft, no guarding, doubt surgical abdomen. Pt does however appear very uncomfortable, he is diaphoretic, shaking, moaning in pain. Pain meds ordered, fluids, will start with labs.   12:46 PM CT negative. Patient's lipase mildly elevated. He is being treated with IV fluids, he has had Dilaudid, Zofran, Reglan and he states he is still having nausea. He is not actively vomiting. Will try Phenergan.  3:56 PM Patient feels much better. He is no longer vomiting. He is tolerating ginger ale. At this time given improvement in symptoms we'll  discharge home. Discussed with him and his wife the plan to stay on clear liquids, will prescribe Norco for pain, Phenergan for nausea and vomiting. Follow with primary care doctor. Return if symptoms are worsening. His vital signs are normal at this time. His abdomen is soft.  Filed Vitals:   11/29/14 1400 11/29/14 1430 11/29/14 1500 11/29/14 1530  BP: 118/75 124/82 112/77 121/83  Pulse: 64 64 73 72  Temp:      TempSrc:      Resp:  18 16   SpO2: 98% 100% 98% 96%     Jeannett Senior, PA-C 11/29/14 [place] Liu, MD 11/29/14 [phone]

## 2014-11-29 NOTE — ED Notes (Signed)
Patient aware that urine sample is needed. Urinal at bedside. 

## 2014-11-29 NOTE — ED Notes (Signed)
Per pt, states vomiting started early in am-startted feeling nauseated yesterday

## 2014-11-29 NOTE — ED Notes (Signed)
Patient reminded urine sample is needed.  

## 2014-11-29 NOTE — ED Notes (Signed)
Patient transported to CT 

## 2014-11-29 NOTE — Discharge Instructions (Signed)
Norco for severe pain. He can take Tylenol or Motrin for mild pain. Phenergan for nausea and vomiting. Clear liquids for the next 48 hours. If improving, follow-up with primary care doctor or gastroenterologist. If worsening return to emergency department.   Acute Pancreatitis Acute pancreatitis is a disease in which the pancreas becomes suddenly irritated (inflamed). The pancreas is a large gland behind your stomach. The pancreas makes enzymes that help digest food. The pancreas also makes 2 hormones that help control your blood sugar. Acute pancreatitis happens when the enzymes attack and damage the pancreas. Most attacks last a couple of days and can cause serious problems. HOME CARE  Follow your doctor's diet instructions. You may need to avoid alcohol and limit fat in your diet.  Eat small meals often.  Drink enough fluids to keep your pee (urine) clear or pale yellow.  Only take medicines as told by your doctor.  Avoid drinking alcohol if it caused your disease.  Do not smoke.  Get plenty of rest.  Check your blood sugar at home as told by your doctor.  Keep all doctor visits as told. GET HELP IF:  You do not get better as quickly as expected.  You have new or worsening symptoms.  You have lasting pain, weakness, or feel sick to your stomach (nauseous).  You get better and then have another pain attack. GET HELP RIGHT AWAY IF:   You are unable to eat or keep fluids down.  Your pain becomes severe.  You have a fever or lasting symptoms for more than 2 to 3 days.  You have a fever and your symptoms suddenly get worse.  Your skin or the white part of your eyes turn yellow (jaundice).  You throw up (vomit).  You feel dizzy, or you pass out (faint).  Your blood sugar is high (over 300 mg/dL). MAKE SURE YOU:   Understand these instructions.  Will watch your condition.  Will get help right away if you are not doing well or get worse.   This information is not  intended to replace advice given to you by your health care provider. Make sure you discuss any questions you have with your health care provider.   Document Released: 06/27/2007 Document Revised: 01/29/2014 Document Reviewed: 04/19/2011 Elsevier Interactive Patient Education 2016 [employer]. Viral Gastroenteritis Viral gastroenteritis is also known as stomach flu. This condition affects the stomach and intestinal tract. It can cause sudden diarrhea and vomiting. The illness typically lasts 3 to 8 days. Most people develop an immune response that eventually gets rid of the virus. While this natural response develops, the virus can make you quite ill. CAUSES  Many different viruses can cause gastroenteritis, such as rotavirus or noroviruses. You can catch one of these viruses by consuming contaminated food or water. You may also catch a virus by sharing utensils or other personal items with an infected person or by touching a contaminated surface. SYMPTOMS  The most common symptoms are diarrhea and vomiting. These problems can cause a severe loss of body fluids (dehydration) and a body salt (electrolyte) imbalance. Other symptoms may include:  Fever.  Headache.  Fatigue.  Abdominal pain. DIAGNOSIS  Your caregiver can usually diagnose viral gastroenteritis based on your symptoms and a physical exam. A stool sample may also be taken to test for the presence of viruses or other infections. TREATMENT  This illness typically goes away on its own. Treatments are aimed at rehydration. The most serious cases of viral gastroenteritis  involve vomiting so severely that you are not able to keep fluids down. In these cases, fluids must be given through an intravenous line (IV). HOME CARE INSTRUCTIONS   Drink enough fluids to keep your urine clear or pale yellow. Drink small amounts of fluids frequently and increase the amounts as tolerated.  Ask your caregiver for specific rehydration  instructions.  Avoid:  Foods high in sugar.  Alcohol.  Carbonated drinks.  Tobacco.  Juice.  Caffeine drinks.  Extremely hot or cold fluids.  Fatty, greasy foods.  Too much intake of anything at one time.  Dairy products until 24 to 48 hours after diarrhea stops.  You may consume probiotics. Probiotics are active cultures of beneficial bacteria. They may lessen the amount and number of diarrheal stools in adults. Probiotics can be found in yogurt with active cultures and in supplements.  Wash your hands well to avoid spreading the virus.  Only take over-the-counter or prescription medicines for pain, discomfort, or fever as directed by your caregiver. Do not give aspirin to children. Antidiarrheal medicines are not recommended.  Ask your caregiver if you should continue to take your regular prescribed and over-the-counter medicines.  Keep all follow-up appointments as directed by your caregiver. SEEK IMMEDIATE MEDICAL CARE IF:   You are unable to keep fluids down.  You do not urinate at least once every 6 to 8 hours.  You develop shortness of breath.  You notice blood in your stool or vomit. This may look like coffee grounds.  You have abdominal pain that increases or is concentrated in one small area (localized).  You have persistent vomiting or diarrhea.  You have a fever.  The patient is a child younger than 3 months, and he or she has a fever.  The patient is a child older than 3 months, and he or she has a fever and persistent symptoms.  The patient is a child older than 3 months, and he or she has a fever and symptoms suddenly get worse.  The patient is a baby, and he or she has no tears when crying. MAKE SURE YOU:   Understand these instructions.  Will watch your condition.  Will get help right away if you are not doing well or get worse.   This information is not intended to replace advice given to you by your health care provider. Make sure you  discuss any questions you have with your health care provider.   Document Released: 01/08/2005 Document Revised: 04/02/2011 Document Reviewed: 10/25/2010 Elsevier Interactive Patient Education [employer].

## 2017-01-24 ENCOUNTER — Emergency Department (HOSPITAL_COMMUNITY): Payer: Medicare Other

## 2017-01-24 ENCOUNTER — Encounter (HOSPITAL_COMMUNITY): Payer: Self-pay

## 2017-01-24 ENCOUNTER — Other Ambulatory Visit: Payer: Self-pay

## 2017-01-24 ENCOUNTER — Emergency Department (HOSPITAL_COMMUNITY)
Admission: EM | Admit: 2017-01-24 | Discharge: 2017-01-24 | Disposition: A | Discharge status: Home / Self Care | Payer: Medicare Other | Attending: Emergency Medicine | Admitting: Emergency Medicine

## 2017-01-24 DIAGNOSIS — R079 Chest pain, unspecified: Secondary | ICD-10-CM | POA: Insufficient documentation

## 2017-01-24 DIAGNOSIS — Z87891 Personal history of nicotine dependence: Secondary | ICD-10-CM | POA: Insufficient documentation

## 2017-01-24 DIAGNOSIS — I10 Essential (primary) hypertension: Secondary | ICD-10-CM | POA: Insufficient documentation

## 2017-01-24 DIAGNOSIS — F419 Anxiety disorder, unspecified: Secondary | ICD-10-CM | POA: Insufficient documentation

## 2017-01-24 DIAGNOSIS — E785 Hyperlipidemia, unspecified: Secondary | ICD-10-CM | POA: Insufficient documentation

## 2017-01-24 LAB — BASIC METABOLIC PANEL
ANION GAP: 9 (ref 5–15)
BUN: 21 mg/dL — ABNORMAL HIGH (ref 6–20)
CHLORIDE: 102 mmol/L (ref 101–111)
CO2: 26 mmol/L (ref 22–32)
Calcium: 9.7 mg/dL (ref 8.9–10.3)
Creatinine, Ser: 1.3 mg/dL — ABNORMAL HIGH (ref 0.61–1.24)
GFR calc non Af Amer: >60 mL/min (ref >60)
GLUCOSE: 125 mg/dL — AB (ref 65–99)
POTASSIUM: 3.6 mmol/L (ref 3.5–5.1)
Sodium: 137 mmol/L (ref 135–145)

## 2017-01-24 LAB — CBC
HEMATOCRIT: 42.9 % (ref 39.0–52.0)
HEMOGLOBIN: 14.9 g/dL (ref 13.0–17.0)
MCH: 31.9 pg (ref 26.0–34.0)
MCHC: 34.7 g/dL (ref 30.0–36.0)
MCV: 91.9 fL (ref 78.0–100.0)
Platelets: 251 10*3/uL (ref 150–400)
RBC: 4.67 MIL/uL (ref 4.22–5.81)
RDW: 13.8 % (ref 11.5–15.5)
WBC: 7.1 10*3/uL (ref 4.0–10.5)

## 2017-01-24 LAB — I-STAT TROPONIN, ED: TROPONIN I, POC: 0 ng/mL (ref 0.00–0.08)

## 2017-01-24 NOTE — ED Triage Notes (Signed)
Per Pt, Pt is coming from home with complaints of CP and SOB. Started the day after Christmas after he was told by his wife that she did not want to be with him anymore. Pt reports that today, he found out that his son was going to have first degree murder charges. Pt appears to be anxious at this time. Hx of cancer.

## 2017-01-24 NOTE — ED Notes (Signed)
Pt reports sleeping in his car at this time and not having anywhere to go at this time.

## 2017-01-24 NOTE — ED Provider Notes (Signed)
[facility] EMERGENCY DEPARTMENT Provider Note   CSN: [id number] Arrival date & time: 01/24/17  1614     History   Chief Complaint Chief Complaint  Patient presents with  . Chest Pain  . Shortness of Breath    HPI [name] is a 47 y.o. male.  HPI   47 year old male presents today with complaints of chest pain and anxiety.  Patient notes that for the last several weeks he has had intermittent chest pain sharp in nature with associated tingling and coldness in his fingers and shortness of breath.  Patient notes this is associated with the anxiety that he has been having.  He notes that when he has anxiety provoking thoughts the symptoms come on.  She notes that his son is going to prison for murder, his wife has left him.  He reports that he does not smoke, but has been smoking marijuana more recently.  He denies any previous cardiac history.  No history of DVT or PE, no significant risk factors.   Past Medical History:  Diagnosis Date  . ED (erectile dysfunction)   . GERD (gastroesophageal reflux disease)   . History of Hodgkin's disease   . Hyperlipidemia   . Hypertension   . Sarcoid     Patient Active Problem List   Diagnosis Date Noted  . Presence of retained hardware 06/30/2014  . Foreign body of elbow with infection 06/30/2014  . Infection of elbow ([place]) 06/30/2014  . Tobacco abuse 02/01/2011  . Dyspnea 02/01/2011  . [place] DISEASE 02/03/2008  . DM 02/03/2008  . Sarcoidosis 10/23/2007  . CHEST PAIN 10/23/2007    Past Surgical History:  Procedure Laterality Date  . arm surgery Left 2001  . HARDWARE REMOVAL Left 06/30/2014   Procedure: HARDWARE REMOVAL, left elbow;  Surgeon: Dorna Leitz, MD;  Location: [facility];  Service: Orthopedics;  Laterality: Left;  . HERNIA REPAIR    . INCISION AND DRAINAGE ABSCESS Left 06/30/2014   Procedure: INCISION AND DRAINAGE ABSCESS, left elbow;  Surgeon: Dorna Leitz, MD;  Location: [facility];  Service:  Orthopedics;  Laterality: Left;  . LUNG BIOPSY    . TONSILLECTOMY         Home Medications    Prior to Admission medications   Medication Sig Start Date End Date Taking? Authorizing Provider  calcium carbonate (TUMS - DOSED IN MG ELEMENTAL CALCIUM) 500 MG chewable tablet Chew 1 tablet by mouth daily as needed for indigestion or heartburn.    [provider]  cephALEXin (KEFLEX) 500 MG capsule Take 1 capsule (500 mg total) by mouth 4 (four) times daily. Patient not taking: Reported on 11/29/2014 06/27/14   Veryl Speak, MD  HYDROcodone-acetaminophen (NORCO) 5-325 MG tablet Take 1 tablet by mouth every 6 (six) hours as needed for moderate pain. 11/29/14   Kirichenko, Lahoma Rocker, PA-C  oxyCODONE-acetaminophen (PERCOCET) 5-325 MG per tablet Take 1-2 tablets by mouth every 6 (six) hours as needed for severe pain. Patient not taking: Reported on 11/29/2014 07/01/14   Gary Fleet, PA-C  promethazine (PHENERGAN) 25 MG tablet Take 1 tablet (25 mg total) by mouth every 6 (six) hours as needed for nausea or vomiting. 11/29/14   Jeannett Senior, PA-C    Family History Family History  Problem Relation Age of Onset  . Hypertension Mother   . Hypertension Father   . Cancer Other   . Diabetes Brother     Social History Social History   Tobacco Use  . Smoking status: Former  Smoker    Types: Cigarettes  . Smokeless tobacco: Never Used  . Tobacco comment: stop smoking cigarettes in 2015  Substance Use Topics  . Alcohol use: Yes    Comment: rare  . Drug use: Yes    Types: Marijuana    Comment: occasional     Allergies   Patient has no known allergies.   Review of Systems Review of Systems  All other systems reviewed and are negative.    Physical Exam Updated Vital Signs BP (!) 123/96 (BP Location: Right Arm)   Pulse 74   Temp 98.6 F (37 C) (Oral)   Resp 16   Ht 5\' 5"  (1.651 m)   Wt 59 kg (130 lb)   SpO2 98%   BMI 21.63 kg/m   Physical Exam  Constitutional: He  is oriented to person, place, and time. He appears well-developed and well-nourished.  HENT:  Head: Normocephalic and atraumatic.  Eyes: Conjunctivae are normal. Pupils are equal, round, and reactive to light. Right eye exhibits no discharge. Left eye exhibits no discharge. No scleral icterus.  Neck: Normal range of motion. No JVD present. No tracheal deviation present.  Cardiovascular: Normal rate, regular rhythm, normal heart sounds and intact distal pulses. Exam reveals no gallop and no friction rub.  No murmur heard. Pulmonary/Chest: Effort normal and breath sounds normal. No stridor. No respiratory distress. He has no wheezes. He has no rales. He exhibits no tenderness.  Musculoskeletal: Normal range of motion. He exhibits no edema.  Neurological: He is alert and oriented to person, place, and time. Coordination normal.  Psychiatric: He has a normal mood and affect. His behavior is normal. Judgment and thought content normal.  Nursing note and vitals reviewed.    ED Treatments / Results  Labs (all labs ordered are listed, but only abnormal results are displayed) Labs Reviewed  BASIC METABOLIC PANEL - Abnormal; Notable for the following components:      Result Value   Glucose, Bld 125 (*)    BUN 21 (*)    Creatinine, Ser 1.30 (*)    All other components within normal limits  CBC  I-STAT TROPONIN, ED    EKG  EKG Interpretation  Date/Time:  Thursday January 24 2017 16:16:51 EST Ventricular Rate:  91 PR Interval:  144 QRS Duration: 74 QT Interval:  338 QTC Calculation: 415 R Axis:   83 Text Interpretation:  Normal sinus rhythm Right atrial enlargement Left ventricular hypertrophy Cannot rule out Septal infarct , age undetermined Abnormal ECG Confirmed by Quintella Reichert [phone]) on 01/24/2017 8:10:02 PM       Radiology Dg Chest 2 View  Result Date: 01/24/2017 CLINICAL DATA:  Left-sided chest pain for 2 weeks. Shortness of breath. EXAM: CHEST  2 VIEW COMPARISON:  December 20, 2010 FINDINGS: The heart size and mediastinal contours are within normal limits. Both lungs are clear. The visualized skeletal structures are unremarkable. IMPRESSION: No active cardiopulmonary disease. Electronically Signed   By: Dorise Bullion III M.D   On: 01/24/2017 19:36    Procedures Procedures (including critical care time)  Medications Ordered in ED Medications - No data to display   Initial Impression / Assessment and Plan / ED Course  I have reviewed the triage vital signs and the nursing notes.  Pertinent labs & imaging results that were available during my care of the patient were reviewed by me and considered in my medical decision making (see chart for details).     Final Clinical Impressions(s) / ED  Diagnoses   Final diagnoses:  Anxiety  Chest pain, unspecified type    Labs: Stat troponin, BMP, CBC  Imaging: Chest 2 view  Consults:  Therapeutics:  Discharge Meds:   Assessment/Plan: 47 year old male presents today with complaints of chest pain shortness of breath and anxiety.  I have very high suspicion that this is all related to anxiety, unlikely cardiac in nature, very low suspicion for ACS PE, dissection, or any other intrathoracic abnormality.  Patient's workup here is very reassuring with no signs of the above.  Patient resting comfortably in exam bed.  Patient be discharged with symptomatic care instructions and strict return precautions.  Patient verbalized understanding and agreement to today's plan had no further questions or concerns at the time of discharge.    ED Discharge Orders    None       Francee Gentile 01/24/17 2116    Okey Regal, PA-C 01/24/17 2117    Quintella Reichert, MD 01/25/17 1254

## 2017-01-24 NOTE — Discharge Instructions (Signed)
Please read attached information. If you experience any new or worsening signs or symptoms please return to the emergency room for evaluation. Please follow-up with your primary care provider or specialist as discussed.  °

## 2017-02-14 ENCOUNTER — Encounter (HOSPITAL_COMMUNITY): Payer: Self-pay

## 2017-02-14 ENCOUNTER — Emergency Department (HOSPITAL_COMMUNITY)
Admission: EM | Admit: 2017-02-14 | Discharge: 2017-02-14 | Disposition: A | Discharge status: Home / Self Care | Payer: Medicare Other | Attending: Emergency Medicine | Admitting: Emergency Medicine

## 2017-02-14 ENCOUNTER — Other Ambulatory Visit: Payer: Self-pay

## 2017-02-14 DIAGNOSIS — Z87891 Personal history of nicotine dependence: Secondary | ICD-10-CM | POA: Insufficient documentation

## 2017-02-14 DIAGNOSIS — Z590 Homelessness: Secondary | ICD-10-CM | POA: Insufficient documentation

## 2017-02-14 DIAGNOSIS — F4325 Adjustment disorder with mixed disturbance of emotions and conduct: Secondary | ICD-10-CM | POA: Diagnosis present

## 2017-02-14 DIAGNOSIS — F332 Major depressive disorder, recurrent severe without psychotic features: Secondary | ICD-10-CM | POA: Insufficient documentation

## 2017-02-14 DIAGNOSIS — R45851 Suicidal ideations: Secondary | ICD-10-CM | POA: Insufficient documentation

## 2017-02-14 DIAGNOSIS — I10 Essential (primary) hypertension: Secondary | ICD-10-CM | POA: Insufficient documentation

## 2017-02-14 LAB — ACETAMINOPHEN LEVEL: Acetaminophen (Tylenol), Serum: <10 ug/mL — ABNORMAL LOW (ref 10–30)

## 2017-02-14 LAB — ETHANOL

## 2017-02-14 LAB — RAPID URINE DRUG SCREEN, HOSP PERFORMED
Amphetamines: NOT DETECTED
Barbiturates: NOT DETECTED
Benzodiazepines: NOT DETECTED
Cocaine: NOT DETECTED
OPIATES: NOT DETECTED
Tetrahydrocannabinol: POSITIVE — AB

## 2017-02-14 LAB — COMPREHENSIVE METABOLIC PANEL
ALBUMIN: 4.7 g/dL (ref 3.5–5.0)
ALK PHOS: 87 U/L (ref 38–126)
ALT: 15 U/L — ABNORMAL LOW (ref 17–63)
ANION GAP: 9 (ref 5–15)
AST: 24 U/L (ref 15–41)
BUN: 15 mg/dL (ref 6–20)
CALCIUM: 9.6 mg/dL (ref 8.9–10.3)
CO2: 27 mmol/L (ref 22–32)
CREATININE: 1.12 mg/dL (ref 0.61–1.24)
Chloride: 103 mmol/L (ref 101–111)
GFR calc Af Amer: >60 mL/min (ref >60)
GFR calc non Af Amer: >60 mL/min (ref >60)
Glucose, Bld: 97 mg/dL (ref 65–99)
Potassium: 3.5 mmol/L (ref 3.5–5.1)
Sodium: 139 mmol/L (ref 135–145)
TOTAL PROTEIN: 8.1 g/dL (ref 6.5–8.1)
Total Bilirubin: 0.8 mg/dL (ref 0.3–1.2)

## 2017-02-14 LAB — CBC
HCT: 41.1 % (ref 39.0–52.0)
Hemoglobin: 14 g/dL (ref 13.0–17.0)
MCH: 31.3 pg (ref 26.0–34.0)
MCHC: 34.1 g/dL (ref 30.0–36.0)
MCV: 91.7 fL (ref 78.0–100.0)
PLATELETS: 211 10*3/uL (ref 150–400)
RBC: 4.48 MIL/uL (ref 4.22–5.81)
RDW: 13.8 % (ref 11.5–15.5)
WBC: 10.7 10*3/uL — ABNORMAL HIGH (ref 4.0–10.5)

## 2017-02-14 LAB — SALICYLATE LEVEL

## 2017-02-14 MED ORDER — ONDANSETRON HCL 4 MG PO TABS: 4.0000 mg | ORAL_TABLET | Freq: Three times a day (TID) | ORAL | Status: DC | PRN

## 2017-02-14 MED ORDER — TRAZODONE HCL 100 MG PO TABS: 100.0000 mg | ORAL_TABLET | Freq: Every day | ORAL | Status: DC

## 2017-02-14 MED ORDER — FLUOXETINE HCL 20 MG PO CAPS: 20.0000 mg | ORAL_CAPSULE | Freq: Every day | ORAL | Status: DC

## 2017-02-14 MED ORDER — IBUPROFEN 200 MG PO TABS: 600.0000 mg | ORAL_TABLET | Freq: Three times a day (TID) | ORAL | Status: DC | PRN

## 2017-02-14 MED ORDER — TRAZODONE HCL 100 MG PO TABS: 100.0000 mg | ORAL_TABLET | Freq: Every day | ORAL | 0 refills | Status: DC

## 2017-02-14 NOTE — ED Notes (Signed)
Pt admitted to room #39. Pt denies SI/HI/AVH. "I'm a broken spirit." Encouragement and support provided. Special checks q 15 mins in place for safety, Video monitoring in place. Will continue to monitor.

## 2017-02-14 NOTE — ED Notes (Signed)
Pt d/c home per MD order. Discharge summary reviewed with pt. Pt verbalizes understanding. Pt denies SI/HI/AVH. Personal property returned to pt. Pt signed e-signature. Ambulatory off unit with [facility].

## 2017-02-14 NOTE — ED Notes (Signed)
Pt reporting to this nurse he wants to go home. This nurse notified Jameson,NP.
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
## 2017-02-14 NOTE — ED Provider Notes (Signed)
[facility] DEPT Provider Note   CSN: [id number] Arrival date & time: 02/14/17  0134     History   Chief Complaint Chief Complaint  Patient presents with  . Suicidal    HPI [name] is a 47 y.o. male.  The history is provided by the patient.  Depression  This is a new problem. The current episode started more than 1 week ago (4 weeks ago when his wife broke up with him.  ). The problem occurs constantly. The problem has been gradually worsening. Pertinent negatives include no chest pain, no abdominal pain, no headaches and no shortness of breath. Nothing aggravates the symptoms. Nothing relieves the symptoms. He has tried nothing for the symptoms. The treatment provided no relief.    Past Medical History:  Diagnosis Date  . ED (erectile dysfunction)   . GERD (gastroesophageal reflux disease)   . History of Hodgkin's disease   . Hyperlipidemia   . Hypertension   . Sarcoid     Patient Active Problem List   Diagnosis Date Noted  . Presence of retained hardware 06/30/2014  . Foreign body of elbow with infection 06/30/2014  . Infection of elbow ([place]) 06/30/2014  . Tobacco abuse 02/01/2011  . Dyspnea 02/01/2011  . [place] DISEASE 02/03/2008  . DM 02/03/2008  . Sarcoidosis 10/23/2007  . CHEST PAIN 10/23/2007    Past Surgical History:  Procedure Laterality Date  . arm surgery Left 2001  . HARDWARE REMOVAL Left 06/30/2014   Procedure: HARDWARE REMOVAL, left elbow;  Surgeon: Dorna Leitz, MD;  Location: [facility];  Service: Orthopedics;  Laterality: Left;  . HERNIA REPAIR    . INCISION AND DRAINAGE ABSCESS Left 06/30/2014   Procedure: INCISION AND DRAINAGE ABSCESS, left elbow;  Surgeon: Dorna Leitz, MD;  Location: [facility];  Service: Orthopedics;  Laterality: Left;  . LUNG BIOPSY    . TONSILLECTOMY         Home Medications    Prior to Admission medications   Medication Sig Start Date End Date Taking? Authorizing Provider  cephALEXin  (KEFLEX) 500 MG capsule Take 1 capsule (500 mg total) by mouth 4 (four) times daily. Patient not taking: Reported on 11/29/2014 06/27/14   Veryl Speak, MD  HYDROcodone-acetaminophen (NORCO) 5-325 MG tablet Take 1 tablet by mouth every 6 (six) hours as needed for moderate pain. Patient not taking: Reported on 02/14/2017 11/29/14   Jeannett Senior, PA-C  oxyCODONE-acetaminophen (PERCOCET) 5-325 MG per tablet Take 1-2 tablets by mouth every 6 (six) hours as needed for severe pain. Patient not taking: Reported on 11/29/2014 07/01/14   Gary Fleet, PA-C  promethazine (PHENERGAN) 25 MG tablet Take 1 tablet (25 mg total) by mouth every 6 (six) hours as needed for nausea or vomiting. Patient not taking: Reported on 02/14/2017 11/29/14   Jeannett Senior, PA-C    Family History Family History  Problem Relation Age of Onset  . Hypertension Mother   . Hypertension Father   . Cancer Other   . Diabetes Brother     Social History Social History   Tobacco Use  . Smoking status: Former Smoker    Types: Cigarettes  . Smokeless tobacco: Never Used  . Tobacco comment: stop smoking cigarettes in 2015  Substance Use Topics  . Alcohol use: Yes    Comment: rare  . Drug use: Yes    Types: Marijuana    Comment: occasional     Allergies   Patient has no known allergies.   Review of  Systems Review of Systems  Respiratory: Negative for shortness of breath.   Cardiovascular: Negative for chest pain.  Gastrointestinal: Negative for abdominal pain.  Neurological: Negative for headaches.  Psychiatric/Behavioral: Positive for depression, dysphoric mood and suicidal ideas. Negative for self-injury. The patient is not nervous/anxious.   All other systems reviewed and are negative.    Physical Exam Updated Vital Signs BP (!) 140/98 (BP Location: Left Arm)   Pulse 90   Temp 98.7 F (37.1 C) (Oral)   Resp 18   SpO2 99%   Physical Exam  Constitutional: He is oriented to person, place, and  time. He appears well-developed and well-nourished. No distress.  HENT:  Head: Normocephalic and atraumatic.  Mouth/Throat: No oropharyngeal exudate.  Eyes: Conjunctivae are normal. Pupils are equal, round, and reactive to light.  Neck: Normal range of motion. Neck supple.  Cardiovascular: Normal rate, regular rhythm, normal heart sounds and intact distal pulses.  Pulmonary/Chest: Effort normal and breath sounds normal. No stridor. He has no wheezes. He has no rales.  Abdominal: Soft. Bowel sounds are normal. He exhibits no mass. There is no tenderness. There is no rebound and no guarding.  Musculoskeletal: Normal range of motion.  Neurological: He is alert and oriented to person, place, and time. He displays normal reflexes.  Skin: Skin is warm and dry. Capillary refill takes less than 2 seconds.  Psychiatric: His affect is blunt. He expresses no suicidal plans and no homicidal plans.  Nursing note and vitals reviewed.    ED Treatments / Results  Labs (all labs ordered are listed, but only abnormal results are displayed)  Results for orders placed or performed during the hospital encounter of 02/14/17  Comprehensive metabolic panel  Result Value Ref Range   Sodium 139 135 - 145 mmol/L   Potassium 3.5 3.5 - 5.1 mmol/L   Chloride 103 101 - 111 mmol/L   CO2 27 22 - 32 mmol/L   Glucose, Bld 97 65 - 99 mg/dL   BUN 15 6 - 20 mg/dL   Creatinine, Ser 1.12 0.61 - 1.24 mg/dL   Calcium 9.6 8.9 - 10.3 mg/dL   Total Protein 8.1 6.5 - 8.1 g/dL   Albumin 4.7 3.5 - 5.0 g/dL   AST 24 15 - 41 U/L   ALT 15 (L) 17 - 63 U/L   Alkaline Phosphatase 87 38 - 126 U/L   Total Bilirubin 0.8 0.3 - 1.2 mg/dL   GFR calc non Af Amer >60 >60 mL/min   GFR calc Af Amer >60 >60 mL/min   Anion gap 9 5 - 15  Ethanol  Result Value Ref Range   Alcohol, Ethyl (B) <70 <26 mg/dL  Salicylate level  Result Value Ref Range   Salicylate Lvl <3.7 2.8 - 30.0 mg/dL  Acetaminophen level  Result Value Ref Range    Acetaminophen (Tylenol), Serum <10 (L) 10 - 30 ug/mL  cbc  Result Value Ref Range   WBC 10.7 (H) 4.0 - 10.5 K/uL   RBC 4.48 4.22 - 5.81 MIL/uL   Hemoglobin 14.0 13.0 - 17.0 g/dL   HCT 41.1 39.0 - 52.0 %   MCV 91.7 78.0 - 100.0 fL   MCH 31.3 26.0 - 34.0 pg   MCHC 34.1 30.0 - 36.0 g/dL   RDW 13.8 11.5 - 15.5 %   Platelets 211 150 - 400 K/uL  Rapid urine drug screen (hospital performed)  Result Value Ref Range   Opiates NONE DETECTED NONE DETECTED   Cocaine NONE DETECTED NONE  DETECTED   Benzodiazepines NONE DETECTED NONE DETECTED   Amphetamines NONE DETECTED NONE DETECTED   Tetrahydrocannabinol POSITIVE (A) NONE DETECTED   Barbiturates NONE DETECTED NONE DETECTED   Dg Chest 2 View  Result Date: 01/24/2017 CLINICAL DATA:  Left-sided chest pain for 2 weeks. Shortness of breath. EXAM: CHEST  2 VIEW COMPARISON:  December 20, 2010 FINDINGS: The heart size and mediastinal contours are within normal limits. Both lungs are clear. The visualized skeletal structures are unremarkable. IMPRESSION: No active cardiopulmonary disease. Electronically Signed   By: Dorise Bullion III M.D   On: 01/24/2017 19:36    Procedures Procedures (including critical care time)    Final Clinical Impressions(s) / ED Diagnoses   Final diagnoses:  Suicidal ideation    Medically cleared by me.  Holding orders placed.    Alroy Portela, MD 02/14/17 [phone]

## 2017-02-14 NOTE — [facility] Suicide Risk Assessment (Signed)
Suicide Risk Assessment  Discharge Assessment   [facility] Discharge Suicide Risk Assessment   Principal Problem: Adjustment disorder with mixed disturbance of emotions and conduct Discharge Diagnoses:  Patient Active Problem List   Diagnosis Date Noted  . Adjustment disorder with mixed disturbance of emotions and conduct [F43.25] 02/14/2017    Priority: High  . Presence of retained hardware [Z96.9] 06/30/2014  . Foreign body of elbow with infection [S50.359A, L08.9] 06/30/2014  . Infection of elbow ([place]) [M00.9] 06/30/2014  . Tobacco abuse [Z72.0] 02/01/2011  . Dyspnea [R06.00] 02/01/2011  . HODGKIN'S DISEASE [C81.90] 02/03/2008  . DM [E11.9] 02/03/2008  . Sarcoidosis [D86.9] 10/23/2007  . CHEST PAIN [R07.9] 10/23/2007    Total Time spent with patient: 45 minutes  Musculoskeletal: Strength & Muscle Tone: within normal limits Gait & Station: normal Patient leans: N/A  Psychiatric Specialty Exam:   Blood pressure 120/70, pulse 70, temperature 98.9 F (37.2 C), temperature source Oral, resp. rate 17, SpO2 100 %.There is no height or weight on file to calculate BMI.  General Appearance: Casual  Eye Contact::  Good  Speech:  Normal Rate409  Volume:  Normal  Mood:  Euthymic  Affect:  Congruent  Thought Process:  Coherent and Descriptions of Associations: Intact  Orientation:  Full (Time, Place, and Person)  Thought Content:  WDL and Logical  Suicidal Thoughts:  No  Homicidal Thoughts:  No  Memory:  Immediate;   Good Recent;   Good Remote;   Good  Judgement:  Fair  Insight:  Fair  Psychomotor Activity:  Normal  Concentration:  Fair  Recall:  Good  Fund of Knowledge:Fair  Language: Good  Akathisia:  No  Handed:  Right  AIMS (if indicated):     Assets:  Leisure Time Physical Health Resilience  Sleep:     Cognition: WNL  ADL's:  Intact   Mental Status Per Nursing Assessment::   On Admission:   47 yo male who presented to the ED with suicidal ideations after separating  from his wife and living in the hotel.  He told the psychiatrist and this NP that he was not suicidal this morning, just tired and wants to sleep.  Shortly later, he told the TTS counselor he was suicidal.  Then, after sleeping and eating, he asked to leave because he was worried about his car at the hotel.  Feels safe, no plans to hurt himself, he just wants to sleep.  Denies suicidal/homicidal ideations, hallucinations, and substance abuse.  Stable for discharge.  Demographic Factors:  Male  Loss Factors: Loss of significant relationship  Historical Factors: NA  Risk Reduction Factors:   Sense of responsibility to family, Employed and Positive social support  Continued Clinical Symptoms:  None   Cognitive Features That Contribute To Risk:  None    Suicide Risk:  Minimal: No identifiable suicidal ideation.  Patients presenting with no risk factors but with morbid ruminations; may be classified as minimal risk based on the severity of the depressive symptoms    Plan Of Care/Follow-up recommendations:  Activity:  as tolerated Diet:  heart healhty diet  LORD, JAMISON, NP 02/14/2017, 2:12 PM

## 2017-02-14 NOTE — ED Notes (Signed)
Bed: WTR6 Expected date:  Expected time:  Means of arrival:  Comments: 

## 2017-02-14 NOTE — Discharge Instructions (Signed)
For your behavioral health needs you are advised to follow up with [facility].  New patients are seen at their walk-in clinic.  Walk-in hours are Monday - Friday from 8:00 am - 12:00 pm, and from 1:00 pm - 3:00 pm.  Walk-in patients are seen on a first come, first served basis, so try to arrive as early as possible for the best chance of being seen the same day.  There is an initial fee of $22.50: ° °     [facility] °     [address] °     [place], [place] [address] °     [phone] °

## 2017-02-14 NOTE — BH Assessment (Signed)
[facility] Assessment Progress Note  Per Waylan Boga, DNP, this pt does not require psychiatric hospitalization at this time.  Pt is to be discharged from [facility] with recommendation to follow up with [facility].  This has been included in pt's discharge instructions.  Pt's nurse, Caryl Pina, has been notified.  Jalene Mullet, [place] Triage Specialist [phone]

## 2017-02-14 NOTE — ED Triage Notes (Signed)
Pt from [facility] with [employer]. He reports generalized pain and states that he does not think he can go on. He endorses taking 3 sleeping pills today. No plan to harm himself. Voluntary.

## 2017-06-10 ENCOUNTER — Encounter (HOSPITAL_COMMUNITY): Payer: Self-pay | Admitting: Obstetrics and Gynecology

## 2017-06-10 ENCOUNTER — Emergency Department (HOSPITAL_COMMUNITY): Payer: Self-pay

## 2017-06-10 ENCOUNTER — Emergency Department (HOSPITAL_COMMUNITY)
Admission: EM | Admit: 2017-06-10 | Discharge: 2017-06-10 | Disposition: A | Discharge status: Home / Self Care | Payer: Self-pay | Attending: Emergency Medicine | Admitting: Emergency Medicine

## 2017-06-10 DIAGNOSIS — Y998 Other external cause status: Secondary | ICD-10-CM | POA: Insufficient documentation

## 2017-06-10 DIAGNOSIS — S161XXA Strain of muscle, fascia and tendon at neck level, initial encounter: Secondary | ICD-10-CM | POA: Insufficient documentation

## 2017-06-10 DIAGNOSIS — Z87891 Personal history of nicotine dependence: Secondary | ICD-10-CM | POA: Insufficient documentation

## 2017-06-10 DIAGNOSIS — I10 Essential (primary) hypertension: Secondary | ICD-10-CM | POA: Insufficient documentation

## 2017-06-10 DIAGNOSIS — Y92410 Unspecified street and highway as the place of occurrence of the external cause: Secondary | ICD-10-CM | POA: Insufficient documentation

## 2017-06-10 DIAGNOSIS — Y9389 Activity, other specified: Secondary | ICD-10-CM | POA: Insufficient documentation

## 2017-06-10 DIAGNOSIS — Z79899 Other long term (current) drug therapy: Secondary | ICD-10-CM | POA: Insufficient documentation

## 2017-06-10 MED ORDER — HYDROCODONE-ACETAMINOPHEN 5-325 MG PO TABS
1.0000 | ORAL_TABLET | Freq: Once | ORAL | Status: AC
  Administered 2017-06-10: 1 via ORAL
  Filled 2017-06-10: qty 1

## 2017-06-10 MED ORDER — METHOCARBAMOL 500 MG PO TABS: 500.0000 mg | ORAL_TABLET | Freq: Two times a day (BID) | ORAL | 0 refills | Status: DC
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
## 2017-12-02 ENCOUNTER — Emergency Department (HOSPITAL_COMMUNITY)
Admission: EM | Admit: 2017-12-02 | Discharge: 2017-12-02 | Disposition: A | Discharge status: Home / Self Care | Payer: Self-pay | Attending: Emergency Medicine | Admitting: Emergency Medicine

## 2017-12-02 DIAGNOSIS — N529 Male erectile dysfunction, unspecified: Secondary | ICD-10-CM | POA: Insufficient documentation

## 2017-12-02 DIAGNOSIS — Z79899 Other long term (current) drug therapy: Secondary | ICD-10-CM | POA: Insufficient documentation

## 2017-12-02 DIAGNOSIS — I10 Essential (primary) hypertension: Secondary | ICD-10-CM | POA: Insufficient documentation

## 2017-12-02 DIAGNOSIS — Z87891 Personal history of nicotine dependence: Secondary | ICD-10-CM | POA: Insufficient documentation

## 2017-12-02 DIAGNOSIS — E119 Type 2 diabetes mellitus without complications: Secondary | ICD-10-CM | POA: Insufficient documentation

## 2017-12-02 LAB — CBC WITH DIFFERENTIAL/PLATELET
Abs Immature Granulocytes: 0.01 10*3/uL (ref 0.00–0.07)
Basophils Absolute: 0 10*3/uL (ref 0.0–0.1)
Basophils Relative: 0 %
Eosinophils Absolute: 0 10*3/uL (ref 0.0–0.5)
Eosinophils Relative: 1 %
HCT: 42.7 % (ref 39.0–52.0)
Hemoglobin: 14 g/dL (ref 13.0–17.0)
Immature Granulocytes: 0 %
Lymphocytes Relative: 41 %
Lymphs Abs: 2.7 10*3/uL (ref 0.7–4.0)
MCH: 31.3 pg (ref 26.0–34.0)
MCHC: 32.8 g/dL (ref 30.0–36.0)
MCV: 95.3 fL (ref 80.0–100.0)
Monocytes Absolute: 0.7 10*3/uL (ref 0.1–1.0)
Monocytes Relative: 11 %
Neutro Abs: 3.1 10*3/uL (ref 1.7–7.7)
Neutrophils Relative %: 47 %
Platelets: 233 10*3/uL (ref 150–400)
RBC: 4.48 MIL/uL (ref 4.22–5.81)
RDW: 13.2 % (ref 11.5–15.5)
WBC: 6.7 10*3/uL (ref 4.0–10.5)
nRBC: 0 % (ref 0.0–0.2)

## 2017-12-02 LAB — URINALYSIS, ROUTINE W REFLEX MICROSCOPIC
Bilirubin Urine: NEGATIVE
Glucose, UA: NEGATIVE mg/dL
Hgb urine dipstick: NEGATIVE
Ketones, ur: NEGATIVE mg/dL
Leukocytes, UA: NEGATIVE
Nitrite: NEGATIVE
Protein, ur: NEGATIVE mg/dL
Specific Gravity, Urine: 1.019 (ref 1.005–1.030)
pH: 5 (ref 5.0–8.0)

## 2017-12-02 LAB — BASIC METABOLIC PANEL
Anion gap: 9 (ref 5–15)
BUN: 14 mg/dL (ref 6–20)
CO2: 26 mmol/L (ref 22–32)
Calcium: 9.6 mg/dL (ref 8.9–10.3)
Chloride: 107 mmol/L (ref 98–111)
Creatinine, Ser: 1.07 mg/dL (ref 0.61–1.24)
GFR calc Af Amer: >60 mL/min (ref >60)
GFR calc non Af Amer: >60 mL/min (ref >60)
Glucose, Bld: 75 mg/dL (ref 70–99)
Potassium: 3.7 mmol/L (ref 3.5–5.1)
Sodium: 142 mmol/L (ref 135–145)

## 2017-12-02 LAB — I-STAT CHEM 8, ED
BUN: 12 mg/dL (ref 6–20)
Calcium, Ion: 1.24 mmol/L (ref 1.15–1.40)
Chloride: 106 mmol/L (ref 98–111)
Creatinine, Ser: 1.1 mg/dL (ref 0.61–1.24)
Glucose, Bld: 77 mg/dL (ref 70–99)
HCT: 41 % (ref 39.0–52.0)
Hemoglobin: 13.9 g/dL (ref 13.0–17.0)
Potassium: 4 mmol/L (ref 3.5–5.1)
Sodium: 143 mmol/L (ref 135–145)
TCO2: 28 mmol/L (ref 22–32)

## 2017-12-02 NOTE — ED Notes (Signed)
ED Provider at bedside. 

## 2017-12-02 NOTE — ED Provider Notes (Signed)
[facility] DEPT Provider Note   CSN: [id number] Arrival date & time: 12/02/17  1640     History   Chief Complaint Chief Complaint  Patient presents with  . Male GU Problem    HPI CAL [name] is a 47 y.o. male with history of erectile dysfunction, GERD, non-Hodgkin's lymphoma, hyperlipidemia, hypertension, sarcoidosis presents for evaluation of acute onset, persistent erectile dysfunction for 3 days.  He states that he has had difficulty obtaining and maintaining an erection during this amount of time.  He states that he is taken an over-the-counter "Rhino pill "without improvement.  He states that he has had similar issues several years ago related to radiation therapy to the right groin for non-Hodgkin's lymphoma.  He states that this was treated in 2001 and he has not been to see a primary care physician since then.  He does note 30 pound weight loss in the past year but states that he has been having some difficulty due to separation from his wife and this time.  He states that he may go several days without eating sometimes.  He also notes night sweats intermittently for several years which has not worsened but has been more persistent over the past month.  Denies shortness of breath, chest pain, abdominal pain, nausea, vomiting, urethral discharge, urinary urgency, frequency, dysuria, hematuria, testicular pain or swelling.  He is a current smoker of approximately half a pack of cigarettes daily.  The history is provided by the patient.    Past Medical History:  Diagnosis Date  . ED (erectile dysfunction)   . GERD (gastroesophageal reflux disease)   . History of Hodgkin's disease   . Hyperlipidemia   . Hypertension   . Sarcoid     Patient Active Problem List   Diagnosis Date Noted  . 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 Presence of retained hardware 06/30/2014  . Foreign body of elbow with  infection 06/30/2014  . Infection of elbow ([place]) 06/30/2014  . Tobacco abuse 02/01/2011  . Dyspnea 02/01/2011  . [place] DISEASE 02/03/2008  . DM 02/03/2008  . Sarcoidosis 10/23/2007  . CHEST PAIN 10/23/2007    Past Surgical History:  Procedure Laterality Date  . arm surgery Left 2001  . HARDWARE REMOVAL Left 06/30/2014   Procedure: HARDWARE REMOVAL, left elbow;  Surgeon: Dorna Leitz, MD;  Location: [facility];  Service: Orthopedics;  Laterality: Left;  . HERNIA REPAIR    . INCISION AND DRAINAGE ABSCESS Left 06/30/2014   Procedure: INCISION AND DRAINAGE ABSCESS, left elbow;  Surgeon: Dorna Leitz, MD;  Location: [facility];  Service: Orthopedics;  Laterality: Left;  . LUNG BIOPSY    . TONSILLECTOMY          Home Medications    Prior to Admission medications   Medication Sig Start Date End Date Taking? Authorizing Provider  methocarbamol (ROBAXIN) 500 MG tablet Take 1 tablet (500 mg total) by mouth 2 (two) times daily. 06/10/17   Volanda Napoleon, PA-C  traZODone (DESYREL) 100 MG tablet Take 1 tablet (100 mg total) by mouth at bedtime. 02/14/17   Patrecia Pour, NP    Family History Family History  Problem Relation Age of Onset  . Hypertension Mother   . Hypertension Father   . Cancer Other   . Diabetes Brother     Social History Social History   Tobacco Use  . Smoking status: Former Smoker    Types: Cigarettes  . Smokeless  tobacco: Never Used  . Tobacco comment: stop smoking cigarettes in 2015  Substance Use Topics  . Alcohol use: Yes    Comment: rare  . Drug use: Yes    Types: Marijuana    Comment: occasional     Allergies   Patient has no known allergies.   Review of Systems Review of Systems  Constitutional: Positive for diaphoresis (night sweats) and unexpected weight change.  Respiratory: Negative for shortness of breath.   Cardiovascular: Negative for chest pain.  Gastrointestinal: Negative for abdominal pain, nausea and vomiting.  Genitourinary: Negative  for discharge, dysuria, frequency, hematuria, penile pain and penile swelling.       +erectile dysfunction  Musculoskeletal: Negative for back pain.  All other systems reviewed and are negative.    Physical Exam Updated Vital Signs BP (!) 128/102   Pulse 72   Temp 98.7 F (37.1 C) (Oral)   Resp 14   SpO2 100%   Physical Exam  Constitutional: He appears well-developed and well-nourished. No distress.  Resting comfortably in chair  HENT:  Head: Normocephalic and atraumatic.  Eyes: Conjunctivae are normal. Right eye exhibits no discharge. Left eye exhibits no discharge.  Neck: No JVD present. No tracheal deviation present.  Cardiovascular: Normal rate, regular rhythm, normal heart sounds and intact distal pulses.  2+ radial and DP/PT pulses bilaterally, Homans sign absent bilaterally, no lower extremity edema, no palpable cords, compartments are soft   Pulmonary/Chest: Effort normal and breath sounds normal.  Abdominal: Soft. Bowel sounds are normal. He exhibits no distension and no mass. There is no tenderness. There is no guarding.  Genitourinary:  Genitourinary Comments: Examination performed in the presence of a chaperone.  Well-healed surgical scar to the right groin from lymph node removal surgery in 2001.  No inguinal lymphadenopathy noted.  No tenderness to palpation of the testicles or scrotal swelling.  No penile pain.  Musculoskeletal: He exhibits no edema.  Neurological: He is alert.  Skin: Skin is warm and dry. No erythema.  Psychiatric: He has a normal mood and affect. His behavior is normal.  Nursing note and vitals reviewed.    ED Treatments / Results  Labs (all labs ordered are listed, but only abnormal results are displayed) Labs Reviewed  URINALYSIS, ROUTINE W REFLEX MICROSCOPIC  CBC WITH DIFFERENTIAL/PLATELET  BASIC METABOLIC PANEL  I-STAT CHEM 8, ED    EKG None  Radiology No results found.  Procedures Procedures (including critical care  time)  Medications Ordered in ED Medications - No data to display   Initial Impression / Assessment and Plan / ED Course  I have reviewed the triage vital signs and the nursing notes.  Pertinent labs & imaging results that were available during my care of the patient were reviewed by me and considered in my medical decision making (see chart for details).     Patient presenting with chief complaint of erectile dysfunction for 3 days.  He is afebrile, vital signs significant for mild hypertension on initial assessment with improvement on reevaluation.  He is overall well-appearing.  His medical history is significant for erectile dysfunction several years ago when he was being treated for non-Hodgkin's lymphoma with radiation and surgical removal of affected lymph nodes in the right groin.  This did improve with Viagra and eventually resolved.  Physical examination is entirely unremarkable with no evidence of lymphadenopathy, soft nontender abdomen, and unremarkable GU examination.  UA does not suggest UTI, nephrolithiasis, or STD.  Lab work reviewed by me is entirely  unremarkable with no leukocytosis, unremarkable differential, and no metabolic derangements.  Creatinine within normal limits.  Recommend follow-up with urology or PCP for reevaluation and he was given resources for follow-up on an outpatient basis.  Also encouraged smoking cessation.  Discussed strict ED return precautions.Pt verbalized understanding of and agreement with plan and is safe for discharge home at this time.  Discussed with Dr. Alvino Chapel who agrees with assessment and plan at this time.  Final Clinical Impressions(s) / ED Diagnoses   Final diagnoses:  Erectile dysfunction, unspecified erectile dysfunction type    ED Discharge Orders    None       Debroah Baller 12/02/17 2134    Davonna Belling, MD 12/02/17 2332

## 2017-12-02 NOTE — Discharge Instructions (Addendum)
Drink plenty fluids and get plenty of rest.  I do recommend that you quit smoking.  Follow-up with a primary care physician urologist for reevaluation of your symptoms.   You can call [facility] and wellness and tell them you were referred from the emergency department.  They will add you to their waiting list.  I have also attached additional resources for follow-up with a primary care physician with no medical insurance.  Return to the emergency department if any concerning signs or symptoms develop such as fevers, chest pain, shortness of breath, abdominal pain or swelling, or passing out.

## 2017-12-02 NOTE — ED Triage Notes (Signed)
Patient presents today stating that he has been unable to maintain a penile erection x 3 days. Patient denies any urinary symptoms, abdominal pain or penile complaints. Pain 0/10. Patient has attempted to take OTC supplements with no relief.

## 2020-12-30 ENCOUNTER — Encounter (HOSPITAL_BASED_OUTPATIENT_CLINIC_OR_DEPARTMENT_OTHER): Payer: Self-pay | Admitting: Emergency Medicine

## 2020-12-30 ENCOUNTER — Emergency Department (HOSPITAL_BASED_OUTPATIENT_CLINIC_OR_DEPARTMENT_OTHER)
Admission: EM | Admit: 2020-12-30 | Discharge: 2020-12-30 | Disposition: A | Discharge status: Home / Self Care | Payer: Self-pay | Attending: Emergency Medicine | Admitting: Emergency Medicine

## 2020-12-30 ENCOUNTER — Other Ambulatory Visit: Payer: Self-pay

## 2020-12-30 ENCOUNTER — Emergency Department (HOSPITAL_BASED_OUTPATIENT_CLINIC_OR_DEPARTMENT_OTHER): Payer: Self-pay

## 2020-12-30 DIAGNOSIS — E119 Type 2 diabetes mellitus without complications: Secondary | ICD-10-CM | POA: Insufficient documentation

## 2020-12-30 DIAGNOSIS — Z20822 Contact with and (suspected) exposure to covid-19: Secondary | ICD-10-CM | POA: Insufficient documentation

## 2020-12-30 DIAGNOSIS — R197 Diarrhea, unspecified: Secondary | ICD-10-CM | POA: Insufficient documentation

## 2020-12-30 DIAGNOSIS — Z87891 Personal history of nicotine dependence: Secondary | ICD-10-CM | POA: Insufficient documentation

## 2020-12-30 DIAGNOSIS — R519 Headache, unspecified: Secondary | ICD-10-CM | POA: Insufficient documentation

## 2020-12-30 DIAGNOSIS — Z8571 Personal history of Hodgkin lymphoma: Secondary | ICD-10-CM | POA: Insufficient documentation

## 2020-12-30 DIAGNOSIS — R6883 Chills (without fever): Secondary | ICD-10-CM | POA: Insufficient documentation

## 2020-12-30 DIAGNOSIS — R531 Weakness: Secondary | ICD-10-CM | POA: Insufficient documentation

## 2020-12-30 DIAGNOSIS — I10 Essential (primary) hypertension: Secondary | ICD-10-CM | POA: Insufficient documentation

## 2020-12-30 DIAGNOSIS — Z79899 Other long term (current) drug therapy: Secondary | ICD-10-CM | POA: Insufficient documentation

## 2020-12-30 DIAGNOSIS — R112 Nausea with vomiting, unspecified: Secondary | ICD-10-CM | POA: Insufficient documentation

## 2020-12-30 LAB — CBC WITH DIFFERENTIAL/PLATELET
Abs Immature Granulocytes: 0.01 10*3/uL (ref 0.00–0.07)
Basophils Absolute: 0 10*3/uL (ref 0.0–0.1)
Basophils Relative: 1 %
Eosinophils Absolute: 0 10*3/uL (ref 0.0–0.5)
Eosinophils Relative: 1 %
HCT: 42.6 % (ref 39.0–52.0)
Hemoglobin: 14.8 g/dL (ref 13.0–17.0)
Immature Granulocytes: 0 %
Lymphocytes Relative: 43 %
Lymphs Abs: 2.9 10*3/uL (ref 0.7–4.0)
MCH: 32.1 pg (ref 26.0–34.0)
MCHC: 34.7 g/dL (ref 30.0–36.0)
MCV: 92.4 fL (ref 80.0–100.0)
Monocytes Absolute: 0.6 10*3/uL (ref 0.1–1.0)
Monocytes Relative: 9 %
Neutro Abs: 3.1 10*3/uL (ref 1.7–7.7)
Neutrophils Relative %: 46 %
Platelets: 253 10*3/uL (ref 150–400)
RBC: 4.61 MIL/uL (ref 4.22–5.81)
RDW: 13.2 % (ref 11.5–15.5)
WBC: 6.7 10*3/uL (ref 4.0–10.5)
nRBC: 0 % (ref 0.0–0.2)

## 2020-12-30 LAB — COMPREHENSIVE METABOLIC PANEL
ALT: 11 U/L (ref 0–44)
AST: 17 U/L (ref 15–41)
Albumin: 3.8 g/dL (ref 3.5–5.0)
Alkaline Phosphatase: 83 U/L (ref 38–126)
Anion gap: 7 (ref 5–15)
BUN: 21 mg/dL — ABNORMAL HIGH (ref 6–20)
CO2: 25 mmol/L (ref 22–32)
Calcium: 9 mg/dL (ref 8.9–10.3)
Chloride: 105 mmol/L (ref 98–111)
Creatinine, Ser: 1.19 mg/dL (ref 0.61–1.24)
GFR, Estimated: >60 mL/min (ref >60)
Glucose, Bld: 81 mg/dL (ref 70–99)
Potassium: 3.8 mmol/L (ref 3.5–5.1)
Sodium: 137 mmol/L (ref 135–145)
Total Bilirubin: 0.8 mg/dL (ref 0.3–1.2)
Total Protein: 6.6 g/dL (ref 6.5–8.1)

## 2020-12-30 LAB — RESP PANEL BY RT-PCR (FLU A&B, COVID) ARPGX2
Influenza A by PCR: NEGATIVE
Influenza B by PCR: NEGATIVE
SARS Coronavirus 2 by RT PCR: NEGATIVE

## 2020-12-30 LAB — LIPASE, BLOOD: Lipase: 97 U/L — ABNORMAL HIGH (ref 11–51)

## 2020-12-30 MED ORDER — DIPHENHYDRAMINE HCL 50 MG/ML IJ SOLN
50.0000 mg | Freq: Once | INTRAMUSCULAR | Status: AC
  Administered 2020-12-30: 25 mg via INTRAVENOUS
  Filled 2020-12-30: qty 1

## 2020-12-30 MED ORDER — ONDANSETRON HCL 4 MG PO TABS: 4.0000 mg | ORAL_TABLET | Freq: Four times a day (QID) | ORAL | 0 refills | Status: DC

## 2020-12-30 MED ORDER — METOCLOPRAMIDE HCL 5 MG/ML IJ SOLN
10.0000 mg | Freq: Once | INTRAMUSCULAR | Status: AC
  Administered 2020-12-30: 10 mg via INTRAVENOUS
  Filled 2020-12-30: qty 2

## 2020-12-30 MED ORDER — ONDANSETRON HCL 4 MG/2ML IJ SOLN
4.0000 mg | Freq: Once | INTRAMUSCULAR | Status: AC
  Administered 2020-12-30: 4 mg via INTRAVENOUS
  Filled 2020-12-30: qty 2

## 2020-12-30 MED ORDER — SODIUM CHLORIDE 0.9 % IV BOLUS
1000.0000 mL | Freq: Once | INTRAVENOUS | Status: AC
  Administered 2020-12-30: 1000 mL via INTRAVENOUS

## 2020-12-30 NOTE — ED Notes (Signed)
Pt c/o pain above R AC PIV site, no redness or swelling noted. PIV removed per pt request. L FA PIV started, fluid started back at reduced rate 550mL/hr. Warm pack applied to right upper arm, pt states pain improved.

## 2020-12-30 NOTE — ED Provider Notes (Signed)
[facility] EMERGENCY DEPARTMENT Provider Note   CSN: [id number] Arrival date & time: 12/30/20  1750     History Chief Complaint  Patient presents with   Emesis    [name] is a 50 y.o. male.  HPI  Patient with medical history of Hodgkin's, with chemo and radiation in 2001 no follow-up since 2005, sarcoidosis, reflux disease presents due to nausea, vomiting, diarrhea.  He is also having chills at home and feeling weak.  The symptoms started 2 to 3 days ago, they have been constant and not improving.  He also reports has been having increasingly frequent headaches that are more severe than his typical headaches.  Reports he is having intermittent blurry vision, feels weak on the right side of his thigh.  Denies any diplopia, not dizzy.  No chest pain or shortness of breath, does not take medicine for any medical conditions.  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   Presence of retained hardware 06/30/2014   Foreign body of elbow with infection 06/30/2014   Infection of elbow ([place]) 06/30/2014   Tobacco abuse 02/01/2011   Dyspnea 02/01/2011   HODGKIN'S DISEASE 02/03/2008   DM 02/03/2008   Sarcoidosis 10/23/2007   CHEST PAIN 10/23/2007    Past Surgical History:  Procedure Laterality Date   arm surgery Left 2001   HARDWARE REMOVAL Left 06/30/2014   Procedure: HARDWARE REMOVAL, left elbow;  Surgeon: Dorna Leitz, MD;  Location: [facility];  Service: Orthopedics;  Laterality: Left;   HERNIA REPAIR     INCISION AND DRAINAGE ABSCESS Left 06/30/2014   Procedure: INCISION AND DRAINAGE ABSCESS, left elbow;  Surgeon: Dorna Leitz, MD;  Location: [facility];  Service: Orthopedics;  Laterality: Left;    LUNG BIOPSY     TONSILLECTOMY         Family History  Problem Relation Age of Onset   Hypertension Mother    Hypertension Father    Cancer Other    Diabetes Brother     Social History   Tobacco Use   Smoking status: Former    Types: Cigarettes   Smokeless tobacco: Never   Tobacco comments:    stop smoking cigarettes in 2015  Vaping Use   Vaping Use: Never used  Substance Use Topics   Alcohol use: Yes    Comment: rare   Drug use: Yes    Types: Marijuana    Comment: occasional    Home Medications Prior to Admission medications   Medication Sig Start Date End Date Taking? Authorizing Provider  ondansetron (ZOFRAN) 4 MG tablet Take 1 tablet (4 mg total) by mouth every 6 (six) hours. 12/30/20  Yes Sherrill Raring, PA-C  methocarbamol (ROBAXIN) 500 MG tablet Take 1 tablet (500 mg total) by mouth 2 (two) times daily. 06/10/17   Volanda Napoleon, PA-C  traZODone (DESYREL) 100 MG tablet Take 1 tablet (100 mg total) by mouth at bedtime. 02/14/17   Patrecia Pour, NP    Allergies    Patient has no known allergies.  Review of Systems   Review of Systems  Constitutional:  Positive for chills. Negative for fever.  Eyes:  Positive for visual disturbance.  Respiratory:  Negative for shortness of breath.   Cardiovascular:  Negative for chest pain.  Gastrointestinal:  Positive for diarrhea, nausea and vomiting. Negative for abdominal pain.  Genitourinary:  Negative for dysuria and hematuria.  Musculoskeletal:  Negative for back pain.  Neurological:  Positive for headaches. Negative for dizziness, syncope, speech difficulty and numbness.   Physical Exam Updated Vital Signs BP 125/79   Pulse 61   Temp 99 F (37.2 C) (Oral)   Resp 18   SpO2 94%   Physical Exam Vitals and nursing note reviewed. Exam conducted with a chaperone present.  Constitutional:      Appearance: Normal appearance.  HENT:     Head: Normocephalic and atraumatic.     Mouth/Throat:     Mouth: Mucous  membranes are dry.  Eyes:     General: No scleral icterus.       Right eye: No discharge.        Left eye: No discharge.     Extraocular Movements: Extraocular movements intact.     Pupils: Pupils are equal, round, and reactive to light.  Cardiovascular:     Rate and Rhythm: Normal rate and regular rhythm.     Pulses: Normal pulses.     Heart sounds: Normal heart sounds. No murmur heard.   No friction rub. No gallop.  Pulmonary:     Effort: Pulmonary effort is normal. No respiratory distress.     Breath sounds: Normal breath sounds.  Abdominal:     General: Abdomen is flat. Bowel sounds are normal. There is no distension.     Palpations: Abdomen is soft.     Tenderness: There is no abdominal tenderness.  Skin:    General: Skin is warm and dry.     Coloration: Skin is not jaundiced.  Neurological:     Mental Status: He is alert. Mental status is at baseline.     Coordination: Coordination normal.     Comments: CN III-XII grossly in tact. No nystagmus. Grip strength equal bilaterally.    ED Results / Procedures / Treatments   Labs (all labs ordered are listed, but only abnormal results are displayed) Labs Reviewed  COMPREHENSIVE METABOLIC PANEL - Abnormal; Notable for the following components:      Result Value   BUN 21 (*)    All other components within normal limits  LIPASE, BLOOD - Abnormal; Notable for the following components:   Lipase 97 (*)    All other components within normal limits  RESP PANEL BY RT-PCR (FLU A&B, COVID) ARPGX2  CBC WITH DIFFERENTIAL/PLATELET  URINALYSIS, ROUTINE W REFLEX MICROSCOPIC    EKG None  Radiology CT Head Wo Contrast  Result Date: 12/30/2020 CLINICAL DATA:  Sudden severe headache, vomiting and chills, diarrhea EXAM: CT HEAD WITHOUT CONTRAST TECHNIQUE: Contiguous axial images were obtained from the base of the skull through the vertex without intravenous contrast. COMPARISON:  01/04/2008 FINDINGS: Brain: No acute infarct or hemorrhage.  Lateral ventricles and midline structures are unremarkable. No acute extra-axial fluid collections. No mass effect. Vascular: No hyperdense vessel or unexpected calcification. Skull: Normal. Negative for fracture or focal lesion. Sinuses/Orbits: Minimal polypoid mucosal thickening within the ethmoid air cells. Remaining sinuses are clear. Other: None. IMPRESSION: 1. No acute intracranial process. Electronically Signed   By: Randa Ngo M.D.   On: 12/30/2020 20:35    Procedures Procedures   Medications Ordered in ED Medications  sodium chloride 0.9 % bolus 1,000 mL (1,000 mLs Intravenous New Bag/Given 12/30/20 1945)  ondansetron [facility]) injection 4 mg (4 mg Intravenous Given 12/30/20 1945)  metoCLOPramide (REGLAN) injection 10 mg (10 mg Intravenous Given 12/30/20 2018)  diphenhydrAMINE (BENADRYL) injection 50 mg (25 mg Intravenous Given 12/30/20 2018)    ED Course  I have reviewed the triage vital signs and the nursing notes.  Pertinent labs & imaging results that were available during my care of the patient were reviewed by me and considered in my medical decision making (see chart for details).  Clinical Course as of 12/30/20 2220  Fri Dec 30, 2020  2104 Lipase, blood(!) Elevated lipase, improved from previous.  Patient is adamant he is not having any abdominal pain, serial abdominal exams have been negative.  Do not suspect pancreatitis. [HS]  2104 CT Head Wo Contrast No acute intracranial process as a cause of his intermittent blurry vision. [HS]  2105 CBC with Differential No leukocytosis, no anemia [HS]  2105 Comprehensive metabolic panel(!) No gross electrolyte derangement, no LFT elevation concerning for biliary process or hepatitis.  Creatinine at baseline, no AKI [HS]    Clinical Course User Index [HS] Sherrill Raring, PA-C   MDM Rules/Calculators/A&P                           Stable vitals, nontoxic-appearing.  Neck symptoms likely consistent with gastroenteritis, no  abdominal pain or tenderness.  Doubt acute abdomen, is not ill-appearing or hypoxic or tachycardic.  Also no chest pain.  We will check basic labs given generalized weakness.   The headache is more frequent and more severe than normal, this in conjunction with the blurry vision is concerning for worsening etiology.  CT head ordered, that was negative for [facility] or intracranial mass.  Please see the course interpretation of labs as they pertain to the differential.  Patient is stable, symptoms have improved and his headache has resolved.  Vitals have been stable during this period of observation he has no gross electrolyte derangement from fluid loss.  Abdomen serial exams have been benign, at this point I think patient is stable for discharge.  I did put in a consult for social work to help patient establish care with a primary care provider.  Zofran ordered for nausea, patient discharged in stable condition with return precautions.  Final Clinical Impression(s) / ED Diagnoses Final diagnoses:  Nausea vomiting and diarrhea    Rx / DC Orders ED Discharge Orders          Ordered    ondansetron (ZOFRAN) 4 MG tablet  Every 6 hours        12/30/20 2219             Sherrill Raring, PA-C 12/30/20 2220    Lucrezia Starch, MD 12/31/20 [phone]

## 2020-12-30 NOTE — ED Triage Notes (Signed)
Vomitng and  chills since wed night  has been having diarrhea

## 2020-12-30 NOTE — Discharge Instructions (Signed)
Your work-up today was reassuring, you may have a stomach virus.  I prescribed you some nausea medicine you can take as needed.  You are written off work until Tuesday next week.  Please set up care with a primary care doctor you should receive a call from the social worker on Monday or Tuesday next week.  If you do not please call the [facility] to establish care.  Return to the ED if things change or worsen.

## 2021-04-20 ENCOUNTER — Encounter (HOSPITAL_BASED_OUTPATIENT_CLINIC_OR_DEPARTMENT_OTHER): Payer: Self-pay | Admitting: Emergency Medicine

## 2021-04-20 ENCOUNTER — Other Ambulatory Visit: Payer: Self-pay

## 2021-04-20 ENCOUNTER — Emergency Department (HOSPITAL_BASED_OUTPATIENT_CLINIC_OR_DEPARTMENT_OTHER)
Admission: EM | Admit: 2021-04-20 | Discharge: 2021-04-20 | Disposition: A | Discharge status: Home / Self Care | Payer: Self-pay | Attending: Emergency Medicine | Admitting: Emergency Medicine

## 2021-04-20 DIAGNOSIS — Z20822 Contact with and (suspected) exposure to covid-19: Secondary | ICD-10-CM | POA: Insufficient documentation

## 2021-04-20 DIAGNOSIS — J069 Acute upper respiratory infection, unspecified: Secondary | ICD-10-CM | POA: Insufficient documentation

## 2021-04-20 LAB — RESP PANEL BY RT-PCR (FLU A&B, COVID) ARPGX2
Influenza A by PCR: NEGATIVE
Influenza B by PCR: NEGATIVE
SARS Coronavirus 2 by RT PCR: NEGATIVE

## 2021-04-20 MED ORDER — IBUPROFEN 400 MG PO TABS
400.0000 mg | ORAL_TABLET | Freq: Once | ORAL | Status: AC
  Administered 2021-04-20: 400 mg via ORAL
  Filled 2021-04-20: qty 1

## 2021-04-20 MED ORDER — ACETAMINOPHEN 500 MG PO TABS
1000.0000 mg | ORAL_TABLET | Freq: Once | ORAL | Status: AC
  Administered 2021-04-20: 1000 mg via ORAL
  Filled 2021-04-20: qty 2

## 2021-04-20 NOTE — ED Provider Notes (Addendum)
?[facility] EMERGENCY DEPARTMENT ?Provider Note ? ? ?CSN: [id number] ?Arrival date & time: 04/20/21  0031 ? ?  ? ?History ? ?Chief Complaint  ?Patient presents with  ? URI  ? ? ?[name] is a 51 y.o. male. ? ?The history is provided by the patient.  ?URI ?Presenting symptoms: congestion, cough and sore throat   ?Cough:  ?  Cough characteristics:  Non-productive ?  Severity:  Mild ?  Onset quality:  Gradual ?  Timing:  Sporadic ?  Progression:  Unchanged ?  Chronicity:  New ?Severity:  Moderate ?Onset quality:  Gradual ?Duration:  2 days ?Timing:  Constant ?Progression:  Unchanged ?Chronicity:  New ?Relieved by:  Nothing ?Worsened by:  Nothing ?Ineffective treatments:  None tried ?Associated symptoms: no myalgias   ?Risk factors: sick contacts   ?Risk factors: not elderly   ? ?  ? ?Home Medications ?Prior to Admission medications   ?Medication Sig Start Date End Date Taking? Authorizing Provider  ?methocarbamol (ROBAXIN) 500 MG tablet Take 1 tablet (500 mg total) by mouth 2 (two) times daily. 06/10/17   Volanda Napoleon, PA-C  ?ondansetron (ZOFRAN) 4 MG tablet Take 1 tablet (4 mg total) by mouth every 6 (six) hours. 12/30/20   Sherrill Raring, PA-C  ?traZODone (DESYREL) 100 MG tablet Take 1 tablet (100 mg total) by mouth at bedtime. 02/14/17   Patrecia Pour, NP  ?   ? ?Allergies    ?Patient has no known allergies.   ? ?Review of Systems   ?Review of Systems  ?HENT:  Positive for congestion and sore throat.   ?Respiratory:  Positive for cough.   ?Cardiovascular:  Negative for chest pain.  ?Gastrointestinal:  Negative for abdominal pain.  ?Genitourinary:  Negative for difficulty urinating.  ?Musculoskeletal:  Negative for myalgias.  ?Neurological:  Negative for facial asymmetry.  ?All other systems reviewed and are negative. ? ?Physical Exam ?Updated Vital Signs ?BP (!) 170/104 (BP Location: Right Arm)   Pulse (!) 57   Temp 98.3 ?F (36.8 ?C) (Oral)   Resp 20   Ht '5\' 5"'$  (1.651 m)   Wt 66 kg   SpO2  100%   BMI 24.23 kg/m?  ?Physical Exam ?Vitals and nursing note reviewed.  ?Constitutional:   ?   General: He is not in acute distress. ?   Appearance: Normal appearance.  ?HENT:  ?   Head: Normocephalic and atraumatic.  ?   Nose: Nose normal.  ?   Mouth/Throat:  ?   Mouth: Mucous membranes are moist.  ?   Pharynx: Oropharynx is clear. No oropharyngeal exudate or posterior oropharyngeal erythema.  ?Eyes:  ?   Conjunctiva/sclera: Conjunctivae normal.  ?   Pupils: Pupils are equal, round, and reactive to light.  ?Cardiovascular:  ?   Rate and Rhythm: Normal rate and regular rhythm.  ?   Pulses: Normal pulses.  ?   Heart sounds: Normal heart sounds.  ?Pulmonary:  ?   Effort: Pulmonary effort is normal.  ?   Breath sounds: Normal breath sounds.  ?Abdominal:  ?   General: Abdomen is flat. Bowel sounds are normal.  ?   Palpations: Abdomen is soft.  ?   Tenderness: There is no abdominal tenderness. There is no guarding.  ?Musculoskeletal:     ?   General: Normal range of motion.  ?   Cervical back: Normal range of motion and neck supple.  ?Lymphadenopathy:  ?   Cervical: No cervical adenopathy.  ?Skin: ?  General: Skin is warm and dry.  ?   Capillary Refill: Capillary refill takes less than 2 seconds.  ?Neurological:  ?   General: No focal deficit present.  ?   Mental Status: He is alert and oriented to person, place, and time.  ?   Deep Tendon Reflexes: Reflexes normal.  ?Psychiatric:     ?   Mood and Affect: Mood normal.     ?   Behavior: Behavior normal.  ? ? ?ED Results / Procedures / Treatments   ?Labs ?(all labs ordered are listed, but only abnormal results are displayed) ?Labs Reviewed  ?RESP PANEL BY RT-PCR (FLU A&B, COVID) ARPGX2  ? ? ?EKG ?None ? ?Radiology ?No results found. ? ?Procedures ?Procedures  ? ? ?Medications Ordered in ED ?Medications  ?acetaminophen (TYLENOL) tablet 1,000 mg (has no administration in time range)  ?ibuprofen (ADVIL) tablet 400 mg (has no administration in time range)  ? ? ?ED Course/  Medical Decision Making/ A&P ?  ?                        ?Medical Decision Making ?URI x 2 days GF trested negative for covid but work sent him in  ? ?Amount and/or Complexity of Data Reviewed ?External Data Reviewed: notes. ?   Details: previous notes reviewed ?Labs: ordered. ?   Details: all labs reviewed by me:  negative covid and flu ? ?Risk ?OTC drugs. ?Prescription drug management. ?Risk Details: Alternate tylenol and ibuprofen for pain.  Based on centor score there is no indication for further testing or treatment.  This is a viral uri.  Follow up with PMD for ongoing care ? ? ?Final Clinical Impression(s) / ED Diagnoses ?Final diagnoses:  ?None  ? ?  ?Return for intractable cough, coughing up blood, fevers > 100.4 unrelieved by medication, shortness of breath, intractable vomiting, chest pain, shortness of breath, weakness, numbness, changes in speech, facial asymmetry, abdominal pain, passing out, Inability to tolerate liquids or food, cough, altered mental status or any concerns. No signs of systemic illness or infection. The patient is nontoxic-appearing on exam and vital signs are within normal limits.  ?I have reviewed the triage vital signs and the nursing notes. Pertinent labs & imaging results that were available during my care of the patient were reviewed by me and considered in my medical decision making (see chart for details). After history, exam, and medical workup I feel the patient has been appropriately medically screened and is safe for discharge home. Pertinent diagnoses were discussed with the patient. Patient was given return precautions.  ?  ?Rx / DC Orders ?ED Discharge Orders   ? ? None  ? ?  ? ? ? ? ?  ?Alanys Godino, MD ?04/20/21 0137 ? ?

## 2021-04-20 NOTE — ED Triage Notes (Signed)
PT states ichy throat on Monday, the chills fever cough and sinus congestion. Work sent home request eval.  ?

## 2021-11-08 ENCOUNTER — Other Ambulatory Visit: Payer: Self-pay

## 2021-11-08 ENCOUNTER — Emergency Department (HOSPITAL_BASED_OUTPATIENT_CLINIC_OR_DEPARTMENT_OTHER): Payer: Self-pay

## 2021-11-08 ENCOUNTER — Encounter (HOSPITAL_BASED_OUTPATIENT_CLINIC_OR_DEPARTMENT_OTHER): Payer: Self-pay

## 2021-11-08 ENCOUNTER — Emergency Department (HOSPITAL_BASED_OUTPATIENT_CLINIC_OR_DEPARTMENT_OTHER)
Admission: EM | Admit: 2021-11-08 | Discharge: 2021-11-08 | Disposition: A | Discharge status: Home / Self Care | Payer: Self-pay | Attending: Emergency Medicine | Admitting: Emergency Medicine

## 2021-11-08 DIAGNOSIS — I10 Essential (primary) hypertension: Secondary | ICD-10-CM | POA: Insufficient documentation

## 2021-11-08 DIAGNOSIS — R112 Nausea with vomiting, unspecified: Secondary | ICD-10-CM | POA: Insufficient documentation

## 2021-11-08 DIAGNOSIS — R1084 Generalized abdominal pain: Secondary | ICD-10-CM | POA: Insufficient documentation

## 2021-11-08 DIAGNOSIS — R197 Diarrhea, unspecified: Secondary | ICD-10-CM | POA: Insufficient documentation

## 2021-11-08 LAB — COMPREHENSIVE METABOLIC PANEL
ALT: 9 U/L (ref 0–44)
AST: 16 U/L (ref 15–41)
Albumin: 4 g/dL (ref 3.5–5.0)
Alkaline Phosphatase: 87 U/L (ref 38–126)
Anion gap: 5 (ref 5–15)
BUN: 16 mg/dL (ref 6–20)
CO2: 26 mmol/L (ref 22–32)
Calcium: 8.9 mg/dL (ref 8.9–10.3)
Chloride: 108 mmol/L (ref 98–111)
Creatinine, Ser: 1.04 mg/dL (ref 0.61–1.24)
GFR, Estimated: >60 mL/min (ref >60)
Glucose, Bld: 95 mg/dL (ref 70–99)
Potassium: 3.7 mmol/L (ref 3.5–5.1)
Sodium: 139 mmol/L (ref 135–145)
Total Bilirubin: 0.7 mg/dL (ref 0.3–1.2)
Total Protein: 7 g/dL (ref 6.5–8.1)

## 2021-11-08 LAB — URINALYSIS, ROUTINE W REFLEX MICROSCOPIC
Glucose, UA: NEGATIVE mg/dL
Hgb urine dipstick: NEGATIVE
Ketones, ur: NEGATIVE mg/dL
Leukocytes,Ua: NEGATIVE
Nitrite: NEGATIVE
Protein, ur: NEGATIVE mg/dL
Specific Gravity, Urine: >=1.03 (ref 1.005–1.030)
pH: 5.5 (ref 5.0–8.0)

## 2021-11-08 LAB — CBC
HCT: 38.9 % — ABNORMAL LOW (ref 39.0–52.0)
Hemoglobin: 12.9 g/dL — ABNORMAL LOW (ref 13.0–17.0)
MCH: 31.2 pg (ref 26.0–34.0)
MCHC: 33.2 g/dL (ref 30.0–36.0)
MCV: 94 fL (ref 80.0–100.0)
Platelets: 231 10*3/uL (ref 150–400)
RBC: 4.14 MIL/uL — ABNORMAL LOW (ref 4.22–5.81)
RDW: 13.6 % (ref 11.5–15.5)
WBC: 6.9 10*3/uL (ref 4.0–10.5)
nRBC: 0 % (ref 0.0–0.2)

## 2021-11-08 LAB — LIPASE, BLOOD: Lipase: 88 U/L — ABNORMAL HIGH (ref 11–51)

## 2021-11-08 MED ORDER — IOHEXOL 300 MG/ML  SOLN
100.0000 mL | Freq: Once | INTRAMUSCULAR | Status: AC | PRN
  Administered 2021-11-08: 100 mL via INTRAVENOUS

## 2021-11-08 MED ORDER — ONDANSETRON HCL 4 MG PO TABS: 4.0000 mg | ORAL_TABLET | Freq: Four times a day (QID) | ORAL | 0 refills | Status: DC

## 2021-11-08 MED ORDER — DICYCLOMINE HCL 20 MG PO TABS: 20.0000 mg | ORAL_TABLET | Freq: Two times a day (BID) | ORAL | 0 refills | Status: DC

## 2021-11-08 NOTE — ED Triage Notes (Signed)
Pt c/o sickness x2 days to include vomiting, weakness, headache, and mild diarrhea. Pt endorses abd pain; does not feel right "funny feeling". Chills and unsure of fevers. No contact with anyone who is sick.

## 2021-11-08 NOTE — Discharge Instructions (Addendum)
You were evaluated in the Emergency Department and after careful evaluation, we did not find any emergent condition requiring admission or further testing in the hospital.  Take Zofran as needed for nausea and bentyl for the abdominal pain.   Please return to the Emergency Department if you experience any worsening of your condition.  We encourage you to follow up with a primary care provider.  Thank you for allowing [place] to be a part of your care.

## 2021-11-08 NOTE — ED Provider Notes (Signed)
[facility] EMERGENCY DEPARTMENT Provider Note   CSN: [id number] Arrival date & time: 11/08/21  0121     History  Chief Complaint  Patient presents with   Abdominal Pain    [name] is a 51 y.o. male.  HPI 51 year old male history of Hodgkin's lymphoma hypertension, hyperlipidemia, GERD presents to the ER with complaints of abdominal pain, nausea and vomiting and mild diarrhea for the last 2 days.  He reports that every time the weather changes he seems to get the symptoms several times a year.  He was complaining of nausea and abdominal pain at work and his boss wanted him to come to the ER to be evaluated.  He denies any dysuria or hematuria.  No flank pain.  Abdominal pain is mild and generalized.  Denies any chest pain or shortness of breath.  History of hernia repair.  Last bowel movement was this morning and loose.    Home Medications Prior to Admission medications   Medication Sig Start Date End Date Taking? Authorizing Provider  dicyclomine (BENTYL) 20 MG tablet Take 1 tablet (20 mg total) by mouth 2 (two) times daily. 11/08/21  Yes Sharyn Lull A, PA-C  ondansetron (ZOFRAN) 4 MG tablet Take 1 tablet (4 mg total) by mouth every 6 (six) hours. 11/08/21  Yes Garald Balding, PA-C  methocarbamol (ROBAXIN) 500 MG tablet Take 1 tablet (500 mg total) by mouth 2 (two) times daily. 06/10/17   Volanda Napoleon, PA-C  traZODone (DESYREL) 100 MG tablet Take 1 tablet (100 mg total) by mouth at bedtime. 02/14/17   Patrecia Pour, NP      Allergies    Patient has no known allergies.    Review of Systems   Review of Systems Ten systems reviewed and are negative for acute change, except as noted in the HPI.   Physical Exam Updated Vital Signs BP (!) 140/95 (BP Location: Left Arm)   Pulse 66   Temp 98.1 F (36.7 C) (Oral)   Resp 18   Ht '5\' 5"'$  (1.651 m)   Wt 72.6 kg   SpO2 99%   BMI 26.63 kg/m  Physical Exam Vitals and nursing note reviewed.   Constitutional:      General: He is not in acute distress.    Appearance: He is well-developed.  HENT:     Head: Normocephalic and atraumatic.  Eyes:     Conjunctiva/sclera: Conjunctivae normal.  Cardiovascular:     Rate and Rhythm: Normal rate and regular rhythm.     Heart sounds: No murmur heard. Pulmonary:     Effort: Pulmonary effort is normal. No respiratory distress.     Breath sounds: Normal breath sounds.  Abdominal:     Palpations: Abdomen is soft.     Tenderness: There is abdominal tenderness. There is no right CVA tenderness or left CVA tenderness.     Comments: Mild abdominal tenderness, generalized  Musculoskeletal:        General: No swelling.     Cervical back: Neck supple.  Skin:    General: Skin is warm and dry.     Capillary Refill: Capillary refill takes less than 2 seconds.  Neurological:     Mental Status: He is alert.  Psychiatric:        Mood and Affect: Mood normal.     ED Results / Procedures / Treatments   Labs (all labs ordered are listed, but only abnormal results are displayed) Labs Reviewed  LIPASE, BLOOD -  Abnormal; Notable for the following components:      Result Value   Lipase 88 (*)    All other components within normal limits  CBC - Abnormal; Notable for the following components:   RBC 4.14 (*)    Hemoglobin 12.9 (*)    HCT 38.9 (*)    All other components within normal limits  URINALYSIS, ROUTINE W REFLEX MICROSCOPIC - Abnormal; Notable for the following components:   Color, Urine AMBER (*)    Bilirubin Urine SMALL (*)    All other components within normal limits  COMPREHENSIVE METABOLIC PANEL    EKG EKG Interpretation  Date/Time:  Wednesday November 08 2021 01:49:24 EDT Ventricular Rate:  57 PR Interval:  168 QRS Duration: 82 QT Interval:  398 QTC Calculation: 387 R Axis:   73 Text Interpretation: Sinus bradycardia Otherwise normal ECG Confirmed by Veryl Speak [id number] on 11/08/2021 1:57:43 AM  Radiology CT ABDOMEN  PELVIS W CONTRAST  Result Date: 11/08/2021 CLINICAL DATA:  51 year old male with history of acute onset of nonlocalized abdominal pain with vomiting, weakness and headache with mild diarrhea for the past 2 days. EXAM: CT ABDOMEN AND PELVIS WITH CONTRAST TECHNIQUE: Multidetector CT imaging of the abdomen and pelvis was performed using the standard protocol following bolus administration of intravenous contrast. RADIATION DOSE REDUCTION: This exam was performed according to the departmental dose-optimization program which includes automated exposure control, adjustment of the mA and/or kV according to patient size and/or use of iterative reconstruction technique. CONTRAST:  190m OMNIPAQUE IOHEXOL 300 MG/ML  SOLN COMPARISON:  CT of the abdomen and pelvis 11/29/2014. FINDINGS: Lower chest: Unremarkable. Hepatobiliary: No suspicious cystic or solid hepatic lesions. No intra or extrahepatic biliary ductal dilatation. Gallbladder is normal in appearance. Pancreas: No pancreatic mass. No pancreatic ductal dilatation. No pancreatic or peripancreatic fluid collections or inflammatory changes. Spleen: Unremarkable. Adrenals/Urinary Tract: Mild multifocal cortical thinning in both kidneys. Several subcentimeter low-attenuation lesions are noted in the left kidney, too small to definitively characterize, but statistically likely tiny cysts (no imaging follow-up is recommended). No suspicious renal lesions. No hydroureteronephrosis. Urinary bladder is unremarkable in appearance. Bilateral adrenal glands are normal in appearance. Stomach/Bowel: The appearance of the stomach is normal. There is no pathologic dilatation of small bowel or colon. Normal appendix. Vascular/Lymphatic: Atherosclerosis of the abdominal aorta, without evidence of aneurysm or dissection noted in the abdominal or pelvic vasculature. No lymphadenopathy noted in the abdomen or pelvis. Reproductive: Prostate gland and seminal vesicles are unremarkable in  appearance. Other: No significant volume of ascites.  No pneumoperitoneum. Musculoskeletal: There are no aggressive appearing lytic or blastic lesions noted in the visualized portions of the skeleton. IMPRESSION: 1. No acute findings are noted in the abdomen or pelvis to account for the patient's symptoms. 2. Aortic atherosclerosis. 3. Additional incidental findings, as above. Electronically Signed   By: DVinnie LangtonM.D.   On: 11/08/2021 05:17    Procedures Procedures    Medications Ordered in ED Medications  iohexol (OMNIPAQUE) 300 MG/ML solution 100 mL (100 mLs Intravenous Contrast Given 11/08/21 0448)    ED Course/ Medical Decision Making/ A&P                           Medical Decision Making Amount and/or Complexity of Data Reviewed Labs: ordered. Radiology: ordered.  51year old presenting with abdominal pain, several days of diarrhea.  On arrival, he is well-appearing, no acute distress, resting comfortably in ER bed.  Slightly hypertensive with  a blood pressure 140/95 but otherwise afebrile, tachycardic, tachypneic hypoxic.  His abdominal exam shows some mild abdominal pain, however nonfocal, no peritoneal signs, no guarding.  DDx includes viral gastroenteritis, appendicitis, pancreatitis, cholecystitis, SBO, cancer  Labs ordered, reviewed, CMP unremarkable, hemoglobin of 12.9.  Lipase 88 but not consistent with pancreatitis.  UA with small bilirubinemia.    I had a joint decision-making conversation with the patient about whether to proceed forward with CT imaging.  I have low suspicion for acute intra-abdominal pathology however given his history of cancer I do not think would be unreasonable to scan his abdomen and make sure that there are no new findings.  The patient would prefer to get a CT scan today.  CT reviewed, agree with radiology read, negative for acute findings.   Pt will be discharged with zofran and bentyl. Tolerating PO well. Return precautions discussed. He  voiced understanding and is agreeable. Stable for discharge    Final Clinical Impression(s) / ED Diagnoses Final diagnoses:  Generalized abdominal pain    Rx / DC Orders ED Discharge Orders          Ordered    dicyclomine (BENTYL) 20 MG tablet  2 times daily        11/08/21 0521    ondansetron (ZOFRAN) 4 MG tablet  Every 6 hours        11/08/21 0521              Garald Balding, PA-C 11/08/21 3343    Veryl Speak, MD 11/08/21 [phone]

## 2022-02-20 ENCOUNTER — Encounter (HOSPITAL_BASED_OUTPATIENT_CLINIC_OR_DEPARTMENT_OTHER): Payer: Self-pay | Admitting: Emergency Medicine

## 2022-02-20 ENCOUNTER — Other Ambulatory Visit: Payer: Self-pay

## 2022-02-20 ENCOUNTER — Emergency Department (HOSPITAL_BASED_OUTPATIENT_CLINIC_OR_DEPARTMENT_OTHER)
Admission: EM | Admit: 2022-02-20 | Discharge: 2022-02-21 | Disposition: A | Discharge status: Home / Self Care | Payer: 59 | Attending: Emergency Medicine | Admitting: Emergency Medicine

## 2022-02-20 ENCOUNTER — Emergency Department (HOSPITAL_BASED_OUTPATIENT_CLINIC_OR_DEPARTMENT_OTHER): Payer: 59

## 2022-02-20 DIAGNOSIS — I10 Essential (primary) hypertension: Secondary | ICD-10-CM | POA: Insufficient documentation

## 2022-02-20 DIAGNOSIS — F17210 Nicotine dependence, cigarettes, uncomplicated: Secondary | ICD-10-CM | POA: Diagnosis not present

## 2022-02-20 LAB — COMPREHENSIVE METABOLIC PANEL
ALT: 11 U/L (ref 0–44)
AST: 18 U/L (ref 15–41)
Albumin: 3.7 g/dL (ref 3.5–5.0)
Alkaline Phosphatase: 102 U/L (ref 38–126)
Anion gap: 7 (ref 5–15)
BUN: 19 mg/dL (ref 6–20)
CO2: 24 mmol/L (ref 22–32)
Calcium: 8.8 mg/dL — ABNORMAL LOW (ref 8.9–10.3)
Chloride: 107 mmol/L (ref 98–111)
Creatinine, Ser: 1.17 mg/dL (ref 0.61–1.24)
GFR, Estimated: >60 mL/min (ref >60)
Glucose, Bld: 124 mg/dL — ABNORMAL HIGH (ref 70–99)
Potassium: 3.5 mmol/L (ref 3.5–5.1)
Sodium: 138 mmol/L (ref 135–145)
Total Bilirubin: 0.7 mg/dL (ref 0.3–1.2)
Total Protein: 7.1 g/dL (ref 6.5–8.1)

## 2022-02-20 LAB — CBC WITH DIFFERENTIAL/PLATELET
Abs Immature Granulocytes: 0.02 10*3/uL (ref 0.00–0.07)
Basophils Absolute: 0 10*3/uL (ref 0.0–0.1)
Basophils Relative: 1 %
Eosinophils Absolute: 0.1 10*3/uL (ref 0.0–0.5)
Eosinophils Relative: 2 %
HCT: 38.4 % — ABNORMAL LOW (ref 39.0–52.0)
Hemoglobin: 13.2 g/dL (ref 13.0–17.0)
Immature Granulocytes: 0 %
Lymphocytes Relative: 44 %
Lymphs Abs: 2.7 10*3/uL (ref 0.7–4.0)
MCH: 31.4 pg (ref 26.0–34.0)
MCHC: 34.4 g/dL (ref 30.0–36.0)
MCV: 91.4 fL (ref 80.0–100.0)
Monocytes Absolute: 0.5 10*3/uL (ref 0.1–1.0)
Monocytes Relative: 8 %
Neutro Abs: 2.7 10*3/uL (ref 1.7–7.7)
Neutrophils Relative %: 45 %
Platelets: 220 10*3/uL (ref 150–400)
RBC: 4.2 MIL/uL — ABNORMAL LOW (ref 4.22–5.81)
RDW: 13.4 % (ref 11.5–15.5)
WBC: 6 10*3/uL (ref 4.0–10.5)
nRBC: 0 % (ref 0.0–0.2)

## 2022-02-20 LAB — TROPONIN I (HIGH SENSITIVITY): Troponin I (High Sensitivity): 5 ng/L (ref <18)

## 2022-02-20 MED ORDER — KETOROLAC TROMETHAMINE 30 MG/ML IJ SOLN
30.0000 mg | Freq: Once | INTRAMUSCULAR | Status: AC
  Administered 2022-02-21: 30 mg via INTRAVENOUS
  Filled 2022-02-20: qty 1

## 2022-02-20 MED ORDER — LACTATED RINGERS IV BOLUS
1000.0000 mL | Freq: Once | INTRAVENOUS | Status: AC
Start: 2022-02-21 — End: 2022-02-21
  Administered 2022-02-21: 1000 mL via INTRAVENOUS

## 2022-02-20 NOTE — ED Triage Notes (Signed)
Patient states he has been having headaches intermittently for a month. Also reports on Thursday his hands started shaking and he dropped to his knees, states his blood pressure has been running high. Patient c/o constant dizziness. Patient also c/o bright red blood in stool for the past week.

## 2022-02-21 ENCOUNTER — Emergency Department (HOSPITAL_BASED_OUTPATIENT_CLINIC_OR_DEPARTMENT_OTHER): Payer: 59

## 2022-02-21 LAB — URINALYSIS, ROUTINE W REFLEX MICROSCOPIC
Bilirubin Urine: NEGATIVE
Glucose, UA: NEGATIVE mg/dL
Hgb urine dipstick: NEGATIVE
Ketones, ur: NEGATIVE mg/dL
Leukocytes,Ua: NEGATIVE
Nitrite: NEGATIVE
Protein, ur: NEGATIVE mg/dL
Specific Gravity, Urine: 1.02 (ref 1.005–1.030)
pH: 6 (ref 5.0–8.0)

## 2022-02-21 LAB — TROPONIN I (HIGH SENSITIVITY): Troponin I (High Sensitivity): 5 ng/L (ref <18)

## 2022-02-21 LAB — TSH: TSH: 1.857 u[IU]/mL (ref 0.350–4.500)

## 2022-02-21 LAB — T4, FREE: Free T4: 0.94 ng/dL (ref 0.61–1.12)

## 2022-02-21 MED ORDER — HYDROCHLOROTHIAZIDE 25 MG PO TABS: 25.0000 mg | ORAL_TABLET | Freq: Every day | ORAL | 1 refills | Status: DC

## 2022-02-21 MED ORDER — IOHEXOL 300 MG/ML  SOLN
100.0000 mL | Freq: Once | INTRAMUSCULAR | Status: AC | PRN
  Administered 2022-02-21: 100 mL via INTRAVENOUS

## 2022-02-21 NOTE — ED Provider Notes (Signed)
Emergency Department Provider Note  I have reviewed the triage vital signs and the nursing notes.  HISTORY  Chief Complaint Hypertension   HPI [name] is a 52 y.o. male with multiple complaints.  The main reason it seems like is that his blood pressure was elevated has been a bit more dizzy lately.  He states that he also has weight loss, night sweats, randomly crying.  Also having random episodes of sweating when talking to other people.  Increased fatigue.  States when asked about his mental health he states that is not in a great place is he still grasping with his divorce from 6 years ago and obviously his kids much.  States that things seem to got a lot worse with his 51th birthday for some reason.  His coworkers noticed that he was not act like himself and with his physical symptoms they told him he should come to the ER for evaluation.  PMH Past Medical History:  Diagnosis Date   ED (erectile dysfunction)    GERD (gastroesophageal reflux disease)    History of Hodgkin's disease    Hyperlipidemia    Hypertension    Sarcoid     Home Medications Prior to Admission medications   Medication Sig Start Date End Date Taking? Authorizing Provider  dicyclomine (BENTYL) 20 MG tablet Take 1 tablet (20 mg total) by mouth 2 (two) times daily. 11/08/21   Mare Ferrari, PA-C  methocarbamol (ROBAXIN) 500 MG tablet Take 1 tablet (500 mg total) by mouth 2 (two) times daily. 06/10/17   Maxwell Caul, PA-C  ondansetron (ZOFRAN) 4 MG tablet Take 1 tablet (4 mg total) by mouth every 6 (six) hours. 11/08/21   Mare Ferrari, PA-C  traZODone (DESYREL) 100 MG tablet Take 1 tablet (100 mg total) by mouth at bedtime. 02/14/17   Charm Rings, NP    Social History Social History   Tobacco Use   Smoking status: Every Day    Types: Cigarettes   Smokeless tobacco: Never   Tobacco comments:    stop smoking cigarettes in 2015  Vaping Use   Vaping Use: Never used  Substance Use  Topics   Alcohol use: Never    Comment: rare   Drug use: Yes    Types: Marijuana    Comment: occasional    Review of Systems: Documented in HPI ____________________________________________  PHYSICAL EXAM: VITAL SIGNS: ED Triage Vitals  Enc Vitals Group     BP 02/20/22 2132 131/88     Pulse Rate 02/20/22 2132 80     Resp 02/20/22 2132 16     Temp 02/20/22 2132 98.5 F (36.9 C)     Temp Source 02/20/22 2132 Oral     SpO2 02/20/22 2132 99 %     Weight 02/20/22 2126 130 lb (59 kg)     Height 02/20/22 2126 5\' 5"  (1.651 m)     Head Circumference --      Peak Flow --      Pain Score 02/20/22 2126 8     Pain Loc --      Pain Edu? --      Excl. in GC? --    Physical Exam    ____________________________________________   LABS (all labs ordered are listed, but only abnormal results are displayed)  Labs Reviewed  CBC WITH DIFFERENTIAL/PLATELET - Abnormal; Notable for the following components:      Result Value   RBC 4.20 (*)    HCT 38.4 (*)  All other components within normal limits  COMPREHENSIVE METABOLIC PANEL - Abnormal; Notable for the following components:   Glucose, Bld 124 (*)    Calcium 8.8 (*)    All other components within normal limits  TSH  T4, FREE  URINALYSIS, ROUTINE W REFLEX MICROSCOPIC  TROPONIN I (HIGH SENSITIVITY)  TROPONIN I (HIGH SENSITIVITY)   ____________________________________________  EKG   EKG Interpretation  Date/Time:    Ventricular Rate:    PR Interval:    QRS Duration:   QT Interval:    QTC Calculation:   R Axis:     Text Interpretation:          ____________________________________________  RADIOLOGY  DG Chest 2 View  Result Date: 02/20/2022 CLINICAL DATA:  Shortness of breath. EXAM: CHEST - 2 VIEW COMPARISON:  01/24/2017 FINDINGS: The heart is upper normal in size. Stable mediastinal contours. Surgical clip projects to the right of the mediastinum. No focal airspace disease. No pneumothorax, pulmonary edema or  significant pleural effusion. No acute osseous findings. IMPRESSION: 1. Upper normal heart size. Heart size appears to have increased from 2019. Consider correlation with echocardiogram. 2.  No acute pulmonary process. Electronically Signed   By: Narda Rutherford M.D.   On: 02/20/2022 21:57   ____________________________________________  PROCEDURES  Procedure(s) performed:   Procedures ____________________________________________  INITIAL IMPRESSION / ASSESSMENT AND PLAN   This patient presents to the ED for concern of multiple complaints, this involves an extensive number of treatment options, and is a complaint that carries with it a high risk of complications and morbidity.  The differential diagnosis includes recurrent cancer, most metastatic cancer, depression.  Is not currently suicidal.  Additional history obtained:  Additional history obtained from no one Previous records obtained and reviewed in epic showing CT scan for 5 years ago some enlarged lymph nodes that seem to be stable.  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 Agree with Radiology interpretation. Details in ED course.  Labs ordered reviewed by myself as detailed in ED course.  Consultations obtained/considered detailed in ED course.   Clinical Course as of 02/21/22 0636  Wed Feb 21, 2022  0026 Hemoglobin: 13.2 Reassuring  [JM]  0026 Troponin I (High Sensitivity): 5 Reassuring will await second 1 [JM]  0026 Calcium(!): 8.8 Only minimally low [JM]  0026 DG Chest 2 View [JM]  0027 On my interpretation is reassuring however radiology notes that his heart borders seem to be a little enlarged compared to previous.  I already suggested he get an outpatient echocardiogram. [JM]    Clinical Course User Index [JM] Eevee Borbon, Barbara Cower, MD       Cardiac Monitoring:  The patient was maintained on a cardiac monitor.  I personally viewed and interpreted the cardiac monitored which showed an underlying rhythm of: sinus  CRITICAL INTERVENTIONS:  N/a  Reevaluation:  After the interventions noted above, I reevaluated the patient and found that they have :improved  No evidence of endorgan damage related to his blood pressure.  Other workup is also reassuring.  Labs reassuring.  FINAL IMPRESSION AND PLAN Final diagnoses:  Hypertension, unspecified type   A medical screening exam was performed and I feel the patient has had an appropriate workup for their chief complaint at this time and likelihood of emergent condition existing is low. They have been counseled on decision,  DISCHARGE, follow up and which symptoms necessitate immediate return to the emergency department. They or their family verbally stated understanding and agreement with plan and discharged in stable condition.   ____________________________________________   NEW OUTPATIENT MEDICATIONS STARTED DURING THIS VISIT:  New Prescriptions   No medications on file    Note:  This note was prepared with assistance of Dragon voice recognition software. Occasional wrong-word or sound-a-like substitutions may have occurred due to the inherent limitations of voice recognition software.    Sharrie Self, Barbara Cower, MD 02/22/22 [phone]

## 2022-02-21 NOTE — ED Notes (Signed)
Patient transported to CT (late entry)

## 2022-02-21 NOTE — ED Notes (Signed)
Pt agreeable with d/c plan as discussed by provider- this nurse has verbally reinforced d/c instructions and provided pt with written copy.  Pt acknowledges verbal understanding and denies any addl questions concerns needs- pt ambulatory independently at d/c with steady gait; vitals stable- no distress.

## 2022-03-07 ENCOUNTER — Ambulatory Visit (INDEPENDENT_AMBULATORY_CARE_PROVIDER_SITE_OTHER): Payer: 59 | Admitting: Medical

## 2022-03-07 ENCOUNTER — Encounter: Payer: Self-pay | Admitting: Medical

## 2022-03-07 VITALS — BP 125/75 | HR 76 | Temp 98.0°F | Resp 18 | Ht 65.0 in | Wt 129.0 lb

## 2022-03-07 DIAGNOSIS — F17200 Nicotine dependence, unspecified, uncomplicated: Secondary | ICD-10-CM

## 2022-03-07 DIAGNOSIS — R1310 Dysphagia, unspecified: Secondary | ICD-10-CM

## 2022-03-07 DIAGNOSIS — R739 Hyperglycemia, unspecified: Secondary | ICD-10-CM | POA: Diagnosis not present

## 2022-03-07 DIAGNOSIS — K5900 Constipation, unspecified: Secondary | ICD-10-CM

## 2022-03-07 DIAGNOSIS — R634 Abnormal weight loss: Secondary | ICD-10-CM | POA: Diagnosis not present

## 2022-03-07 DIAGNOSIS — Z125 Encounter for screening for malignant neoplasm of prostate: Secondary | ICD-10-CM | POA: Diagnosis not present

## 2022-03-07 DIAGNOSIS — F419 Anxiety disorder, unspecified: Secondary | ICD-10-CM

## 2022-03-07 DIAGNOSIS — Z1211 Encounter for screening for malignant neoplasm of colon: Secondary | ICD-10-CM

## 2022-03-07 DIAGNOSIS — F3289 Other specified depressive episodes: Secondary | ICD-10-CM

## 2022-03-07 DIAGNOSIS — R6883 Chills (without fever): Secondary | ICD-10-CM

## 2022-03-07 DIAGNOSIS — Z8571 Personal history of Hodgkin lymphoma: Secondary | ICD-10-CM

## 2022-03-07 LAB — HEMOGLOBIN A1C: Hgb A1c MFr Bld: 5 % (ref 4.6–6.5)

## 2022-03-07 LAB — TSH: TSH: 0.55 u[IU]/mL (ref 0.35–5.50)

## 2022-03-07 LAB — T4, FREE: Free T4: 1.05 ng/dL (ref 0.60–1.60)

## 2022-03-07 LAB — PSA: PSA: 1.47 ng/mL (ref 0.10–4.00)

## 2022-03-07 MED ORDER — SERTRALINE HCL 25 MG PO TABS: 25.0000 mg | ORAL_TABLET | Freq: Every day | ORAL | 3 refills | Status: DC

## 2022-03-07 NOTE — Progress Notes (Addendum)
Subjective:    Patient ID: [name], male    DOB: [birth year], 52 y.o.   MRN: [id number]  HPI  Pt in for first time.  He works at [place] at [employer] . Does not exercise regularly. Does not eat healthy. Smokes 5 cigarettes a day presently. He started smoking 2019 2 packs a day until he cut back most recently. Very rare occasional alcohol use.   Pt went to ED the other day.   "Chief Complaint Hypertension     HPI [name] is a 52 y.o. male with multiple complaints.  The main reason it seems like is that his blood pressure was elevated has been a bit more dizzy lately.  He states that he also has weight loss, night sweats, randomly crying.  Also having random episodes of sweating when talking to other people.  Increased fatigue.  States when asked about his mental health he states that is not in a great place is he still grasping with his divorce from 6 years ago and obviously his kids much.  States that things seem to got a lot worse with his 51th birthday for some reason.  His coworkers noticed that he was not act like himself and with his physical symptoms they told him he should come to the ER for evaluation."      Clinical Course as of 02/21/22 0636  Wed Feb 21, 2022  0026 Hemoglobin: 13.2 Reassuring  [JM]  0026 Troponin I (High Sensitivity): 5 Reassuring will await second 1 [JM]  0026 Calcium(!): 8.8 Only minimally low [JM]  0026 DG Chest 2 View [JM]  0027 On my interpretation is reassuring however radiology notes that his heart borders seem to be a little enlarged compared to previous.  I already suggested he get an outpatient echocardiogram. [JM]    FINAL IMPRESSION AND PLAN Final diagnoses:  Hypertension, unspecified type     Pt does have hx of Hodgkins lymphoma. Pt seen by Dr. Marin Olp in the past. Seen by Dr. Sheryle Hail back in 2010 but can't see note details.  He also reports hx of sarcoidosis. Out of a month has missed  13 days in past month.   He states he gets full quickly and painful swallowing. Sometimes can't swallow and will vomit.  Diabetes diagnosis in computer. I don't see A1c.  Constipation- small hard stools for one month. At times appears blood other time looks black.    Pt has fmla form that is to be filled out. But not aware of what is underlying issue.   Review of Systems  Constitutional:  Positive for chills, fatigue and unexpected weight change. Negative for fever.       30 lb weight loss over past 2 years. Weight fluctuates up and down.  HENT:  Negative for congestion, drooling and ear pain.   Respiratory:  Negative for cough, chest tightness, shortness of breath and wheezing.   Cardiovascular:  Negative for chest pain and palpitations.  Gastrointestinal:  Negative for abdominal pain.  Genitourinary:  Negative for dysuria and flank pain.  Musculoskeletal:  Negative for back pain and joint swelling.  Skin:  Negative for rash.  Neurological:  Positive for dizziness. Negative for syncope, weakness, light-headedness and headaches.       Random dizziness and blurred vision.   Hematological:  Negative for adenopathy. Does not bruise/bleed easily.  Psychiatric/Behavioral:  Negative for behavioral problems, confusion and dysphoric mood.     Past Medical History:  Diagnosis Date  ED (erectile dysfunction)    GERD (gastroesophageal reflux disease)    History of Hodgkin's disease    Hyperlipidemia    Hypertension    Sarcoid      Social History   Socioeconomic History   Marital status: Single    Spouse name: Not on file   Number of children: Not on file   Years of education: Not on file   Highest education level: Not on file  Occupational History   Not on file  Tobacco Use   Smoking status: Every Day    Types: Cigarettes   Smokeless tobacco: Never   Tobacco comments:    stop smoking cigarettes in 2015  Vaping Use   Vaping Use: Never used  Substance and Sexual Activity    Alcohol use: Never    Comment: rare   Drug use: Yes    Types: Marijuana    Comment: occasional   Sexual activity: Not on file  Other Topics Concern   Not on file  Social History Narrative   Not on file   Social Determinants of Health   Financial Resource Strain: Not on file  Food Insecurity: Not on file  Transportation Needs: Not on file  Physical Activity: Not on file  Stress: Not on file  Social Connections: Not on file  Intimate Partner Violence: Not on file    Past Surgical History:  Procedure Laterality Date   arm surgery Left 2001   HARDWARE REMOVAL Left 06/30/2014   Procedure: HARDWARE REMOVAL, left elbow;  Surgeon: Dorna Leitz, MD;  Location: [facility];  Service: Orthopedics;  Laterality: Left;   HERNIA REPAIR     INCISION AND DRAINAGE ABSCESS Left 06/30/2014   Procedure: INCISION AND DRAINAGE ABSCESS, left elbow;  Surgeon: Dorna Leitz, MD;  Location: [facility];  Service: Orthopedics;  Laterality: Left;   LUNG BIOPSY     TONSILLECTOMY      Family History  Problem Relation Age of Onset   Hypertension Mother    Hypertension Father    Cancer Other    Diabetes Brother     No Known Allergies  Current Outpatient Medications on File Prior to Visit  Medication Sig Dispense Refill   hydrochlorothiazide (HYDRODIURIL) 25 MG tablet Take 1 tablet (25 mg total) by mouth daily. 30 tablet 1   No current facility-administered medications on file prior to visit.    BP 108/86   Pulse 76   Temp 98 F (36.7 C)   Resp 18   Ht 5' 5"$  (1.651 m)   Wt 129 lb (58.5 kg)   SpO2 99%   BMI 21.47 kg/m        Objective:   Physical Exam   General Mental Status- Alert. General Appearance- Not in acute distress.   Skin General: Color- Normal Color. Moisture- Normal Moisture.  Neck Carotid Arteries- Normal color. Moisture- Normal Moisture. No carotid bruits. No JVD.  Chest and Lung Exam Auscultation: Breath Sounds:-Normal.  Cardiovascular Auscultation:Rythm-  Regular. Murmurs & Other Heart Sounds:Auscultation of the heart reveals- No Murmurs.  Abdomen Inspection:-Inspeection Normal. Palpation/Percussion:Note:No mass. Palpation and Percussion of the abdomen reveal-  mild Tender left side abdomen, Non Distended + BS, no rebound or guarding.  Neurologic Cranial Nerve exam:- CN III-XII intact(No nystagmus), symmetric smile. Drift Test:- No drift. Romberg Exam:- Negative.  Heal to Toe Gait exam:-Normal. Finger to Nose:- Normal/Intact Strength:- 5/5 equal and symmetric strength both upper and lower extremities.       Assessment & Plan:   Patient Instructions  1. Elevated blood sugar In computer see diabetes dx but no sugar average? So will get 3 month average test. - Hemoglobin A1c  2. Screening for prostate cancer As part of weight loss work up. - PSA  3. Weight loss 50 years with signifiant wt loss. Please call GI off this week.  - Ambulatory referral to Gastroenterology - Ambulatory referral to Hematology / Oncology - TSH with t4.  4. Smoker Advise smoking cessation.  5. Dysphagia, unspecified type I think you will need egd. - Ambulatory referral to Gastroenterology  6. Constipation, unspecified constipation type Hydrate well and can increase fiber in diet.  - Ambulatory referral to Gastroenterology  7. Screening for colon cancer - Ambulatory referral to Gastroenterology  8. Chills and  History of Hodgkin's disease Recent cbc done. No alarming finding but with hx of hodgkins place referral back to hemaologist/oncolonist - Ambulatory referral to Hematology / Oncology   10. Hypocalcemia - T4, free - PTH, Intact and Calcium  11.  Depression and  Anxiety. Phq 9 score 27 and Gad 7 21. Rx sertraline 25 mg daily. Rx advisement. You have high score for each condition. No active thoughts harm to self. No active plans/nothing persistent. If you have thoughts that persist then be seen in the emergency dept at [facility]  long.   Follow up in 2 weeks or sooner if needed.       Mackie Pai, PA-C   Time spent with patient today was 48 minutes which consisted of chart revdiew, discussing diagnosis, work up treatment and documentation.

## 2022-03-07 NOTE — Patient Instructions (Addendum)
1. Elevated blood sugar In computer see diabetes dx but no sugar average? So will get 3 month average test. - Hemoglobin A1c  2. Screening for prostate cancer As part of weight loss work up. - PSA  3. Weight loss 50 years with signifiant wt loss. Please call GI off this week.  - Ambulatory referral to Gastroenterology - Ambulatory referral to Hematology / Oncology - TSH with t4.  4. Smoker Advise smoking cessation.  5. Dysphagia, unspecified type I think you will need egd. - Ambulatory referral to Gastroenterology  6. Constipation, unspecified constipation type Hydrate well and can increase fiber in diet.  - Ambulatory referral to Gastroenterology  7. Screening for colon cancer - Ambulatory referral to Gastroenterology  8. Chills and  History of Hodgkin's disease Recent cbc done. No alarming finding but with hx of hodgkins place referral back to hemaologist/oncolonist - Ambulatory referral to Hematology / Oncology   10. Hypocalcemia - T4, free - PTH, Intact and Calcium  11.  Depression and  Anxiety. Phq 9 score 27 and Gad 7 21. Rx sertraline 25 mg daily. Rx advisement. You have high score for each condition. No active thoughts harm to self. No active plans/nothing persistent. If you have thoughts that persist then be seen in the emergency dept at [facility].   Follow up in 2 weeks or sooner if needed.

## 2022-03-08 LAB — PTH, INTACT AND CALCIUM
Calcium: 9.8 mg/dL (ref 8.6–10.3)
PTH: 80 pg/mL — ABNORMAL HIGH (ref 16–77)

## 2022-03-12 ENCOUNTER — Encounter: Payer: Self-pay | Admitting: Medical

## 2022-03-13 ENCOUNTER — Inpatient Hospital Stay: Payer: 59 | Attending: Hematology & Oncology

## 2022-03-13 ENCOUNTER — Inpatient Hospital Stay: Payer: 59 | Admitting: Hematology & Oncology

## 2022-03-26 ENCOUNTER — Ambulatory Visit: Payer: 59 | Admitting: Medical

## 2022-04-02 ENCOUNTER — Inpatient Hospital Stay: Payer: 59 | Attending: Hematology & Oncology

## 2022-04-02 ENCOUNTER — Inpatient Hospital Stay (HOSPITAL_BASED_OUTPATIENT_CLINIC_OR_DEPARTMENT_OTHER): Payer: 59 | Admitting: Hematology & Oncology

## 2022-04-02 ENCOUNTER — Other Ambulatory Visit: Payer: Self-pay

## 2022-04-02 ENCOUNTER — Encounter: Payer: Self-pay | Admitting: Hematology & Oncology

## 2022-04-02 ENCOUNTER — Ambulatory Visit: Payer: 59 | Admitting: Medical

## 2022-04-02 VITALS — BP 120/74 | HR 77 | Temp 98.2°F | Resp 18 | Ht 65.0 in | Wt 131.0 lb

## 2022-04-02 DIAGNOSIS — Z8572 Personal history of non-Hodgkin lymphomas: Secondary | ICD-10-CM | POA: Diagnosis not present

## 2022-04-02 DIAGNOSIS — D869 Sarcoidosis, unspecified: Secondary | ICD-10-CM

## 2022-04-02 DIAGNOSIS — R634 Abnormal weight loss: Secondary | ICD-10-CM | POA: Insufficient documentation

## 2022-04-02 DIAGNOSIS — D518 Other vitamin B12 deficiency anemias: Secondary | ICD-10-CM

## 2022-04-02 LAB — CMP (CANCER CENTER ONLY)
ALT: 9 U/L (ref 0–44)
AST: 16 U/L (ref 15–41)
Albumin: 4.5 g/dL (ref 3.5–5.0)
Alkaline Phosphatase: 86 U/L (ref 38–126)
Anion gap: 11 (ref 5–15)
BUN: 21 mg/dL — ABNORMAL HIGH (ref 6–20)
CO2: 27 mmol/L (ref 22–32)
Calcium: 9.6 mg/dL (ref 8.9–10.3)
Chloride: 102 mmol/L (ref 98–111)
Creatinine: 1.4 mg/dL — ABNORMAL HIGH (ref 0.61–1.24)
GFR, Estimated: >60 mL/min (ref >60)
Glucose, Bld: 158 mg/dL — ABNORMAL HIGH (ref 70–99)
Potassium: 3.7 mmol/L (ref 3.5–5.1)
Sodium: 140 mmol/L (ref 135–145)
Total Bilirubin: 0.9 mg/dL (ref 0.3–1.2)
Total Protein: 7.7 g/dL (ref 6.5–8.1)

## 2022-04-02 LAB — CBC WITH DIFFERENTIAL (CANCER CENTER ONLY)
Abs Immature Granulocytes: 0.02 10*3/uL (ref 0.00–0.07)
Basophils Absolute: 0 10*3/uL (ref 0.0–0.1)
Basophils Relative: 1 %
Eosinophils Absolute: 0.1 10*3/uL (ref 0.0–0.5)
Eosinophils Relative: 1 %
HCT: 39.6 % (ref 39.0–52.0)
Hemoglobin: 13.5 g/dL (ref 13.0–17.0)
Immature Granulocytes: 0 %
Lymphocytes Relative: 34 %
Lymphs Abs: 2 10*3/uL (ref 0.7–4.0)
MCH: 31.6 pg (ref 26.0–34.0)
MCHC: 34.1 g/dL (ref 30.0–36.0)
MCV: 92.7 fL (ref 80.0–100.0)
Monocytes Absolute: 0.5 10*3/uL (ref 0.1–1.0)
Monocytes Relative: 9 %
Neutro Abs: 3.3 10*3/uL (ref 1.7–7.7)
Neutrophils Relative %: 55 %
Platelet Count: 233 10*3/uL (ref 150–400)
RBC: 4.27 MIL/uL (ref 4.22–5.81)
RDW: 13.5 % (ref 11.5–15.5)
WBC Count: 5.9 10*3/uL (ref 4.0–10.5)
nRBC: 0 % (ref 0.0–0.2)

## 2022-04-02 LAB — SAVE SMEAR(SSMR), FOR PROVIDER SLIDE REVIEW

## 2022-04-02 LAB — TSH: TSH: 0.338 u[IU]/mL — ABNORMAL LOW (ref 0.350–4.500)

## 2022-04-02 LAB — LACTATE DEHYDROGENASE: LDH: 144 U/L (ref 98–192)

## 2022-04-02 LAB — VITAMIN B12: Vitamin B-12: 448 pg/mL (ref 180–914)

## 2022-04-02 NOTE — Progress Notes (Signed)
Referral MD  Reason for Referral: Weight loss chills and remote history of Hodgkin's lymphoma  Chief Complaint  Patient presents with   New Patient (Initial Visit)  : I just do not feel well.  I have lost weight.  HPI: Mr. [name] is a nice 52 year old white male.  I have not seen him probably for about 20 years.  We initially saw him back in 2009.  At that time, he had lymphadenopathy.  Had a left inguinal lymph node that was biopsied and found to be classical Hodgkin's disease.  We treated him with ABVD.  I think he received 4 cycles or so.  He apparently had a lot of trouble with treatment.  He was hospitalized for complications.  Regardless, he has never had a problem with obvious recurrent Hodgkin's disease.  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  He had surgery for this.  I think this was in 2016.   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  He had bronchoscopy back in October 2009.  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  He also has had problems with staff bacteremia.  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  He was on antibiotics for several weeks.  This was back in 2009.  Currently, he is lost 20 pounds in the past year.  This is smoking just a couple cigarettes a day.  Is hard for him to work because he gets fatigued so easily.  He has had a fairly thorough investigation.  He has had a CT of the abdomen and pelvis.  This was done on 02/20/2022.  Again, there is nothing that looked suspicious.  He has had a CT of the head.  This also was done on 02/20/2022.  This was unremarkable.  He has had lab work done.  Back in January, his white count was 6.  Hemoglobin 13.2.  Platelet count 220,000.  MCV was 91.  His calcium was 8.8 with an albumin of 3.7.  LFTs were normal.  Blood sugar was 124.  He has been tested for influenza and COVID.  These were all negative.  He  apparently did have some thyroid studies done.  A TSH was 0.55 and T4 was 1.05.  This was in February.  A PSA was done in February that was 1.47.  He subsequently was referred to the [facility] since we treated him 20 years ago.  I think what might be interesting is fact that he had COVID about 3 years ago.  He has had no rashes.  He has had some problems with  itching.  He has had sweats.  He has had fever.  He has had no bleeding.  There is no change in bowel or bladder habits.  He has had some nausea but no vomiting.  Currently, I would say his performance status is probably ECOG 1.    Past Medical History:  Diagnosis Date   ED (erectile dysfunction)    GERD (gastroesophageal reflux disease)    History of Hodgkin's disease    Hyperlipidemia    Hypertension    Sarcoid   :   Past Surgical History:  Procedure Laterality Date   arm surgery Left 2001   HARDWARE REMOVAL Left 06/30/2014   Procedure: HARDWARE REMOVAL, left elbow;  Surgeon: Jorge Leitz, MD;  Location: [place];  Service: Orthopedics;  Laterality: Left;  HERNIA REPAIR     INCISION AND DRAINAGE ABSCESS Left 06/30/2014   Procedure: INCISION AND DRAINAGE ABSCESS, left elbow;  Surgeon: Jorge Leitz, MD;  Location: [place];  Service: Orthopedics;  Laterality: Left;   LUNG BIOPSY     TONSILLECTOMY    :   Current Outpatient Medications:    hydrochlorothiazide (HYDRODIURIL) 25 MG tablet, Take 1 tablet (25 mg total) by mouth daily., Disp: 30 tablet, Rfl: 1   sertraline (ZOLOFT) 25 MG tablet, Take 1 tablet (25 mg total) by mouth daily., Disp: 30 tablet, Rfl: 3:  :  No Known Allergies:   Family History  Problem Relation Age of Onset   Hypertension Mother    Hypertension Father    Cancer Other    Diabetes Brother   :   Social History   Socioeconomic History   Marital status: Single    Spouse name: Not on file   Number of children: Not on file   Years of education: Not on file   Highest education  level: Not on file  Occupational History   Not on file  Tobacco Use   Smoking status: Every Day    Packs/day: 0.50    Types: Cigarettes   Smokeless tobacco: Never   Tobacco comments:    Smokes 6 cigarettes/day. 04/02/22  Vaping Use   Vaping Use: Never used  Substance and Sexual Activity   Alcohol use: Never    Comment: rare   Drug use: Yes    Types: Marijuana    Comment: occasional   Sexual activity: Not on file  Other Topics Concern   Not on file  Social History Narrative   Not on file   Social Determinants of Health   Financial Resource Strain: Not on file  Food Insecurity: Food Insecurity Present (04/02/2022)   Hunger Vital Sign    Worried About Running Out of Food in the Last Year: Sometimes true    Ran Out of Food in the Last Year: Sometimes true  Transportation Needs: No Transportation Needs (04/02/2022)   PRAPARE - Hydrologist (Medical): No    Lack of Transportation (Non-Medical): No  Physical Activity: Not on file  Stress: Not on file  Social Connections: Not on file  Intimate Partner Violence: Not At Risk (04/02/2022)   Humiliation, Afraid, Rape, and Kick questionnaire    Fear of Current or Ex-Partner: No    Emotionally Abused: No    Physically Abused: No    Sexually Abused: No  :  Review of Systems  Constitutional:  Positive for fever, malaise/fatigue and weight loss.  HENT: Negative.    Eyes: Negative.   Respiratory:  Positive for shortness of breath.   Cardiovascular: Negative.   Gastrointestinal: Negative.   Genitourinary: Negative.   Musculoskeletal:  Positive for myalgias.  Skin:  Positive for itching.  Neurological:  Positive for dizziness and weakness.  Endo/Heme/Allergies: Negative.   Psychiatric/Behavioral: Negative.       Exam: Vital signs are temperature 98.2.  Pulse 77.  Blood pressure 120/74.  Weight is 131 pounds.  '@IPVITALS'$ @ Physical Exam Vitals reviewed.  HENT:     Head: Normocephalic and  atraumatic.  Eyes:     Pupils: Pupils are equal, round, and reactive to light.  Cardiovascular:     Rate and Rhythm: Normal rate and regular rhythm.     Heart sounds: Normal heart sounds.  Pulmonary:     Effort: Pulmonary effort is normal.     Breath sounds: Normal  breath sounds.  Abdominal:     General: Bowel sounds are normal.     Palpations: Abdomen is soft.  Musculoskeletal:        General: No tenderness or deformity. Normal range of motion.     Cervical back: Normal range of motion.  Lymphadenopathy:     Cervical: No cervical adenopathy.  Skin:    General: Skin is warm and dry.     Findings: No erythema or rash.  Neurological:     Mental Status: He is alert and oriented to person, place, and time.  Psychiatric:        Behavior: Behavior normal.        Thought Content: Thought content normal.        Judgment: Judgment normal.       Recent Labs    04/02/22 1054  WBC 5.9  HGB 13.5  HCT 39.6  PLT 233    Recent Labs    04/02/22 1054  NA 140  K 3.7  CL 102  CO2 27  GLUCOSE 158*  BUN 21*  CREATININE 1.40*  CALCIUM 9.6    Blood smear review: Blood smear shows a normochromic and normocytic population of red blood cells.  There are no nucleated red blood cells.  I see no teardrop cells.  There are no schistocytes or spherocytes.  I see no rouleaux formation.  White blood cells appear normal in morphology maturation.  There may been a slight increase in hypersegmented polys.  I do not see any blasts.  Platelets are adequate in number and size.  Pathology: None    Assessment and Plan: Mr.  Bagent is a very nice 52 year old African-American male.  He had Hodgkin's disease 20 years ago.  He was treated for this.  He has had multiple other health issues since then.  I really do not have an obvious explanation as to what is going on with him.  I find it interesting that he had bronchoscopy back about 15 years ago which showed sarcoidosis.  As such, I do not know  if this might be an issue for him.  I also find interesting that he had [place] 3 years ago.  I know that there is this long COVID syndrome that he may have.  Again there is no way to probably prove this.  I am going to send off a vitamin B12 level on him.  We will repeat his thyroid test.  We may have to think about a bone marrow test on him.  I realize his blood counts are okay.  However, given the fact that he was treated with chemotherapy 20 years ago, he is currently is at risk for myelodysplasia or for an actual hematologic malignancy.  I feel bad for him.  Again, there is nothing obvious that I can find right now.  We will get a hold of him will get the thyroid studies back and the B12 study back.  We will do a bone marrow test on him.  Again, I will know sarcoidosis might be an issue.  I know he has not had a CT of the chest so this might be some to think about.  We will try to help take care of this over the phone is much as possible.  I know it might be tough for him to come in.  It was nice seeing he and his wife.  I did enjoy talking to them.

## 2022-04-04 LAB — T3 UPTAKE: T3 Uptake Ratio: 25 % (ref 24–39)

## 2022-04-04 LAB — T4: T4, Total: 8.6 ug/dL (ref 4.5–12.0)

## 2022-04-06 ENCOUNTER — Ambulatory Visit (HOSPITAL_BASED_OUTPATIENT_CLINIC_OR_DEPARTMENT_OTHER)
Admission: RE | Admit: 2022-04-06 | Discharge: 2022-04-06 | Disposition: A | Discharge status: Home / Self Care | Payer: 59 | Source: Ambulatory Visit | Attending: Medical | Admitting: Medical

## 2022-04-06 ENCOUNTER — Ambulatory Visit: Payer: 59 | Admitting: Medical

## 2022-04-06 VITALS — BP 118/84 | HR 86 | Resp 18 | Ht 65.0 in | Wt 131.0 lb

## 2022-04-06 DIAGNOSIS — R7989 Other specified abnormal findings of blood chemistry: Secondary | ICD-10-CM

## 2022-04-06 DIAGNOSIS — R059 Cough, unspecified: Secondary | ICD-10-CM | POA: Insufficient documentation

## 2022-04-06 DIAGNOSIS — R634 Abnormal weight loss: Secondary | ICD-10-CM | POA: Insufficient documentation

## 2022-04-06 DIAGNOSIS — F17200 Nicotine dependence, unspecified, uncomplicated: Secondary | ICD-10-CM | POA: Insufficient documentation

## 2022-04-06 DIAGNOSIS — Z8571 Personal history of Hodgkin lymphoma: Secondary | ICD-10-CM

## 2022-04-06 DIAGNOSIS — D869 Sarcoidosis, unspecified: Secondary | ICD-10-CM | POA: Diagnosis present

## 2022-04-06 DIAGNOSIS — G4452 New daily persistent headache (NDPH): Secondary | ICD-10-CM

## 2022-04-06 DIAGNOSIS — R42 Dizziness and giddiness: Secondary | ICD-10-CM

## 2022-04-06 DIAGNOSIS — R0683 Snoring: Secondary | ICD-10-CM

## 2022-04-06 LAB — CBC WITH DIFFERENTIAL/PLATELET
Absolute Monocytes: 629 cells/uL (ref 200–950)
Basophils Absolute: 37 cells/uL (ref 0–200)
Basophils Relative: 0.5 %
Eosinophils Absolute: 37 cells/uL (ref 15–500)
Eosinophils Relative: 0.5 %
HCT: 37.6 % — ABNORMAL LOW (ref 38.5–50.0)
Hemoglobin: 12.8 g/dL — ABNORMAL LOW (ref 13.2–17.1)
Lymphs Abs: 1939 cells/uL (ref 850–3900)
MCH: 31.5 pg (ref 27.0–33.0)
MCHC: 34 g/dL (ref 32.0–36.0)
MCV: 92.6 fL (ref 80.0–100.0)
MPV: 8.9 fL (ref 7.5–12.5)
Monocytes Relative: 8.5 %
Neutro Abs: 4758 cells/uL (ref 1500–7800)
Neutrophils Relative %: 64.3 %
Platelets: 249 10*3/uL (ref 140–400)
RBC: 4.06 10*6/uL — ABNORMAL LOW (ref 4.20–5.80)
RDW: 12.7 % (ref 11.0–15.0)
Total Lymphocyte: 26.2 %
WBC: 7.4 10*3/uL (ref 3.8–10.8)

## 2022-04-06 MED ORDER — CYCLOBENZAPRINE HCL 5 MG PO TABS: ORAL_TABLET | ORAL | 0 refills | Status: DC

## 2022-04-06 NOTE — Patient Instructions (Addendum)
Low TSH level Normal t4. Subclinal hyperthyroid value now. But some wt loss. Low ca as well. Refer to endocrinologist.  Smoker(smoked for past 5 years pack a day. Sarcoidosis and sarcoidosis(cough productive for a month in am) Will get chest xray today. After review of that xray may get ct chest and refer to pulmonlogist.    Weight loss. 30 lb wt loss over 2 years. Psa normal. Call GI office as did already refer and they tried to call you.  History of Hodgkin's disease Follow thru with Dr Marin Olp work may bone marrow study.  New daily persistent headache Dizzy CT head without contrast was negative. Will refer to neurologist. Can continue to use tylenol for HA. On discussion sometimes trapezius pain with ha. Wil give low dose flexeril to use 5 mg at night if tension type ha.    Follow up in 2 weeks or sooner if needed.

## 2022-04-06 NOTE — Progress Notes (Signed)
Subjective:    Patient ID: [name], male    DOB: 1970/05/03, 52 y.o.   MRN: [id number]  HPI  Pt in for follow up.   He states he still feels dizzy and fatigued. Last visit he had similar complained. He reports headache and head pressure. Some nausea at times.  He states feeling fatigued. He tries tylenol but does not help much. No nsaids. Currently with GI symptoms. No hx of ulcer. When younger no ha. Current ha are new.   On review he had CT of his head end of January.     FINDINGS: Brain: No evidence of acute infarction, hemorrhage, hydrocephalus, extra-axial collection or mass lesion/mass effect.   Vascular: No hyperdense vessel or unexpected calcification.   Skull: Normal. Negative for fracture or focal lesion.   Sinuses/Orbits: No acute finding.   Other: None.   IMPRESSION: No acute abnormality noted.   Pt has hx of smoking since 2018. He is coughing intermittent. States cough is productive over past year. Pt states he does snore. Pt does have history of sarcoidosis. Bronchosopy in past. Pt can't remember who was pulmonologist.   30 lb wt loss in 2 years. Pt prostate protein was normal. I placed referral to GI MD. They tried to call pt but could not leave message.  Hx of hodgkins. Dr. Marin Olp may do bone marrow study.  On lab review. Pt tsh was low but t4 was normal.    Review of Systems  Constitutional:  Positive for chills, fatigue and unexpected weight change. Negative for fever.       30 lb weight loss over past 2 years. Weight fluctuates up and down.  HENT:  Negative for congestion, drooling and ear pain.   Respiratory:  Negative for cough, chest tightness, shortness of breath and wheezing.   Cardiovascular:  Negative for chest pain and palpitations.  Gastrointestinal:  Negative for abdominal pain.  Genitourinary:  Negative for dysuria and flank pain.  Musculoskeletal:  Negative for back pain and joint swelling.  Skin:  Negative for rash.   Neurological:  Positive for dizziness and headaches. Negative for syncope, weakness and light-headedness.       Random dizziness and blurred vision.   Hematological:  Negative for adenopathy. Does not bruise/bleed easily.  Psychiatric/Behavioral:  Negative for behavioral problems, confusion and dysphoric mood.     Past Medical History:  Diagnosis Date   ED (erectile dysfunction)    GERD (gastroesophageal reflux disease)    History of Hodgkin's disease    Hyperlipidemia    Hypertension    Sarcoid      Social History   Socioeconomic History   Marital status: Single    Spouse name: Not on file   Number of children: Not on file   Years of education: Not on file   Highest education level: Not on file  Occupational History   Not on file  Tobacco Use   Smoking status: Every Day    Packs/day: .5    Types: Cigarettes   Smokeless tobacco: Never   Tobacco comments:    Smokes 6 cigarettes/day. 04/02/22  Vaping Use   Vaping Use: Never used  Substance and Sexual Activity   Alcohol use: Never    Comment: rare   Drug use: Yes    Types: Marijuana    Comment: occasional   Sexual activity: Not on file  Other Topics Concern   Not on file  Social History Narrative   Not on file   Social  Determinants of Health   Financial Resource Strain: Not on file  Food Insecurity: Food Insecurity Present (04/02/2022)   Hunger Vital Sign    Worried About Running Out of Food in the Last Year: Sometimes true    Ran Out of Food in the Last Year: Sometimes true  Transportation Needs: No Transportation Needs (04/02/2022)   PRAPARE - Hydrologist (Medical): No    Lack of Transportation (Non-Medical): No  Physical Activity: Not on file  Stress: Not on file  Social Connections: Not on file  Intimate Partner Violence: Not At Risk (04/02/2022)   Humiliation, Afraid, Rape, and Kick questionnaire    Fear of Current or Ex-Partner: No    Emotionally Abused: No    Physically  Abused: No    Sexually Abused: No    Past Surgical History:  Procedure Laterality Date   arm surgery Left 2001   HARDWARE REMOVAL Left 06/30/2014   Procedure: HARDWARE REMOVAL, left elbow;  Surgeon: Dorna Leitz, MD;  Location: [place];  Service: Orthopedics;  Laterality: Left;   HERNIA REPAIR     INCISION AND DRAINAGE ABSCESS Left 06/30/2014   Procedure: INCISION AND DRAINAGE ABSCESS, left elbow;  Surgeon: Dorna Leitz, MD;  Location: [place];  Service: Orthopedics;  Laterality: Left;   LUNG BIOPSY     TONSILLECTOMY      Family History  Problem Relation Age of Onset   Hypertension Mother    Hypertension Father    Cancer Other    Diabetes Brother     No Known Allergies  Current Outpatient Medications on File Prior to Visit  Medication Sig Dispense Refill   hydrochlorothiazide (HYDRODIURIL) 25 MG tablet Take 1 tablet (25 mg total) by mouth daily. 30 tablet 1   sertraline (ZOLOFT) 25 MG tablet Take 1 tablet (25 mg total) by mouth daily. 30 tablet 3   No current facility-administered medications on file prior to visit.    BP 118/84   Pulse 86   Resp 18   Ht 5\' 5"  (1.651 m)   Wt 131 lb (59.4 kg)   SpO2 98%   BMI 21.80 kg/m        Objective:   Physical Exam  General Mental Status- Alert. General Appearance- Not in acute distress.   Skin General: Color- Normal Color. Moisture- Normal Moisture.  Neck Carotid Arteries- Normal color. Moisture- Normal Moisture. No carotid bruits. No JVD.  Chest and Lung Exam Auscultation: Breath Sounds:-Normal.  Cardiovascular Auscultation:Rythm- Regular. Murmurs & Other Heart Sounds:Auscultation of the heart reveals- No Murmurs.  Abdomen Inspection:-Inspeection Normal. Palpation/Percussion:Note:No mass. Palpation and Percussion of the abdomen reveal- Non Tender, Non Distended + BS, no rebound or guarding.    Neurologic Cranial Nerve exam:- CN III-XII intact(No nystagmus), symmetric smile. Drift Test:- No drift. Romberg Exam:-  Negative.  Heal to Toe Gait exam:-Normal. Finger to Nose:- Normal/Intact Strength:- 5/5 equal and symmetric strength both upper and lower extremities.       Assessment & Plan:   Patient Instructions   Low TSH level Normal t4. Subclinal hyperthyroid value now. But some wt loss. Low ca as ell.  Smoker(smoked for past 5 years pack a day. Sarcoidosis and sarcoidosis(cough productive for a month in am) Will get chest xray today. After review of that xray may get ct chest and refer to pulmonlogist.    Weight loss. 30 lb wt loss over 2 years. Psa normal. Call GI office as did already refer and they tried to call you.  History of Hodgkin's disease Follow thru with Dr Marin Olp work may bone marrow study.  New daily persistent headache Dizzy CT head without contrast was negative. Will refer to neurologist.     Follow up in 2 weeks or sooner if needed.   Mackie Pai, PA-C   Time spent with patient today was 40  minutes which consisted of chart review, discussing diagnoses, work up, placing referrals, exlaining, treatment and documentation.

## 2022-04-08 NOTE — Addendum Note (Signed)
Addended by: Anabel Halon on: 04/08/2022 08:17 AM   Modules accepted: Orders

## 2022-04-18 ENCOUNTER — Encounter: Payer: Self-pay | Admitting: "Endocrinology

## 2022-04-18 ENCOUNTER — Ambulatory Visit: Payer: 59 | Admitting: Medical

## 2022-04-25 ENCOUNTER — Telehealth: Payer: Self-pay | Admitting: Neurology

## 2022-04-25 ENCOUNTER — Ambulatory Visit: Payer: 59 | Admitting: Neurology

## 2022-04-25 NOTE — Telephone Encounter (Signed)
Ok sorry to hear that. Looks like this is his first cancellation/no-show w/ [facility].

## 2022-04-25 NOTE — Telephone Encounter (Signed)
Pt stated he isn't feeling well today and can't make his appointment.

## 2022-04-26 ENCOUNTER — Encounter (HOSPITAL_BASED_OUTPATIENT_CLINIC_OR_DEPARTMENT_OTHER): Payer: Self-pay | Admitting: Pulmonary Disease

## 2022-04-26 ENCOUNTER — Ambulatory Visit (HOSPITAL_BASED_OUTPATIENT_CLINIC_OR_DEPARTMENT_OTHER): Payer: 59 | Admitting: Pulmonary Disease

## 2022-04-26 VITALS — BP 130/72 | HR 70 | Ht 65.0 in | Wt 134.8 lb

## 2022-04-26 DIAGNOSIS — Z8571 Personal history of Hodgkin lymphoma: Secondary | ICD-10-CM | POA: Diagnosis not present

## 2022-04-26 DIAGNOSIS — R634 Abnormal weight loss: Secondary | ICD-10-CM

## 2022-04-26 DIAGNOSIS — D869 Sarcoidosis, unspecified: Secondary | ICD-10-CM

## 2022-04-26 NOTE — Progress Notes (Signed)
Subjective:   PATIENT ID: [name] GENDER: male DOB: 1970/09/15, MRN: [id number]  Chief Complaint  Patient presents with   Consult    Coughing dark brown/reddish phlegm  Started smoking in 2019 0.5 pkd daily    Reason for Visit: New consult for sarcoidosis  Mr. [name] is a 52 year old male active smoker with remote hx of Hodgkin's lymphoma in 2001, sarcoidosis, HTN, HLD, GERD, depression/anxiety who presents for weight loss.  He has a history of Hodgkins. He had lymphadenopathy and left inguinal lymph node was biopsied and found to be classical Hodgkins. He was treated with ABVD. No recurrence. He recently was evaluated by Oncology on 04/02/22 with Dr. Marin Olp for 20lb weight loss. He had presented to ED 02/21/22 with multiple complaints including weight loss, night sweats and fatigue. CT A/P 02/20/22 with no evidence of malignancy. Labs this year unremarkable including thyroid studies with subclinical hypothyroidism. Oncology will plan for BMBx. PCP referring to GI for EGD for dysphagia.  He was previously followed by Dr. Melvyn Novas at [facility] in 2009. He also had bronchoscopy in 10/2007 with biopsies suggestive of sarcoidosis.   He reports his primary complaints is his weight loss in the last 5 years (20lbs), night sweats x 2 years, loss of appetite and fatigue. He has irregular sleep hours. He has had a month history of productive cough. He has noticed gagging when swallowing. Sometimes has intermittent chest pain that can occur at rest and can vary in time from seconds to up half hour.   Social History: Smokes 1/2 ppd. Smokes MJ  I have personally reviewed patient's past medical/family/social history, allergies, current medications.  Past Medical History:  Diagnosis Date   ED (erectile dysfunction)    GERD (gastroesophageal reflux disease)    History of Hodgkin's disease    Hyperlipidemia    Hypertension    Sarcoid      Family History  Problem Relation  Age of Onset   Hypertension Mother    Hypertension Father    Cancer Other    Diabetes Brother      Social History   Occupational History   Not on file  Tobacco Use   Smoking status: Every Day    Packs/day: .5    Types: Cigarettes   Smokeless tobacco: Never   Tobacco comments:    Smokes 6 cigarettes/day. 04/02/22  Vaping Use   Vaping Use: Never used  Substance and Sexual Activity   Alcohol use: Never    Comment: rare   Drug use: Yes    Types: Marijuana    Comment: occasional   Sexual activity: Not on file    No Known Allergies   Outpatient Medications Prior to Visit  Medication Sig Dispense Refill   cyclobenzaprine (FLEXERIL) 5 MG tablet 1 tab po q hs prn trapezius pain with ha. 3 tablet 0   hydrochlorothiazide (HYDRODIURIL) 25 MG tablet Take 1 tablet (25 mg total) by mouth daily. 30 tablet 1   sertraline (ZOLOFT) 25 MG tablet Take 1 tablet (25 mg total) by mouth daily. 30 tablet 3   No facility-administered medications prior to visit.    Review of Systems  Constitutional:  Positive for diaphoresis, malaise/fatigue and weight loss. Negative for chills and fever.  HENT:  Negative for congestion.   Respiratory:  Positive for cough, sputum production and shortness of breath. Negative for hemoptysis and wheezing.   Cardiovascular:  Positive for chest pain. Negative for palpitations and leg swelling.  Objective:   Vitals:   04/26/22 0833  BP: 130/72  Pulse: 70  SpO2: 98%  Weight: 134 lb 12.8 oz (61.1 kg)  Height: 5\' 5"  (1.651 m)   SpO2: 98 % O2 Device: None (Room air)  Physical Exam: General: Well, thin-appearing, no acute distress HENT: [place], AT Eyes: EOMI, no scleral icterus Respiratory: Clear to auscultation bilaterally.  No crackles, wheezing or rales Cardiovascular: RRR, -M/R/G, no JVD Extremities:-Edema,-tenderness Neuro: AAO x4, CNII-XII grossly intact Psych: Normal mood, normal affect  Data Reviewed:  Imaging: CXR 04/06/22 -  Hyperinflated  PFT: None on file  Labs:    Latest Ref Rng & Units 04/06/2022    2:48 PM 04/02/2022   10:54 AM 02/20/2022    9:37 PM  CBC  WBC 3.8 - 10.8 Thousand/uL 7.4  5.9  6.0   Hemoglobin 13.2 - 17.1 g/dL 12.8  13.5  13.2   Hematocrit 38.5 - 50.0 % 37.6  39.6  38.4   Platelets 140 - 400 Thousand/uL 249  233  220   04/06/22 - normal diff     Latest Ref Rng & Units 04/02/2022   10:54 AM 03/07/2022    9:52 AM 02/20/2022    9:37 PM  BMP  Glucose 70 - 99 mg/dL 158   124   BUN 6 - 20 mg/dL 21   19   Creatinine 0.61 - 1.24 mg/dL 1.40   1.17   Sodium 135 - 145 mmol/L 140   138   Potassium 3.5 - 5.1 mmol/L 3.7   3.5   Chloride 98 - 111 mmol/L 102   107   CO2 22 - 32 mmol/L 27   24   Calcium 8.9 - 10.3 mg/dL 9.6  9.8  8.8   Normal calcium     Latest Ref Rng & Units 04/02/2022   10:54 AM 02/20/2022    9:37 PM 11/08/2021    1:58 AM  Hepatic Function  Total Protein 6.5 - 8.1 g/dL 7.7  7.1  7.0   Albumin 3.5 - 5.0 g/dL 4.5  3.7  4.0   AST 15 - 41 U/L 16  18  16    ALT 0 - 44 U/L 9  11  9    Alk Phosphatase 38 - 126 U/L 86  102  87   Total Bilirubin 0.3 - 1.2 mg/dL 0.9  0.7  0.7    TSH 0.338 - mildly low. Total T4 8.6 wnl Free T4 1.05 wnl PTH 80 mildly high (UL 77)  Pathology 10/09/07  1. LYMPH NODE, LEFT, SCALENE, BIOPSY:   - NON-NECROTIZING GRANULOMATOUS INFLAMMATION.    2. LYMPH NODE, LEFT, SCALENE, BIOPSY:   - SMALL NODULAR FOCUS WITH AN ATYPICAL LYMPHOID INFILTRATE.   (SEE COMMENT).   - NON-NECROTIZING GRANULOMATOUS INFLAMMATION.   Bronchoscopy 11/14/07 #7 LYMPH NODE, FINE NEEDLE ASPIRATION, THIN PREP AND CELL   BLOCK: SCANT LYMPHOCYTES   AND A FEW HISTIOCYTES, SEE COMMENT   4R # 2 LYMPH NODE, FINE NEEDLE ASPIRATION, THIN PREP AND CELL   BLOCK: LYMPHOCYTES AND HISTIOCYTES. NO MALIGNANT CELLS   IDENTIFIED.    4L LYMPH NODE, FINE NEEDLE ASPIRATION, THIN PREP AND CELL BLOCK:   SCANT LYMPHOCYTES. NO MALIGNANT CELLS IDENTIFIED.   01/08/08  1. LUNG, LEFT LOWER LOBE,  WEDGE RESECTION:   - GRANULOMATOUS INFLAMMATION WITH FOCAL NECROSIS.   - SEE COMMENT.    2. LUNG, LEFT LOWER LOBE, WEDGE RESECTION:   - GRANULOMATOUS INFLAMMATION.   - SEE COMMENT.     Assessment &  Plan:   Discussion: 52 year old male active smoker with remote hx of Hodgkin's lymphoma in 2001, sarcoidosis, HTN, HLD, GERD, depression/anxiety who presents for weight loss. Discussed need to rule out malignancy as primary work-up. Addressed questions and concerns by patient and girlfriend. Counseled on tobacco and MJ cessation (slow titration to prevent cyclic syndrome).  We discussed the clinical course of sarcoid and management including serial PFTs, labs, eye exam, and EKG and chest imaging if indicated. If symptoms suggest sarcoid flare in the future, we would manage with steroids +/- biologics.   Sarcoidosis Weight loss, fatigue --ORDER CT Chest with contrast. May need PET in the future pending on these results  Tobacco Abuse Tobacco abuse Patient is an active smoker. We discussed smoking cessation for 5 minutes. We discussed triggers and stressors and ways to deal with them. We discussed barriers to continued smoking and benefits of smoking cessation. Provided patient with information cessation techniques and interventions including [place] quitline. --ORDER pulmonary function test   Health Maintenance  There is no immunization history on file for this patient. CT Lung Screen - not qualified due to age or pack-years  Orders Placed This Encounter  Procedures   CT Chest W Contrast    Standing Status:   Future    Standing Expiration Date:   04/26/2023    Scheduling Instructions:     When next available    Order Specific Question:   If indicated for the ordered procedure, I authorize the administration of contrast media per Radiology protocol    Answer:   Yes    Order Specific Question:   Does the patient have a contrast media/X-ray dye allergy?    Answer:   No    Order Specific  Question:   Preferred imaging location?    Answer:   [facility]   Pulmonary function test    Standing Status:   Future    Standing Expiration Date:   04/26/2023    Order Specific Question:   Where should this test be performed?    Answer:   [facility]  No orders of the defined types were placed in this encounter.   Return for after PFT, with Dr. Loanne Drilling when next available. Ok to schedule separate days.  I have spent a total time of 60-minutes on the day of the appointment reviewing prior documentation, coordinating care and discussing medical diagnosis and plan with the patient/family. Imaging, labs and tests included in this note have been reviewed and interpreted independently by me.  [place], MD [facility] 04/26/2022 10:41 AM  Office Number [phone]

## 2022-04-26 NOTE — Patient Instructions (Addendum)
Sarcoidosis Weight loss, fatigue --ORDER CT Chest with contrast. May need PET in the future pending on these results  Tobacco Abuse Patient is an active smoker. --ORDER pulmonary function test

## 2022-04-27 ENCOUNTER — Ambulatory Visit: Payer: 59 | Admitting: Medical

## 2022-05-04 ENCOUNTER — Ambulatory Visit: Payer: 59 | Admitting: Medical

## 2022-05-04 ENCOUNTER — Encounter: Payer: Self-pay | Admitting: Medical

## 2022-05-04 VITALS — BP 120/70 | HR 66 | Resp 18 | Ht 65.0 in | Wt 132.0 lb

## 2022-05-04 DIAGNOSIS — R634 Abnormal weight loss: Secondary | ICD-10-CM

## 2022-05-04 DIAGNOSIS — D869 Sarcoidosis, unspecified: Secondary | ICD-10-CM | POA: Diagnosis not present

## 2022-05-04 DIAGNOSIS — F17200 Nicotine dependence, unspecified, uncomplicated: Secondary | ICD-10-CM

## 2022-05-04 DIAGNOSIS — R7989 Other specified abnormal findings of blood chemistry: Secondary | ICD-10-CM

## 2022-05-04 DIAGNOSIS — R739 Hyperglycemia, unspecified: Secondary | ICD-10-CM

## 2022-05-04 NOTE — Patient Instructions (Addendum)
1. Low TSH level Repeat tsh and t4 due to low tsh in past and wt loss. - TSH - T4, free New endocrine referral if labs still abnormal.  2. Elevated blood sugar Not diabetic by last A1c.  3. Weight loss Work up being done. Various referral.  4. Sarcoidosis and Smoker. Seeing pulmonologist. Ct chest pendng.   History of Hodgkin's disease Follow thru with Dr Myna Hidalgo work may bone marrow study. Please call his office as unclear when follow up appt is.  For ha and dizziness want you to call Dr. Lucia Gaskins office. See why they did not schedule. Update me on response. Number [phone]. Also asking referral staff why they never contacted you.  Placed referral to GI. Please call there office directly. [phone]. Remind them colonoscopy referral placed. Have them review. With wt loss important to get.  Follow up in one month or sooner if needed.

## 2022-05-04 NOTE — Progress Notes (Signed)
Subjective:    Patient ID: [name], male    DOB: 10/19/1970, 52 y.o.   MRN: [id number]  HPI Pt in for follow up.  Last visit with me AVS.  Low TSH level Normal t4. Subclinal hyperthyroid value now. But some wt loss. Low ca as well. Refer to endocrinologist.   Smoker(smoked for past 5 years pack a day. Sarcoidosis and sarcoidosis(cough productive for a month in am) Will get chest xray today. After review of that xray may get ct chest and refer to pulmonlogist.      Weight loss. 30 lb wt loss over 2 years. Psa normal. Call GI office as did already refer and they tried to call you.   History of Hodgkin's disease Follow thru with Dr Myna Hidalgo work may bone marrow study.   New daily persistent headache Dizzy CT head without contrast was negative. Will refer to neurologist. Can continue to use tylenol for HA. On discussion sometimes trapezius pain with ha. Wil give low dose flexeril to use 5 mg at night if tension type ha.    Only 3 lb wt gain since feb despite tremendous effort.   Pt has seen pulmonologist. Pt is scheduled for ct chest.  "Discussion: 52 year old male active smoker with remote hx of Hodgkin's lymphoma in 2001, sarcoidosis, HTN, HLD, GERD, depression/anxiety who presents for weight loss. Discussed need to rule out malignancy as primary work-up. Addressed questions and concerns by patient and girlfriend. Counseled on tobacco and MJ cessation (slow titration to prevent cyclic syndrome).   We discussed the clinical course of sarcoid and management including serial PFTs, labs, eye exam, and EKG and chest imaging if indicated. If symptoms suggest sarcoid flare in the future, we would manage with steroids +/- biologics.     Sarcoidosis Weight loss, fatigue --ORDER CT Chest with contrast. May need PET in the future pending on these results   Tobacco Abuse Tobacco abuse Patient is an active smoker. We discussed smoking cessation for 5 minutes. We discussed  triggers and stressors and ways to deal with them. We discussed barriers to continued smoking and benefits of smoking cessation. Provided patient with information cessation techniques and interventions including [place] quitline. --ORDER pulmonary function test"   I had also sent in referral to hem/oncologist.   I had sent to endocrinologist and he did not get scheduled.  Not from endo below in "  "Low tsh. Normal t4. 30 lb weight loss in 2 years approximate. Evaluate and treat."   Referred to Dr Lucia Gaskins for headaches and dizziness. Pt states did not get called. Still having ha with dizziness.  CT head showed. MPRESSION: No acute abnormality noted.   On review pt did see Dr. Myna Hidalgo.   "Assessment and Plan: [name] is a very nice 52 year old African-American male.  He had Hodgkin's disease 20 years ago.  He was treated for this.   He has had multiple other health issues since then.   I really do not have an obvious explanation as to what is going on with him.  I find it interesting that he had bronchoscopy back about 15 years ago which showed sarcoidosis.  As such, I do not know if this might be an issue for him.   I also find interesting that he had COVID 3 years ago.  I know that there is this long COVID syndrome that he may have.  Again there is no way to probably prove this.   I am going to  send off a vitamin B12 level on him.  We will repeat his thyroid test.   We may have to think about a bone marrow test on him.  I realize his blood counts are okay.  However, given the fact that he was treated with chemotherapy 20 years ago, he is currently is at risk for myelodysplasia or for an actual hematologic malignancy.   I feel bad for him.  Again, there is nothing obvious that I can find right now.   We will get a hold of him will get the thyroid studies back and the B12 study back.   We will do a bone marrow test on him.   Again, I will know sarcoidosis might be an issue.  I know  he has not had a CT of the chest so this might be some to think about.   We will try to help take care of this over the phone is much as possible.  I know it might be tough for him to come in.  Gi referral placed. Gi called but no answere.   It was nice seeing he and his wife.  I did enjoy talking to them."    Review of Systems  Constitutional:  Positive for unexpected weight change. Negative for chills, fatigue and fever.       30 lb weight loss over past 2 years. Weight fluctuates up and down.  HENT:  Negative for congestion, drooling and ear pain.   Respiratory:  Negative for cough, chest tightness, shortness of breath and wheezing.   Cardiovascular:  Negative for chest pain and palpitations.  Gastrointestinal:  Negative for abdominal pain.  Genitourinary:  Negative for dysuria and flank pain.  Musculoskeletal:  Negative for back pain and joint swelling.  Skin:  Negative for rash.  Neurological:  Positive for dizziness and headaches. Negative for syncope, weakness and light-headedness.       Random dizziness and blurred vision.  Same as before. Pt has work modifications.  Hematological:  Negative for adenopathy. Does not bruise/bleed easily.  Psychiatric/Behavioral:  Negative for behavioral problems, confusion and dysphoric mood.    Past Medical History:  Diagnosis Date   ED (erectile dysfunction)    GERD (gastroesophageal reflux disease)    History of Hodgkin's disease    Hyperlipidemia    Hypertension    Sarcoid      Social History   Socioeconomic History   Marital status: Single    Spouse name: Not on file   Number of children: Not on file   Years of education: Not on file   Highest education level: Not on file  Occupational History   Not on file  Tobacco Use   Smoking status: Every Day    Packs/day: .5    Types: Cigarettes   Smokeless tobacco: Never   Tobacco comments:    Smokes 6 cigarettes/day. 04/02/22  Vaping Use   Vaping Use: Never used  Substance and  Sexual Activity   Alcohol use: Never    Comment: rare   Drug use: Yes    Types: Marijuana    Comment: occasional   Sexual activity: Not on file  Other Topics Concern   Not on file  Social History Narrative   Not on file   Social Determinants of Health   Financial Resource Strain: Not on file  Food Insecurity: Food Insecurity Present (04/02/2022)   Hunger Vital Sign    Worried About Running Out of Food in the Last Year: Sometimes true  Ran Out of Food in the Last Year: Sometimes true  Transportation Needs: No Transportation Needs (04/02/2022)   PRAPARE - Administrator, Civil Service (Medical): No    Lack of Transportation (Non-Medical): No  Physical Activity: Not on file  Stress: Not on file  Social Connections: Not on file  Intimate Partner Violence: Not At Risk (04/02/2022)   Humiliation, Afraid, Rape, and Kick questionnaire    Fear of Current or Ex-Partner: No    Emotionally Abused: No    Physically Abused: No    Sexually Abused: No    Past Surgical History:  Procedure Laterality Date   arm surgery Left 2001   HARDWARE REMOVAL Left 06/30/2014   Procedure: HARDWARE REMOVAL, left elbow;  Surgeon: Jodi Geralds, MD;  Location: [facility] OR;  Service: Orthopedics;  Laterality: Left;   HERNIA REPAIR     INCISION AND DRAINAGE ABSCESS Left 06/30/2014   Procedure: INCISION AND DRAINAGE ABSCESS, left elbow;  Surgeon: Jodi Geralds, MD;  Location: [facility] OR;  Service: Orthopedics;  Laterality: Left;   LUNG BIOPSY     TONSILLECTOMY      Family History  Problem Relation Age of Onset   Hypertension Mother    Hypertension Father    Cancer Other    Diabetes Brother     No Known Allergies  Current Outpatient Medications on File Prior to Visit  Medication Sig Dispense Refill   cyclobenzaprine (FLEXERIL) 5 MG tablet 1 tab po q hs prn trapezius pain with ha. 3 tablet 0   hydrochlorothiazide (HYDRODIURIL) 25 MG tablet Take 1 tablet (25 mg total) by mouth daily. 30 tablet 1    sertraline (ZOLOFT) 25 MG tablet Take 1 tablet (25 mg total) by mouth daily. 30 tablet 3   No current facility-administered medications on file prior to visit.    BP (!) 140/90   Pulse 66   Resp 18   Ht  (1.651 m)   Wt 132 lb (59.9 kg)   SpO2 99%   BMI 21.97 kg/m   120/70 on recheck.     Objective:   Physical Exam  General Mental Status- Alert. General Appearance- Not in acute distress.   Skin General: Color- Normal Color. Moisture- Normal Moisture.  Neck Carotid Arteries- Normal color. Moisture- Normal Moisture. No carotid bruits. No JVD.  Chest and Lung Exam Auscultation: Breath Sounds:-Normal.  Cardiovascular Auscultation:Rythm- Regular. Murmurs & Other Heart Sounds:Auscultation of the heart reveals- No Murmurs.  Abdomen Inspection:-Inspeection Normal. Palpation/Percussion:Note:No mass. Palpation and Percussion of the abdomen reveal- Non Tender, Non Distended + BS, no rebound or guarding.    Neurologic Cranial Nerve exam:- CN III-XII intact(No nystagmus), symmetric smile. Strength:- 5/5 equal and symmetric strength both upper and lower extremities.       Assessment & Plan:   Patient Instructions  1. Low TSH level Repeat tsh and t4 due to low tsh in past and wt loss. - TSH - T4, free New endocrine referral if labs still abnormal.  2. Elevated blood sugar Not diabetic by last A1c.  3. Weight loss Work up being done. Various referral.  4. Sarcoidosis and Smoker. Seeing pulmonologist. Ct chest pendng.   History of Hodgkin's disease Follow thru with Dr Myna Hidalgo work may bone marrow study. Please call his office as unclear when follow up appt is.  For ha and dizziness want you to call Dr. Lucia Gaskins office. See why they did not schedule. Update me on response. Number [phone]. Also asking referral staff why they never contacted  you.  Placed referral to GI. Please call there office directly. [phone]. Remind them colonoscopy referral placed.  Have them review. With wt loss important to get.  Follow up in one month or sooner if needed.        Esperanza Richters, PA-C

## 2022-05-05 LAB — TSH: TSH: 0.35 mIU/L — ABNORMAL LOW (ref 0.40–4.50)

## 2022-05-05 LAB — T4, FREE: Free T4: 1 ng/dL (ref 0.8–1.8)

## 2022-05-05 NOTE — Addendum Note (Signed)
Addended by: Gwenevere Abbot on: 05/05/2022 08:21 AM   Modules accepted: Orders

## 2022-05-12 ENCOUNTER — Ambulatory Visit (HOSPITAL_BASED_OUTPATIENT_CLINIC_OR_DEPARTMENT_OTHER)
Admission: RE | Admit: 2022-05-12 | Discharge: 2022-05-12 | Disposition: A | Discharge status: Home / Self Care | Payer: 59 | Source: Ambulatory Visit | Attending: Pulmonary Disease | Admitting: Pulmonary Disease

## 2022-05-12 ENCOUNTER — Ambulatory Visit (HOSPITAL_BASED_OUTPATIENT_CLINIC_OR_DEPARTMENT_OTHER): Payer: 59

## 2022-05-12 DIAGNOSIS — R634 Abnormal weight loss: Secondary | ICD-10-CM | POA: Diagnosis present

## 2022-05-12 DIAGNOSIS — Z8571 Personal history of Hodgkin lymphoma: Secondary | ICD-10-CM | POA: Diagnosis present

## 2022-05-12 DIAGNOSIS — D869 Sarcoidosis, unspecified: Secondary | ICD-10-CM | POA: Diagnosis present

## 2022-05-12 MED ORDER — IOHEXOL 300 MG/ML  SOLN
100.0000 mL | Freq: Once | INTRAMUSCULAR | Status: AC | PRN
  Administered 2022-05-12: 70 mL via INTRAVENOUS

## 2022-05-14 ENCOUNTER — Other Ambulatory Visit (HOSPITAL_BASED_OUTPATIENT_CLINIC_OR_DEPARTMENT_OTHER): Payer: Self-pay | Admitting: Pulmonary Disease

## 2022-05-14 DIAGNOSIS — Z8571 Personal history of Hodgkin lymphoma: Secondary | ICD-10-CM

## 2022-05-14 DIAGNOSIS — D869 Sarcoidosis, unspecified: Secondary | ICD-10-CM

## 2022-05-14 DIAGNOSIS — R634 Abnormal weight loss: Secondary | ICD-10-CM

## 2022-05-18 ENCOUNTER — Encounter (HOSPITAL_BASED_OUTPATIENT_CLINIC_OR_DEPARTMENT_OTHER): Payer: 59

## 2022-05-18 ENCOUNTER — Ambulatory Visit (HOSPITAL_BASED_OUTPATIENT_CLINIC_OR_DEPARTMENT_OTHER): Payer: 59 | Admitting: Pulmonary Disease

## 2022-05-25 ENCOUNTER — Encounter (HOSPITAL_BASED_OUTPATIENT_CLINIC_OR_DEPARTMENT_OTHER): Payer: 59

## 2022-06-08 ENCOUNTER — Ambulatory Visit (HOSPITAL_BASED_OUTPATIENT_CLINIC_OR_DEPARTMENT_OTHER): Payer: 59 | Admitting: Pulmonary Disease

## 2022-06-08 NOTE — Progress Notes (Deleted)
Subjective:   PATIENT ID: [name] GENDER: male DOB: 1970-04-08, MRN: [id number]  No chief complaint on file.   Reason for Visit: Follow-up  Mr. [name] is a 52 year old male active smoker with remote hx of Hodgkin's lymphoma in 2001, sarcoidosis, HTN, HLD, GERD, depression/anxiety who presents for follow-up weight loss.  He has a history of Hodgkins. He had lymphadenopathy and left inguinal lymph node was biopsied and found to be classical Hodgkins. He was treated with ABVD. No recurrence. He recently was evaluated by Oncology on 04/02/22 with Dr. Myna Hidalgo for 20lb weight loss. He had presented to ED 02/21/22 with multiple complaints including weight loss, night sweats and fatigue. CT A/P 02/20/22 with no evidence of malignancy. Labs this year unremarkable including thyroid studies with subclinical hypothyroidism. Oncology will plan for BMBx. PCP referring to GI for EGD for dysphagia.  He was previously followed by Dr. Sherene Sires at [facility] in 2009. He also had bronchoscopy in 10/2007 with biopsies suggestive of sarcoidosis.   He reports his primary complaints is his weight loss in the last 5 years (20lbs), night sweats x 2 years, loss of appetite and fatigue. He has irregular sleep hours. He has had a month history of productive cough. He has noticed gagging when swallowing. Sometimes has intermittent chest pain that can occur at rest and can vary in time from seconds to up half hour.   Social History: Smokes 1/2 ppd. Smokes MJ  Past Medical History:  Diagnosis Date   ED (erectile dysfunction)    GERD (gastroesophageal reflux disease)    History of Hodgkin's disease    Hyperlipidemia    Hypertension    Sarcoid      Family History  Problem Relation Age of Onset   Hypertension Mother    Hypertension Father    Cancer Other    Diabetes Brother      Social History   Occupational History   Not on file  Tobacco Use   Smoking status: Every Day    Packs/day:  .5    Types: Cigarettes   Smokeless tobacco: Never   Tobacco comments:    Smokes 6 cigarettes/day. 04/02/22  Vaping Use   Vaping Use: Never used  Substance and Sexual Activity   Alcohol use: Never    Comment: rare   Drug use: Yes    Types: Marijuana    Comment: occasional   Sexual activity: Not on file    No Known Allergies   Outpatient Medications Prior to Visit  Medication Sig Dispense Refill   cyclobenzaprine (FLEXERIL) 5 MG tablet 1 tab po q hs prn trapezius pain with ha. 3 tablet 0   hydrochlorothiazide (HYDRODIURIL) 25 MG tablet Take 1 tablet (25 mg total) by mouth daily. 30 tablet 1   sertraline (ZOLOFT) 25 MG tablet Take 1 tablet (25 mg total) by mouth daily. 30 tablet 3   No facility-administered medications prior to visit.    Review of Systems  Constitutional:  Positive for diaphoresis, malaise/fatigue and weight loss. Negative for chills and fever.  HENT:  Negative for congestion.   Respiratory:  Positive for cough, sputum production and shortness of breath. Negative for hemoptysis and wheezing.   Cardiovascular:  Positive for chest pain. Negative for palpitations and leg swelling.     Objective:   There were no vitals filed for this visit.     Physical Exam: General: Well, thin-appearing, no acute distress HENT: [place], AT Eyes: EOMI, no scleral icterus Respiratory: Clear  to auscultation bilaterally.  No crackles, wheezing or rales Cardiovascular: RRR, -M/R/G, no JVD Extremities:-Edema,-tenderness Neuro: AAO x4, CNII-XII grossly intact Psych: Normal mood, normal affect  Data Reviewed:  Imaging: CXR 04/06/22 - Hyperinflated  PFT: None on file  Labs:    Latest Ref Rng & Units 04/06/2022    2:48 PM 04/02/2022   10:54 AM 02/20/2022    9:37 PM  CBC  WBC 3.8 - 10.8 Thousand/uL 7.4  5.9  6.0   Hemoglobin 13.2 - 17.1 g/dL 16.1  09.6  04.5   Hematocrit 38.5 - 50.0 % 37.6  39.6  38.4   Platelets 140 - 400 Thousand/uL 249  233  220   04/06/22 - normal  diff     Latest Ref Rng & Units 04/02/2022   10:54 AM 03/07/2022    9:52 AM 02/20/2022    9:37 PM  BMP  Glucose 70 - 99 mg/dL 409   811   BUN 6 - 20 mg/dL 21   19   Creatinine 9.14 - 1.24 mg/dL 7.82   9.56   Sodium 213 - 145 mmol/L 140   138   Potassium 3.5 - 5.1 mmol/L 3.7   3.5   Chloride 98 - 111 mmol/L 102   107   CO2 22 - 32 mmol/L 27   24   Calcium 8.9 - 10.3 mg/dL 9.6  9.8  8.8   Normal calcium     Latest Ref Rng & Units 04/02/2022   10:54 AM 02/20/2022    9:37 PM 11/08/2021    1:58 AM  Hepatic Function  Total Protein 6.5 - 8.1 g/dL 7.7  7.1  7.0   Albumin 3.5 - 5.0 g/dL 4.5  3.7  4.0   AST 15 - 41 U/L 16  18  16    ALT 0 - 44 U/L 9  11  9    Alk Phosphatase 38 - 126 U/L 86  102  87   Total Bilirubin 0.3 - 1.2 mg/dL 0.9  0.7  0.7    TSH 0.865 - mildly low. Total T4 8.6 wnl Free T4 1.05 wnl PTH 80 mildly high (UL 77)  Pathology 10/09/07  1. LYMPH NODE, LEFT, SCALENE, BIOPSY:   - NON-NECROTIZING GRANULOMATOUS INFLAMMATION.    2. LYMPH NODE, LEFT, SCALENE, BIOPSY:   - SMALL NODULAR FOCUS WITH AN ATYPICAL LYMPHOID INFILTRATE.   (SEE COMMENT).   - NON-NECROTIZING GRANULOMATOUS INFLAMMATION.   Bronchoscopy 11/14/07 #7 LYMPH NODE, FINE NEEDLE ASPIRATION, THIN PREP AND CELL   BLOCK: SCANT LYMPHOCYTES   AND A FEW HISTIOCYTES, SEE COMMENT   4R # 2 LYMPH NODE, FINE NEEDLE ASPIRATION, THIN PREP AND CELL   BLOCK: LYMPHOCYTES AND HISTIOCYTES. NO MALIGNANT CELLS   IDENTIFIED.    4L LYMPH NODE, FINE NEEDLE ASPIRATION, THIN PREP AND CELL BLOCK:   SCANT LYMPHOCYTES. NO MALIGNANT CELLS IDENTIFIED.   01/08/08  1. LUNG, LEFT LOWER LOBE, WEDGE RESECTION:   - GRANULOMATOUS INFLAMMATION WITH FOCAL NECROSIS.   - SEE COMMENT.    2. LUNG, LEFT LOWER LOBE, WEDGE RESECTION:   - GRANULOMATOUS INFLAMMATION.   - SEE COMMENT.     Assessment & Plan:   Discussion: 52 year old male active smoker with remote hx of Hodgkin's lymphoma in 2001, sarcoidosis, HTN, HLD, GERD,  depression/anxiety who presents for weight loss. Discussed need to rule out malignancy as primary work-up. Addressed questions and concerns by patient and girlfriend. Counseled on tobacco and MJ cessation (slow titration to prevent cyclic syndrome).  We discussed  the clinical course of sarcoid and management including serial PFTs, labs, eye exam, and EKG and chest imaging if indicated. If symptoms suggest sarcoid flare in the future, we would manage with steroids +/- biologics.   Sarcoidosis Weight loss, fatigue --Reviewed CT. No PET indicated  Tobacco Abuse Tobacco abuse Patient is an active smoker. We discussed smoking cessation for 5 minutes. We discussed triggers and stressors and ways to deal with them. We discussed barriers to continued smoking and benefits of smoking cessation. Provided patient with information cessation techniques and interventions including [place] quitline. --PFTs not completed   Health Maintenance  There is no immunization history on file for this patient. CT Lung Screen - not qualified due to age or pack-years  No orders of the defined types were placed in this encounter. No orders of the defined types were placed in this encounter.   No follow-ups on file.  I have spent a total time of***-minutes on the day of the appointment including chart review, data review, collecting history, coordinating care and discussing medical diagnosis and plan with the patient/family. Past medical history, allergies, medications were reviewed. Pertinent imaging, labs and tests included in this note have been reviewed and interpreted independently by me.  Lemma Tetro Mechele Collin, MD [facility] 06/08/2022 8:10 AM  Office Number [phone]

## 2022-06-20 ENCOUNTER — Ambulatory Visit: Payer: 59 | Admitting: "Endocrinology

## 2022-07-10 ENCOUNTER — Encounter: Payer: Self-pay | Admitting: Neurology

## 2022-07-10 ENCOUNTER — Telehealth: Payer: Self-pay | Admitting: Neurology

## 2022-07-10 ENCOUNTER — Ambulatory Visit (INDEPENDENT_AMBULATORY_CARE_PROVIDER_SITE_OTHER): Payer: Self-pay | Admitting: Neurology

## 2022-07-10 DIAGNOSIS — Z91199 Patient's noncompliance with other medical treatment and regimen due to unspecified reason: Secondary | ICD-10-CM

## 2022-07-10 NOTE — Progress Notes (Signed)
2 new appointment no shows, please dismiss

## 2022-07-10 NOTE — Telephone Encounter (Signed)
Patient no showed 2 NP appts within 2 months. Would you like to put in for dismissal for this patient Dr. Lucia Gaskins?

## 2022-09-14 ENCOUNTER — Ambulatory Visit: Payer: 59 | Admitting: "Endocrinology

## 2022-10-26 ENCOUNTER — Other Ambulatory Visit: Payer: Self-pay | Admitting: Medical

## 2022-10-26 DIAGNOSIS — Z1211 Encounter for screening for malignant neoplasm of colon: Secondary | ICD-10-CM

## 2022-10-26 DIAGNOSIS — Z1212 Encounter for screening for malignant neoplasm of rectum: Secondary | ICD-10-CM

## 2023-01-03 ENCOUNTER — Ambulatory Visit: Payer: 59 | Admitting: "Endocrinology

## 2023-04-08 ENCOUNTER — Ambulatory Visit: Payer: 59 | Admitting: "Endocrinology

## 2023-05-29 ENCOUNTER — Telehealth: Payer: Self-pay

## 2023-05-29 NOTE — Transitions of Care (Post Inpatient/ED Visit) (Signed)
   05/29/2023  Name: [name] MRN: [id number] DOB: 08/27/1970  Today's TOC FU Call Status: Today's TOC FU Call Status:: Successful TOC FU Call Completed TOC FU Call Complete Date: 05/29/23 Patient's Name and Date of Birth confirmed.  Transition Care Management Follow-up Telephone Call Date of Discharge: 05/28/23 Discharge Facility: [facility] Type of Discharge: Inpatient Admission Primary Inpatient Discharge Diagnosis:: convulsions How have you been since you were released from the hospital?: Better Any questions or concerns?: No  Items Reviewed: Did you receive and understand the discharge instructions provided?: Yes Medications obtained,verified, and reconciled?: Yes (Medications Reviewed) Any new allergies since your discharge?: No Dietary orders reviewed?: Yes Do you have support at home?: Yes People in Home [RPT]: significant other  Medications Reviewed Today: Medications Reviewed Today     Reviewed by Darrall Ellison, LPN (Licensed Practical Nurse) on 05/29/23 at 1532  Med List Status: <None>   Medication Order Taking? Sig Documenting Provider Last Dose Status Informant  atorvastatin  (LIPITOR) 40 MG tablet [id number] Yes Take 1 tablet by mouth at bedtime. [provider] Taking Active   cyclobenzaprine  (FLEXERIL ) 5 MG tablet [id number] No 1 tab po q hs prn trapezius pain with ha.  Patient not taking: Reported on 05/29/2023   Saguier, Edward, PA-C Not Taking Active   divalproex (DEPAKOTE) 500 MG DR tablet [id number] Yes Take 500 mg by mouth 2 (two) times daily. [provider] Taking Active   hydrochlorothiazide  (HYDRODIURIL ) 25 MG tablet [id number] No Take 1 tablet (25 mg total) by mouth daily.  Patient not taking: Reported on 05/29/2023   Mesner, Reymundo Caulk, MD Not Taking Active   sertraline  (ZOLOFT ) 25 MG tablet [id number] No Take 1 tablet (25 mg total) by mouth daily.  Patient not taking: Reported on 05/29/2023   Saguier, Edward, PA-C Not Taking  Active             Home Care and Equipment/Supplies: Were Home Health Services Ordered?: NA Any new equipment or medical supplies ordered?: NA  Functional Questionnaire: Do you need assistance with bathing/showering or dressing?: No Do you need assistance with meal preparation?: No Do you need assistance with eating?: No Do you have difficulty maintaining continence: No Do you need assistance with getting out of bed/getting out of a chair/moving?: No Do you have difficulty managing or taking your medications?: No  Follow up appointments reviewed: PCP Follow-up appointment confirmed?: Yes Date of PCP follow-up appointment?: 05/31/23 Follow-up Provider: [facility] Follow-up appointment confirmed?: Yes Date of Specialist follow-up appointment?: 06/03/23 Follow-Up Specialty Provider:: neuro Do you need transportation to your follow-up appointment?: No Do you understand care options if your condition(s) worsen?: Yes-patient verbalized understanding    SIGNATURE Darrall Ellison, LPN [facility] Nurse Health Advisor Direct Dial [phone]

## 2023-05-31 ENCOUNTER — Inpatient Hospital Stay: Admitting: Medical

## 2023-06-13 ENCOUNTER — Ambulatory Visit (INDEPENDENT_AMBULATORY_CARE_PROVIDER_SITE_OTHER): Admitting: Medical

## 2023-06-13 VITALS — BP 150/88 | HR 66 | Resp 18 | Ht 65.0 in | Wt 118.4 lb

## 2023-06-13 DIAGNOSIS — F3289 Other specified depressive episodes: Secondary | ICD-10-CM | POA: Diagnosis not present

## 2023-06-13 DIAGNOSIS — F419 Anxiety disorder, unspecified: Secondary | ICD-10-CM | POA: Diagnosis not present

## 2023-06-13 MED ORDER — FLUOXETINE HCL 10 MG PO CAPS: 10.0000 mg | ORAL_CAPSULE | Freq: Every day | ORAL | 3 refills | Status: AC

## 2023-06-13 MED ORDER — ASPIRIN 81 MG PO CHEW: 81.0000 mg | CHEWABLE_TABLET | Freq: Every day | ORAL | 3 refills | Status: AC

## 2023-06-13 MED ORDER — FLUOXETINE HCL 10 MG PO CAPS: 10.0000 mg | ORAL_CAPSULE | Freq: Every day | ORAL | 0 refills | Status: DC

## 2023-06-13 NOTE — Progress Notes (Signed)
 Subjective:    Patient ID: [name], male    DOB: Sep 14, 1970, 53 y.o.   MRN: [id number]  HPI  Pt in for follow up on hospitalizations. He states 3 recent hospitallizations for seizures.  Below is AVS from 1st visit with me   Patient Instructions  1. Low TSH level Repeat tsh and t4 due to low tsh in past and wt loss. - TSH - T4, free New endocrine referral if labs still abnormal.   2. Elevated blood sugar Not diabetic by last A1c.   3. Weight loss Work up being done. Various referral.   4. Sarcoidosis and Smoker. Seeing pulmonologist. Ct chest pendng.     History of Hodgkin's disease Follow thru with Dr Maria Shiner work may bone marrow study. Please call his office as unclear when follow up appt is.   For ha and dizziness want you to call Dr. Tresia Fruit office. See why they did not schedule. Update me on response. Number [phone]. Also asking referral staff why they never contacted you.   Placed referral to GI. Please call there office directly. [phone]. Remind them colonoscopy referral placed. Have them review. With wt loss important to get.   Follow up in one month or sooner if needed.    Most recent hospitalization was in early May.    General Medicine Discharge Summary   Name: [name] Age: 83 yrs  MRN: [id number] DOB: 1970-06-30  Admit Date: 05/26/2023 Admitting Physician: Floria Hurst, MD  Discharge Date: 05/28/2023 Discharge Physician: No att. providers found   Most recent hospitalzation   Discharge Diagnoses:   Principal Problem: Seizure (CMD) Active Problems: Anxiety and depression Pulmonary sarcoidosis (CMD) Resolved Problems: * No resolved hospital problems. *  TO DO List at Follow-up for PCP/Specialist:   Key Medication changes: as below Pending labs to follow up on: as below Incidental findings requiring follow-up: as below    Hospital Course:   For full details, please see H&P, progress notes, consult notes and  ancillary notes. Briefly  53 y.o. male with a history significant for remote history of Hodgkin's lymphoma and pulmonary sarcoid who presents with seizure-like activity. History provided by patient and review of EMR. Patient reportedly was found to have seizure-like activity out of sleep morning of the admission. This was witnessed by his significant other. His significant other reported that he was shaking on the LEFT side of his body. This lasted for a few minutes. It occurred around 7 AM. He has been diagnosed with nocturnal seizures. He's hada spot EEG back in Jan 2025 that unrevealing. He's scheduled for a 72 hr ambulatory EEG but this has not been completed. He reports compliance with his Keppra 1000 mg twice daily. He is able to tolerate it without any significant side effects. He thinks his last seizure was back in January 2025 when he was admitted to the hospital.   In the ER he received Keppra by IV x 1. Neurology consulted.They recommended the following on discharge of this patient with close outpatient follow up  - Decrease keppra to 500mg  bid for one week, then stop keppra - discontinue LTM - Continue Vimpat 100 mg twice daily - initiate depakote 500mg  every evening to help with mood, appetite, seizure control  The following seizure precautions were also given  Seizure Precautions: - please refrain from climbing higher than 4 feet - please refrain from taking a tub bath alone and/or leave the door unlocked while in the shower - please refrain from  swimming unsupervised - please wear a helmet when riding a bike or rollerblades - please refrain from driving and contact the DMV. Patient was instructed not to drive for at least 6 months from LOC event. - older persons are advised to take reasonable precautions and restrictions with more dangerous activities, such as operating heavy machinery and playing contact sports  The patient's chronic medical conditions were treated accordingly  per the patient's home medication regimen except as noted in the plan above and in the medication list below.   Discharge Condition:   Disposition: Patient discharged to Home or Self Care in good condition.  Diet at discharge:  Activity at Discharge: Ambulate ad lib   Discharge Medications:    Medication List    START taking these medications   divalproex 500 mg 12 hr tablet Commonly known as: DEPAKOTE DR Take 1 tablet (500 mg total) by mouth every evening.  lacosamide 100 mg tablet Commonly known as: VIMPAT Take 1 tablet (100 mg total) by mouth 2 (two) times a day.     CHANGE how you take these medications   levETIRAcetam 500 mg tablet Commonly known as: KEPPRA Take 1 tablet (500 mg total) by mouth 2 (two) times a day for 7 days. What changed: how much to take     CONTINUE taking these medications   aspirin 81 mg chewable tablet Chew 1 tablet (81 mg total) daily.  atorvastatin  40 mg tablet Commonly known as: LIPITOR Take 1 tablet (40 mg total) by mouth at bedtime.  FLUoxetine  10 mg tablet Commonly known as: PROzac  TAKE 1 TABLET BY MOUTH ONCE DAILY FOR 14 DAYS THEN 2 TABLETS ONCE DAILY     Today hpi 06/13/23  [name] is a 53 year old male with a history of seizures who presents for evaluation of recent seizures and possible stroke.  He experienced a seizure on December 16, 2022, during which he woke up and collapsed, with convulsions observed by his girlfriend. Another seizure occurred in the first or second week of May 2025. He is currently taking Depakote DR 500 mg once at night and Vimpat 100 mg twice daily for seizure management. He was previously on Keppra for seven days post-discharge.  In February 2025, he was diagnosed with an ischemic stroke, although MRI and MRA studies of the head and neck were normal. 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 He experiences slurred speech, right-sided  facial droop, and balance issues, leaning to the right when walking. He was previously prescribed aspirin 81 mg daily, which he stopped taking.  He has been on modified duty at work, limited to five hours a day, five days a week, but is currently unable to work due to recent seizures. He is uncertain about his FMLA eligibility and has a note excusing him from work.  Pt has been depressed and anxious. High phq-9 and gad-7 scores. Pt has transiet brief thoughts better off dead but none currently. He denies any plans and no attempt of suicide. No thoughts harm to others. He down play thoughts of harm to self. States just depressed over problems with employement related to seizures. No seizures since last hospital dc.   Review of Systems  Constitutional:  Negative for chills, fatigue and fever.  HENT:  Negative for congestion.   Respiratory:  Negative for cough, shortness of breath and wheezing.   Cardiovascular:  Negative for chest pain and palpitations.  Gastrointestinal:  Negative for abdominal pain.  Musculoskeletal:  Negative for back pain, myalgias and neck pain.  Skin:  Negative for rash.  Neurological:  Negative for dizziness, syncope, speech difficulty, weakness and light-headedness.       No seizure since most recent hospital dc.  Hematological:  Negative for adenopathy. Does not bruise/bleed easily.  Psychiatric/Behavioral:  Positive for dysphoric mood. Negative for behavioral problems, sleep disturbance and suicidal ideas. The patient is nervous/anxious.        No current thoughts harm to self.    Past Medical History:  Diagnosis Date   ED (erectile dysfunction)    GERD (gastroesophageal reflux disease)    History of Hodgkin's disease    Hyperlipidemia    Hypertension    Sarcoid      Social History   Socioeconomic History   Marital status: Single    Spouse name: Not on file   Number of children: Not on file   Years of education: Not on file   Highest education level:  Not on file  Occupational History   Not on file  Tobacco Use   Smoking status: Every Day    Current packs/day: 0.50    Types: Cigarettes   Smokeless tobacco: Never   Tobacco comments:    Smokes 6 cigarettes/day. 04/02/22  Vaping Use   Vaping status: Never Used  Substance and Sexual Activity   Alcohol use: Never    Comment: rare   Drug use: Yes    Types: Marijuana    Comment: occasional   Sexual activity: Not on file  Other Topics Concern   Not on file  Social History Narrative   Not on file   Social Drivers of Health   Financial Resource Strain: Not on file  Food Insecurity: Low Risk  (02/08/2023)   Received from [facility]   Hunger Vital Sign    Worried About Running Out of Food in the Last Year: Never true    Ran Out of Food in the Last Year: Never true  Transportation Needs: No Transportation Needs (02/08/2023)   Received from [facility]    In the past 12 months, has lack of reliable transportation kept you from medical appointments, meetings, work or from getting things needed for daily living? : No  Physical Activity: Not on file  Stress: Not on file  Social Connections: Not on file  Intimate Partner Violence: Not At Risk (04/02/2022)   Humiliation, Afraid, Rape, and Kick questionnaire    Fear of Current or Ex-Partner: No    Emotionally Abused: No    Physically Abused: No    Sexually Abused: No    Past Surgical History:  Procedure Laterality Date   arm surgery Left 2001   HARDWARE REMOVAL Left 06/30/2014   Procedure: HARDWARE REMOVAL, left elbow;  Surgeon: Neil Balls, MD;  Location: [facility] OR;  Service: Orthopedics;  Laterality: Left;   HERNIA REPAIR     INCISION AND DRAINAGE ABSCESS Left 06/30/2014   Procedure: INCISION AND DRAINAGE ABSCESS, left elbow;  Surgeon: Neil Balls, MD;  Location: [facility] OR;  Service: Orthopedics;  Laterality: Left;   LUNG BIOPSY     TONSILLECTOMY      Family History  Problem Relation Age of Onset   Hypertension  Mother    Hypertension Father    Cancer Other    Diabetes Brother     No Known Allergies  Current Outpatient Medications on File Prior to Visit  Medication Sig Dispense Refill   Lacosamide 100 MG TABS Take 100  mg by mouth.     atorvastatin  (LIPITOR) 40 MG tablet Take 1 tablet by mouth at bedtime.     divalproex (DEPAKOTE) 500 MG DR tablet Take 500 mg by mouth 2 (two) times daily.     No current facility-administered medications on file prior to visit.    BP (!) 150/88   Pulse 66   Resp 18   Ht 5\' 5"  (1.651 m)   Wt 118 lb 6.4 oz (53.7 kg)   SpO2 100%   BMI 19.70 kg/m        Objective:   Physical Exam  General Mental Status- Alert. General Appearance- Not in acute distress.   Skin General: Color- Normal Color. Moisture- Normal Moisture.  Neck Carotid Arteries- Normal color. Moisture- Normal Moisture. No carotid bruits. No JVD.  Chest and Lung Exam Auscultation: Breath Sounds:-Normal.  Cardiovascular Auscultation:Rythm- Regular. Murmurs & Other Heart Sounds:Auscultation of the heart reveals- No Murmurs.  Abdomen Inspection:-Inspeection Normal. Palpation/Percussion:Note:No mass. Palpation and Percussion of the abdomen reveal- Non Tender, Non Distended + BS, no rebound or guarding.    Neurologic Cranial Nerve exam:- CN III-XII intact(No nystagmus), symmetric smile. Drift Test:- No drift. Romberg Exam:- normal Heal to Toe Gait exam:-able to do but mild off balance. Finger to Nose:- Normal/Intact but rt side little slower I finger to nose Strength:- 5/5 equal and symmetric strength both upper and lower extremities.       Assessment & Plan:   Patient Instructions  Seizures Seizures since November 2024, last in May 2025.. Current medications: Depakote and Vimpat. Keppra was short-term post-discharge. - Continue Depakote DR 500 mg at night. - Continue Vimpat 100 mg twice daily. - Follow up with neurologist next week. -Get opinion on short vs long term  disability from neurologist. Think with your situation best if neurologist fills out paperwork.  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 since January. - Discuss balance issues with neurologist during follow-up. See if they recommend PT.  Ischemic strokes Re-evaluated ischemic stroke diagnosis. February 2025 MRI/MRA normal, January 2025 MRI showed old cerebellar and lacunar infarctions. Aspirin therapy recommended. - Start aspirin 81 mg daily per last DC summary - Continue atorvastatin  40 mg daily.  Depression and anxiety -rx prozac . Start med.  -discussed level of depression and anxiety. -if any constant thoughts harm to self(others) or plan be seen in ED. Discussed extensively and in agreeement. -hopes are can get fmla/disability and stress related to job will resolve.  Follow up with me in 2 weeks or sooner if needed   Time spent with patient today was 45  minutes which consisted of chart review(various hospital visits), discussing diagnosis, work up treatment and documentation.

## 2023-06-13 NOTE — Patient Instructions (Addendum)
 Seizures Seizures since November 2024, last in May 2025.. Current medications: Depakote and Vimpat. Keppra was short-term post-discharge. - Continue Depakote DR 500 mg at night. - Continue Vimpat 100 mg twice daily. - Follow up with neurologist next week. -Get opinion on short vs long term disability from neurologist. Think with your situation best if neurologist fills out paperwork.  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 since January. - Discuss balance issues with neurologist during follow-up. See if they recommend PT.  Ischemic strokes Re-evaluated ischemic stroke diagnosis. February 2025 MRI/MRA normal, January 2025 MRI showed old cerebellar and lacunar infarctions. Aspirin therapy recommended. - Start aspirin 81 mg daily per last DC summary - Continue atorvastatin  40 mg daily.  Depression and anxiety -rx prozac . Start med.  -discussed level of depression and anxiety. -if any constant thoughts harm to self(others) or plan be seen in ED. Discussed extensively and in agreeement. -hopes are can get fmla/disability and stress related to job will resolve.  Follow up with me in 2 weeks or sooner if needed

## 2023-06-28 ENCOUNTER — Ambulatory Visit (INDEPENDENT_AMBULATORY_CARE_PROVIDER_SITE_OTHER): Admitting: Medical

## 2023-06-28 VITALS — BP 133/74 | HR 59 | Temp 98.4°F | Resp 16 | Ht 65.0 in | Wt 121.2 lb

## 2023-06-28 DIAGNOSIS — Z87891 Personal history of nicotine dependence: Secondary | ICD-10-CM

## 2023-06-28 DIAGNOSIS — R350 Frequency of micturition: Secondary | ICD-10-CM | POA: Diagnosis not present

## 2023-06-28 DIAGNOSIS — R739 Hyperglycemia, unspecified: Secondary | ICD-10-CM | POA: Diagnosis not present

## 2023-06-28 DIAGNOSIS — R5383 Other fatigue: Secondary | ICD-10-CM

## 2023-06-28 DIAGNOSIS — E785 Hyperlipidemia, unspecified: Secondary | ICD-10-CM

## 2023-06-28 LAB — CBC WITH DIFFERENTIAL/PLATELET
Absolute Lymphocytes: 2419 {cells}/uL (ref 850–3900)
Absolute Monocytes: 554 {cells}/uL (ref 200–950)
Basophils Absolute: 39 {cells}/uL (ref 0–200)
Basophils Relative: 0.7 %
Eosinophils Absolute: 62 {cells}/uL (ref 15–500)
Eosinophils Relative: 1.1 %
HCT: 42.4 % (ref 38.5–50.0)
Hemoglobin: 13.6 g/dL (ref 13.2–17.1)
MCH: 31.1 pg (ref 27.0–33.0)
MCHC: 32.1 g/dL (ref 32.0–36.0)
MCV: 96.8 fL (ref 80.0–100.0)
MPV: 10.4 fL (ref 7.5–12.5)
Monocytes Relative: 9.9 %
Neutro Abs: 2526 {cells}/uL (ref 1500–7800)
Neutrophils Relative %: 45.1 %
Platelets: 189 10*3/uL (ref 140–400)
RBC: 4.38 10*6/uL (ref 4.20–5.80)
RDW: 12.7 % (ref 11.0–15.0)
Total Lymphocyte: 43.2 %
WBC: 5.6 10*3/uL (ref 3.8–10.8)

## 2023-06-28 LAB — VITAMIN B12: Vitamin B-12: 782 pg/mL (ref 200–1100)

## 2023-06-28 MED ORDER — ATORVASTATIN CALCIUM 40 MG PO TABS
40.0000 mg | ORAL_TABLET | Freq: Every day | ORAL | 3 refills | Status: AC
Start: 2023-06-28 — End: ?

## 2023-06-28 NOTE — Progress Notes (Signed)
 Subjective:    Patient ID: [name], male    DOB: 1970/02/26, 53 y.o.   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 He clearly is under treated for his seizures, given his electrographic seizures on EEG. I am worried that compliance may be a bigger problem because of his financial situation. We will put him back on Keppra as I think it is unlikely to be the cause of his anxiety and I would like him maximized on one med. Vimpat 100 bid and DPA DR(which only last 8-10h) is too low a dose. We will increase him to 2000mg  of Keppra bid. Given his hx of electrographic or at last subtle clinical seizures we will likely have to repeat a prolonged EEG but I will hold off on this for now. With respect to his staring spells at night, these could still possibly be seizures, but it seems less likely since the EEG did not show a clear change around the time he had them. However, a subtle electrographic change might not have been seen. Hx of cerebral infarctions - continues on aspirin  81mg . MRA h/n negative. Will need to make sure he continues his aspirin . Possible pituitary adenoma - he has yet to have a hormonal pituitary adenoma screening done. We will have to get this done at his next visit. Anxiety and depression - I would like to get him on an SSRI, but I think he is having difficulty just filling his Keppra so will not add for now. Employment - he has employment restrictions due to not being able to climb heights, drive. He feels unable to work more than 4 hours per day. I will talk to Nunzio Belch about a voc rehab referral.  Return in about 4 months (around 10/21/2023).    Discussed the use of AI scribe software for clinical note transcription with the patient, who gave verbal consent to proceed.  History of Present Illness   [name] is a 53 year old male with a  history of seizures and  strokes dx in 2024 per pt who presents for follow-up after medication adjustment.  He has a history of seizures, with the most recent episode occurring in early May. He experiences focal seizures, recorded twice at work and once at home. Previously on Vimpat and Depakote, these medications were discontinued, and his treatment was adjusted to Keppra, 1000 mg twice daily. Despite the adjustment, he continues to experience seizures intermittently. He is currently not working due to his condition.  He has a history of ischemic strokes and was previously on atorvastatin  40 mg daily for hyperlipidemia, which he stopped taking after it was not refilled. He has a history of elevated blood sugar levels, and further testing is recommended to rule out diabetes. He reports frequent urination and fatigue, and experiences occasional nausea and stomach discomfort, which he associates with the increased dose of Keppra.  He has a past medical history of Hodgkin's lymphoma treated 20 years ago and sarcoidosis diagnosed around the same time. He reports occasional shortness of breath but no recent respiratory issues, as his last chest ct in April 2024 was clear. He smoked for about 7 years, quitting nearly a year ago, and previously smoked two packs a day.  He consumes a diet high in sugar and candy. He experiences fatigue and has noticed some weight loss, which he attributes to his high sugar  intake. He has been slightly anemic in the past, with a hemoglobin level of 13.4, and has not had recent B12 level testing.       Review of Systems  Constitutional:  Negative for chills, fatigue and fever.  Respiratory:  Negative for cough, chest tightness, shortness of breath and wheezing.   Cardiovascular:  Negative for chest pain and palpitations.  Gastrointestinal:  Negative for abdominal pain, blood in stool and nausea.  Musculoskeletal:  Negative for back pain and myalgias.  Skin:  Negative for  rash.  Neurological:  Negative for dizziness, syncope, weakness and numbness.  Hematological:  Negative for adenopathy.  Psychiatric/Behavioral:  Positive for dysphoric mood. Negative for behavioral problems and suicidal ideas. The patient is nervous/anxious.    See hpi    Objective:   Physical Exam  General Mental Status- Alert. General Appearance- Not in acute distress.   Skin General: Color- Normal Color. Moisture- Normal Moisture.  Neck Carotid Arteries- Normal color. Moisture- Normal Moisture. No carotid bruits. No JVD.  Chest and Lung Exam Auscultation: Breath Sounds:-Normal.  Cardiovascular Auscultation:Rythm- Regular. Murmurs & Other Heart Sounds:Auscultation of the heart reveals- No Murmurs.  Abdomen Inspection:-Inspeection Normal. Palpation/Percussion:Note:No mass. Palpation and Percussion of the abdomen reveal- Non Tender, Non Distended + BS, no rebound or guarding.    Neurologic Cranial Nerve exam:- CN III-XII intact(No nystagmus), symmetric smile. Strength:- 5/5 equal and symmetric strength both upper and lower extremities.       Assessment & Plan:   Assessment and Plan    Focal Epilepsy Focal epilepsy with recent seizures on EEG. Neurologist adjusted medication to Keppra 1000 mg twice daily. Reports continued localized seizures, no generalized seizures. Emphasized medication compliance. Financial constraints addressed with a 70-month supply of medication. - Continue Keppra 1000 mg twice daily. - Ensure compliance with seizure medication. - Follow up with neurologist as needed.  Elevated sugar Elevated blood glucose levels and frequent urination. Suggested evaluation for diabetes due to dietary habits and elevated sugar levels during hospitalization. - Order Hemoglobin A1c to assess average blood glucose levels. - Perform urinalysis to evaluate for glucose and other abnormalities. - Order TSH and T4 to evaluate thyroid  function.  Anemia Slight  anemia with hemoglobin at 13.4 g/dL. No recent B12 level assessment. Recent labs showed slight anemia, prompting further evaluation. - Repeat CBC to assess current hemoglobin levels. - Order iron panel to evaluate iron status. - Order B12 level to assess for vitamin B12 deficiency.  Hyperlipidemia Hyperlipidemia with previous atorvastatin  40 mg daily. Medication not refilled, history of ischemic strokes noted. No recent cholesterol level assessment. - Order cholesterol level to assess current lipid status. - Reinstate atorvastatin  40 mg daily if indicated.  General Health Maintenance Smoking history of 14 pack-years, quit nearly a year ago. Discussed potential health impacts. Recent CT scan of chest was clear. - Order PSA to evaluate prostate health.       Frequent urination.(Mild) -Check poct ua and psa.  Anxiety and depression  -improved with fluoxetine   Follow up in one month or sooner if needed   Time spent with patient today was 40  minutes which consisted of chart revdew, discussing diagnosis, work up treatment and documentation.

## 2023-06-28 NOTE — Patient Instructions (Addendum)
 Focal Epilepsy Focal epilepsy with recent seizures on EEG. Neurologist adjusted medication to Keppra 1000 mg twice daily. Reports continued localized seizures, no generalized seizures. Emphasized medication compliance. Financial constraints addressed with a 86-month supply of medication. - Continue Keppra 1000 mg twice daily. - Ensure compliance with seizure medication. - Follow up with neurologist as needed.  Elevated sugar Elevated blood glucose levels and frequent urination. Suggested evaluation for diabetes due to dietary habits and elevated sugar levels during hospitalization. - Order Hemoglobin A1c to assess average blood glucose levels. - Perform urinalysis to evaluate for glucose and other abnormalities. - Order TSH and T4 to evaluate thyroid  function.  Anemia Slight anemia with hemoglobin at 13.4 g/dL. No recent B12 level assessment. Recent labs showed slight anemia, prompting further evaluation. - Repeat CBC to assess current hemoglobin levels. - Order iron panel to evaluate iron status. - Order B12 level to assess for vitamin B12 deficiency.  Hyperlipidemia Hyperlipidemia with previous atorvastatin  40 mg daily. Medication not refilled, history of ischemic strokes noted. No recent cholesterol level assessment. - Order cholesterol level to assess current lipid status. - Reinstate atorvastatin  40 mg daily if indicated.  -General Health Maintenance Smoking history of 14 pack-years, quit nearly a year ago. Discussed potential health impacts. Recent CT scan of chest was clear. - Order PSA to evaluate prostate health.  Anxiety and depression  -improved with fluoxetine 

## 2023-06-29 ENCOUNTER — Ambulatory Visit: Payer: Self-pay | Admitting: Medical

## 2023-06-29 LAB — IRON,TIBC AND FERRITIN PANEL
%SAT: 36 % (ref 20–48)
Ferritin: 73 ng/mL (ref 38–380)
Iron: 117 ug/dL (ref 50–180)
TIBC: 328 ug/dL (ref 250–425)

## 2023-06-29 LAB — HEMOGLOBIN A1C
Hgb A1c MFr Bld: 4.9 % (ref <5.7)
Mean Plasma Glucose: 94 mg/dL
eAG (mmol/L): 5.2 mmol/L

## 2023-06-29 LAB — TSH: TSH: 1.21 m[IU]/L (ref 0.40–4.50)

## 2023-06-29 LAB — PSA: PSA: 1.22 ng/mL (ref <=4.00)

## 2023-06-29 LAB — T4, FREE: Free T4: 1.1 ng/dL (ref 0.8–1.8)

## 2023-07-29 ENCOUNTER — Ambulatory Visit: Admitting: Medical
